# Patient Record
Sex: Female | Born: 1985 | Race: Black or African American | Hispanic: No | Marital: Married | Smoking: Never smoker
Health system: Southern US, Community
[De-identification: ages and names within clinical notes are randomized; demographics above are authoritative.]

## PROBLEM LIST (undated history)

## (undated) DIAGNOSIS — E611 Iron deficiency: Secondary | ICD-10-CM

## (undated) DIAGNOSIS — F419 Anxiety disorder, unspecified: Secondary | ICD-10-CM

## (undated) DIAGNOSIS — D352 Benign neoplasm of pituitary gland: Secondary | ICD-10-CM

## (undated) DIAGNOSIS — D649 Anemia, unspecified: Secondary | ICD-10-CM

## (undated) DIAGNOSIS — R7989 Other specified abnormal findings of blood chemistry: Secondary | ICD-10-CM

## (undated) DIAGNOSIS — J302 Other seasonal allergic rhinitis: Secondary | ICD-10-CM

## (undated) DIAGNOSIS — M7732 Calcaneal spur, left foot: Secondary | ICD-10-CM

## (undated) DIAGNOSIS — D573 Sickle-cell trait: Secondary | ICD-10-CM

## (undated) DIAGNOSIS — F32A Depression, unspecified: Secondary | ICD-10-CM

## (undated) DIAGNOSIS — K589 Irritable bowel syndrome without diarrhea: Secondary | ICD-10-CM

## (undated) DIAGNOSIS — E559 Vitamin D deficiency, unspecified: Secondary | ICD-10-CM

## (undated) HISTORY — PX: ACHILLES TENDON LENGTHENING: SUR826

## (undated) HISTORY — DX: Morbid (severe) obesity due to excess calories: E66.01

## (undated) HISTORY — PX: BLEPHAROPLASTY: SUR158

## (undated) HISTORY — DX: Sickle-cell trait: D57.3

## (undated) HISTORY — DX: Calcaneal spur, left foot: M77.32

## (undated) HISTORY — PX: HEEL SPUR RESECTION: SHX6410

## (undated) HISTORY — DX: Irritable bowel syndrome, unspecified: K58.9

## (undated) HISTORY — DX: Vitamin D deficiency, unspecified: E55.9

## (undated) HISTORY — PX: OTHER SURGICAL HISTORY: SHX169

## (undated) HISTORY — DX: Other specified abnormal findings of blood chemistry: R79.89

## (undated) HISTORY — DX: Benign neoplasm of pituitary gland: D35.2

## (undated) HISTORY — DX: Anemia, unspecified: D64.9

## (undated) HISTORY — DX: Iron deficiency: E61.1

---

## 2013-07-25 ENCOUNTER — Encounter: Payer: Self-pay | Admitting: Internal Medicine

## 2014-01-04 HISTORY — PX: CHOLECYSTECTOMY: SHX55

## 2014-08-05 DIAGNOSIS — D352 Benign neoplasm of pituitary gland: Secondary | ICD-10-CM

## 2014-08-05 HISTORY — DX: Benign neoplasm of pituitary gland: D35.2

## 2016-06-06 ENCOUNTER — Encounter (HOSPITAL_COMMUNITY): Payer: Self-pay | Admitting: Emergency Medicine

## 2016-06-06 ENCOUNTER — Emergency Department (HOSPITAL_COMMUNITY)
Admission: EM | Admit: 2016-06-06 | Discharge: 2016-06-06 | Disposition: A | Discharge status: Home / Self Care | Payer: Self-pay | Attending: Emergency Medicine | Admitting: Emergency Medicine

## 2016-06-06 DIAGNOSIS — H81399 Other peripheral vertigo, unspecified ear: Secondary | ICD-10-CM | POA: Insufficient documentation

## 2016-06-06 LAB — I-STAT BETA HCG BLOOD, ED (MC, WL, AP ONLY): I-stat hCG, quantitative: <5 m[IU]/mL (ref <5)

## 2016-06-06 MED ORDER — MECLIZINE HCL 25 MG PO TABS: 25.0000 mg | ORAL_TABLET | Freq: Three times a day (TID) | ORAL | 0 refills | Status: DC | PRN

## 2016-06-06 MED ORDER — MECLIZINE HCL 25 MG PO TABS
50.0000 mg | ORAL_TABLET | Freq: Once | ORAL | Status: AC
  Administered 2016-06-06: 50 mg via ORAL
  Filled 2016-06-06: qty 2

## 2016-06-06 NOTE — ED Notes (Signed)
Pt from home with complaints of dizziness that began today, but got worse when she got off work around 1300. Pt denies symptoms while she is sitting. Pt states when she walks the room feels like it is spinning, but she is unable to identify a direction that the room is spinning. Pt thinks she may be pregnant.

## 2016-06-06 NOTE — Discharge Instructions (Signed)
Do not drive until your symptoms are resolved. Get help right away if: You have difficulty moving or speaking. You are always dizzy. You faint. You develop severe headaches. You have weakness in your hands, arms, or legs. You have changes in your hearing or vision. You develop a stiff neck. You develop sensitivity to light.

## 2016-06-06 NOTE — ED Provider Notes (Signed)
[facility] DEPT Provider Note   CSN: [id number] Arrival date & time: 06/06/16  1821     History   Chief Complaint Chief Complaint  Patient presents with  . Dizziness    HPI Ana Padilla is a 31 y.o. female who presents with sxs of vertigo. She has never had anything like this before. She had onset of positional vertigo at about 1:00PM that has been progressively worsening since then. Symptoms are improved when lying flat worse with sitting up or standing. She has nausea without vomiting and has been able to ambulate.  HPI  History reviewed. No pertinent past medical history.  There are no active problems to display for this patient.   Past Surgical History:  Procedure Laterality Date  . CHOLECYSTECTOMY      OB History    No data available       Home Medications    Prior to Admission medications   Not on File    Family History No family history on file.  Social History Social History  Substance Use Topics  . Smoking status: Never Smoker  . Smokeless tobacco: Never Used  . Alcohol use Yes     Comment: socially      Allergies   Patient has no known allergies.   Review of Systems Review of Systems  Ten systems reviewed and are negative for acute change, except as noted in the HPI.   Physical Exam Updated Vital Signs BP (!) 139/95 (BP Location: Left Arm)   Pulse 75   Temp 98.5 F (36.9 C) (Oral)   Resp 16   Ht 5\' 7"  (1.702 m)   Wt (!) 156.5 kg (345 lb)   LMP 05/24/2016   SpO2 99%   BMI 54.03 kg/m   Physical Exam  Constitutional: She is oriented to person, place, and time. She appears well-developed and well-nourished. No distress.  HENT:  Head: Normocephalic and atraumatic.  Eyes: Conjunctivae are normal. No scleral icterus.  Neck: Normal range of motion.  Cardiovascular: Normal rate, regular rhythm and normal heart sounds.  Exam reveals no gallop and no friction rub.   No murmur heard. Pulmonary/Chest: Effort normal and breath  sounds normal. No respiratory distress.  Abdominal: Soft. Bowel sounds are normal. She exhibits no distension and no mass. There is no tenderness. There is no guarding.  Neurological: She is alert and oriented to person, place, and time.  Speech is clear and goal oriented, follows commands Major Cranial nerves without deficit, no facial droop Normal strength in upper and lower extremities bilaterally including dorsiflexion and plantar flexion, strong and equal grip strength Sensation normal to light and sharp touch Moves extremities without ataxia, coordination intact Normal finger to nose and rapid alternating movements Neg romberg, no pronator drift Normal gait Normal heel-shin and balance   Skin: Skin is warm and dry. She is not diaphoretic.  Psychiatric: Her behavior is normal.  Nursing note and vitals reviewed.    ED Treatments / Results  Labs (all labs ordered are listed, but only abnormal results are displayed) Labs Reviewed  I-STAT BETA HCG BLOOD, ED ([facility], [facility], [facility] ONLY)    EKG  EKG Interpretation None       Radiology No results found.  Procedures Procedures (including critical care time)  Medications Ordered in ED Medications - No data to display   Initial Impression / Assessment and Plan / ED Course  I have reviewed the triage vital signs and the nursing notes.  Pertinent labs & imaging results that  were available during my care of the patient were reviewed by me and considered in my medical decision making (see chart for details).     Patient with positional vertigo greatly improved after administration of meclizine. Patient's EKG negative for A. fib. I have extremely low suspicion for central cause is her vertigo is positional in nature. She also has very low risk factors for stroke. Patient is able to ambulate in the emergency department. No vomiting. She continues to have a slight sensation of vertigo with movement of the head off the changing of position  from lying to standing. Patient feels greatly improved. She will be discharged with meclizine. I discussed return precautions. She appears safe for discharge at this time  Final Clinical Impressions(s) / ED Diagnoses   Final diagnoses:  Peripheral vertigo, unspecified laterality    New Prescriptions New Prescriptions   No medications on file     Margarita Mail, PA-C 06/06/16 [place], [place], MD 06/11/16 [phone]

## 2016-09-11 DIAGNOSIS — H52223 Regular astigmatism, bilateral: Secondary | ICD-10-CM | POA: Diagnosis not present

## 2016-09-20 ENCOUNTER — Ambulatory Visit (INDEPENDENT_AMBULATORY_CARE_PROVIDER_SITE_OTHER): Payer: 59 | Admitting: Family Medicine

## 2016-09-20 ENCOUNTER — Other Ambulatory Visit (INDEPENDENT_AMBULATORY_CARE_PROVIDER_SITE_OTHER): Payer: 59

## 2016-09-20 ENCOUNTER — Ambulatory Visit: Payer: 59 | Admitting: Women's Health

## 2016-09-20 ENCOUNTER — Ambulatory Visit: Payer: Self-pay | Admitting: Family Medicine

## 2016-09-20 ENCOUNTER — Encounter: Payer: Self-pay | Admitting: Family Medicine

## 2016-09-20 VITALS — BP 110/80 | HR 86 | Ht 67.0 in | Wt 354.4 lb

## 2016-09-20 DIAGNOSIS — R635 Abnormal weight gain: Secondary | ICD-10-CM | POA: Diagnosis not present

## 2016-09-20 DIAGNOSIS — R300 Dysuria: Secondary | ICD-10-CM | POA: Diagnosis not present

## 2016-09-20 DIAGNOSIS — K580 Irritable bowel syndrome with diarrhea: Secondary | ICD-10-CM | POA: Diagnosis not present

## 2016-09-20 DIAGNOSIS — Z131 Encounter for screening for diabetes mellitus: Secondary | ICD-10-CM | POA: Diagnosis not present

## 2016-09-20 DIAGNOSIS — D649 Anemia, unspecified: Secondary | ICD-10-CM

## 2016-09-20 DIAGNOSIS — Z13220 Encounter for screening for lipoid disorders: Secondary | ICD-10-CM | POA: Diagnosis not present

## 2016-09-20 DIAGNOSIS — Z8639 Personal history of other endocrine, nutritional and metabolic disease: Secondary | ICD-10-CM

## 2016-09-20 DIAGNOSIS — N760 Acute vaginitis: Secondary | ICD-10-CM

## 2016-09-20 DIAGNOSIS — Z7689 Persons encountering health services in other specified circumstances: Secondary | ICD-10-CM

## 2016-09-20 DIAGNOSIS — Z86018 Personal history of other benign neoplasm: Secondary | ICD-10-CM

## 2016-09-20 LAB — CBC WITH DIFFERENTIAL/PLATELET
Basophils Absolute: 0 10*3/uL (ref 0.0–0.1)
Basophils Relative: 0.3 % (ref 0.0–3.0)
Eosinophils Absolute: 0.2 10*3/uL (ref 0.0–0.7)
Eosinophils Relative: 2.8 % (ref 0.0–5.0)
HCT: 31.8 % — ABNORMAL LOW (ref 36.0–46.0)
Hemoglobin: 9.9 g/dL — ABNORMAL LOW (ref 12.0–15.0)
Lymphocytes Relative: 21.2 % (ref 12.0–46.0)
Lymphs Abs: 1.6 10*3/uL (ref 0.7–4.0)
MCHC: 31.3 g/dL (ref 30.0–36.0)
MCV: 62.7 fl — ABNORMAL LOW (ref 78.0–100.0)
Monocytes Absolute: 0.4 10*3/uL (ref 0.1–1.0)
Monocytes Relative: 5.9 % (ref 3.0–12.0)
Neutro Abs: 5.2 10*3/uL (ref 1.4–7.7)
Neutrophils Relative %: 69.8 % (ref 43.0–77.0)
Platelets: 445 10*3/uL — ABNORMAL HIGH (ref 150.0–400.0)
RBC: 5.07 Mil/uL (ref 3.87–5.11)
RDW: 17.7 % — ABNORMAL HIGH (ref 11.5–15.5)
WBC: 7.5 10*3/uL (ref 4.0–10.5)

## 2016-09-20 LAB — COMPREHENSIVE METABOLIC PANEL
ALT: 12 U/L (ref 0–35)
AST: 15 U/L (ref 0–37)
Albumin: 3.9 g/dL (ref 3.5–5.2)
Alkaline Phosphatase: 65 U/L (ref 39–117)
BUN: 8 mg/dL (ref 6–23)
CO2: 29 mEq/L (ref 19–32)
Calcium: 9.3 mg/dL (ref 8.4–10.5)
Chloride: 100 mEq/L (ref 96–112)
Creatinine, Ser: 0.72 mg/dL (ref 0.40–1.20)
GFR: 122.82 mL/min (ref >60.00)
Glucose, Bld: 96 mg/dL (ref 70–99)
Potassium: 3.9 mEq/L (ref 3.5–5.1)
Sodium: 138 mEq/L (ref 135–145)
Total Bilirubin: 0.4 mg/dL (ref 0.2–1.2)
Total Protein: 7.3 g/dL (ref 6.0–8.3)

## 2016-09-20 LAB — LIPID PANEL
Cholesterol: 198 mg/dL (ref 0–200)
HDL: 79.4 mg/dL (ref >39.00)
LDL Cholesterol: 92 mg/dL (ref 0–99)
NonHDL: 118.57
Total CHOL/HDL Ratio: 2
Triglycerides: 134 mg/dL (ref 0.0–149.0)
VLDL: 26.8 mg/dL (ref 0.0–40.0)

## 2016-09-20 LAB — POC URINALSYSI DIPSTICK (AUTOMATED)
Bilirubin, UA: NEGATIVE
Blood, UA: NEGATIVE
Glucose, UA: NEGATIVE
Ketones, UA: NEGATIVE
Leukocytes, UA: NEGATIVE
Nitrite, UA: NEGATIVE
Protein, UA: NEGATIVE
Spec Grav, UA: >=1.03 — AB (ref 1.010–1.025)
Urobilinogen, UA: 0.2 E.U./dL
pH, UA: 5.5 (ref 5.0–8.0)

## 2016-09-20 LAB — T4, FREE: Free T4: 0.68 ng/dL (ref 0.60–1.60)

## 2016-09-20 LAB — VITAMIN D 25 HYDROXY (VIT D DEFICIENCY, FRACTURES): VITD: 20.41 ng/mL — ABNORMAL LOW (ref 30.00–100.00)

## 2016-09-20 LAB — HEMOGLOBIN A1C: Hgb A1c MFr Bld: 5.6 % (ref 4.6–6.5)

## 2016-09-20 LAB — VITAMIN B12: Vitamin B-12: 265 pg/mL (ref 211–911)

## 2016-09-20 LAB — FOLATE: Folate: 6.4 ng/mL (ref >5.9)

## 2016-09-20 LAB — TSH: TSH: 1.19 u[IU]/mL (ref 0.35–4.50)

## 2016-09-20 MED ORDER — PROBIOTIC DAILY PO CAPS: 1.0000 | ORAL_CAPSULE | Freq: Every day | ORAL | 3 refills | Status: DC

## 2016-09-20 MED ORDER — FLUCONAZOLE 150 MG PO TABS: 150.0000 mg | ORAL_TABLET | Freq: Once | ORAL | 0 refills | Status: AC

## 2016-09-20 MED ORDER — METRONIDAZOLE 500 MG PO TABS: 500.0000 mg | ORAL_TABLET | Freq: Two times a day (BID) | ORAL | 0 refills | Status: AC

## 2016-09-20 NOTE — Progress Notes (Signed)
Patient presents to clinic today to establish care and follow up on chronic conditions.  SUBJECTIVE: PMH: Pt is a 31 yo AAF with pmh sig for Pituitary adenoma, anemia, IBS-D, and chronic yeast/BV infections.   Pt was formerly seen by [facility] in [place].    Pituitary adenoma: -Diagnosed in 2017? -Had increased prolactin. -Formerly on Cabergoline -Denies visual disturbances, headaches, nausea vomiting, increased growth.  Chronic vaginitis -endorses frequent BV/yeast infections. 1 q 3 months. -Endorses current vaginal discomfort, mild itching and malodorous d/c. -Denies burning, frequent urination, abd pain, cottage cheese like d/c -was taking a probiotic daily with some relief. -Endorses wearing cotton panties, is sexually active with 1 partner., denies use of sex toys. -Has Rx for metronidazole gel and Diflucan from previous provider.   MetroGel was too expensive at pharmacy.  H/o chronic anemia: -on BID iron supplementation.  Tries to drink a glass of orage juice with pills -issues noted s/p cholecystectomy -No issues with constipation, palpitations -Ask about possibility of Iron infusion as levels stay low and extremely low during menses.  IBS-D: -asking for a referral to GI -not currently on meds.  Weight concern: -pt wants to lose wt. -gained 40lbs since moving here  -was exercising for 2 weeks then stopped 2/2 recent weather. -trying to drink more water.  Allergies: -Pt thinks has allergy to the pesticides on fresh fruit- itchy throat and ears.  PSurgHx: -cholecystectomy  Social hx: Pt recently moved from [place].  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  Pt is currently in a monogamous relationship.  Endorses condom use.  Pt endorses heavy regular menses.  Pt has tried numerous birth control options all with continued bleeding.  Birth control ortho novo, ortho tricyclene, the patch, Loestrin, Seasonique, and IUD 2. Pt denies tobacco,  EtOH, Drug use.    FMHx: -pt was adopted.  States mother has Sickle Cell.   Past Medical History:  Diagnosis Date  . Adenoma of pituitary ([place]) 08/2014    Past Surgical History:  Procedure Laterality Date  . CHOLECYSTECTOMY      Current Outpatient Prescriptions on File Prior to Visit  Medication Sig Dispense Refill  . cetirizine (ZYRTEC) 10 MG tablet Take 10 mg by mouth daily at 10 pm.     No current facility-administered medications on file prior to visit.     Allergies  Allergen Reactions  . Food     Seasonal Allergies & fresh fruit---itchy mouth&nose     Family History  Problem Relation Age of Onset  . Sickle cell trait Mother   . Heart disease Father     Social History   Social History  . Marital status: Single    Spouse name: N/A  . Number of children: N/A  . Years of education: N/A   Occupational History  . Not on file.   Social History Main Topics  . Smoking status: Never Smoker  . Smokeless tobacco: Never Used  . Alcohol use Yes     Comment: socially   . Drug use: No  . Sexual activity: Not on file   Other Topics Concern  . Not on file   Social History Narrative  . No narrative on file    ROS  General: Denies fever, chills, night sweats, changes in weight, changes in appetite.  +anemia, increased wt HEENT: Denies headaches, ear pain, changes in vision, rhinorrhea, sore throat CV: Denies CP, palpitations, SOB, orthopnea Pulm: Denies SOB, cough, wheezing GI: Denies abdominal  pain, nausea, vomiting, constipation  +IBS with diarrhea GU: Denies dysuria, hematuria, frequency, vaginal discharge Msk: Denies muscle cramps, joint pains Neuro: Denies weakness, numbness, tingling Skin: Denies rashes, bruising Psych: Denies depression, anxiety, hallucinations   BP 110/80 (BP Location: Left Arm, Patient Position: Sitting, Cuff Size: Large)   Pulse 86   Ht 5\' 7"  (1.702 m)   Wt (!) 354 lb 6.4 oz (160.8 kg)   LMP 06/08/2016 (Approximate)   BMI  55.51 kg/m   Physical Exam  Gen. Pleasant, obese, well developed, well-nourished, in NAD. HEENT - [place]/AT, PERRL, no scleral icterus, no nasal drainage, pharynx without erythema or exudate. Neck: No JVD, no thyromegaly, no carotid bruits Lungs: no accessory muscle use, CTAB, no wheezes, rales or rhonchi Cardiovascular: RRR, No r/g/m, no peripheral edema Abdomen: BS present, soft, nontender,nondistended, no hepatosplenomegaly Musculoskeletal: No deformities, moves all four extremities, no cyanosis or clubbing, normal tone Neuro:  A&Ox3, CN II-XII intact, normal gait Skin:  Warm, dry, intact, no lesions Psych: normal affect, mood appropriate  Recent Results (from the past 2160 hour(s))  POCT Urinalysis Dipstick (Automated)     Status: Abnormal   Collection Time: 09/20/16 10:09 AM  Result Value Ref Range   Color, UA yellow    Clarity, UA clear    Glucose, UA neg    Bilirubin, UA neg    Ketones, UA neg    Spec Grav, UA >=1.030 (A) 1.010 - 1.025   Blood, UA neg    pH, UA 5.5 5.0 - 8.0   Protein, UA neg    Urobilinogen, UA 0.2 0.2 or 1.0 E.U./dL   Nitrite, UA neg    Leukocytes, UA Negative Negative  Hemoglobin A1c     Status: None   Collection Time: 09/20/16 11:10 AM  Result Value Ref Range   Hgb A1c MFr Bld 5.6 4.6 - 6.5 %    Comment: Glycemic Control Guidelines for People with Diabetes:Non Diabetic:  <6%Goal of Therapy: <7%Additional Action Suggested:  >8%   Lipid panel     Status: None   Collection Time: 09/20/16 11:10 AM  Result Value Ref Range   Cholesterol 198 0 - 200 mg/dL    Comment: ATP III Classification       Desirable:  < 200 mg/dL               Borderline High:  200 - 239 mg/dL          High:  > = 240 mg/dL   Triglycerides 134.0 0.0 - 149.0 mg/dL    Comment: Normal:  <150 mg/dLBorderline High:  150 - 199 mg/dL   HDL 79.40 >39.00 mg/dL   VLDL 26.8 0.0 - 40.0 mg/dL   LDL Cholesterol 92 0 - 99 mg/dL   Total CHOL/HDL Ratio 2     Comment:                Men           Women1/2 Average Risk     3.4          3.3Average Risk          5.0          4.42X Average Risk          9.6          7.13X Average Risk          15.0          11.0  NonHDL 118.57     Comment: NOTE:  Non-HDL goal should be 30 mg/dL higher than patient's LDL goal (i.e. LDL goal of < 70 mg/dL, would have non-HDL goal of < 100 mg/dL)  CBC with Differential/Platelet     Status: Abnormal   Collection Time: 09/20/16 11:10 AM  Result Value Ref Range   WBC 7.5 4.0 - 10.5 K/uL   RBC 5.07 3.87 - 5.11 Mil/uL   Hemoglobin 9.9 (L) 12.0 - 15.0 g/dL   HCT 31.8 (L) 36.0 - 46.0 %   MCV 62.7 Repeated and verified X2. (L) 78.0 - 100.0 fl   MCHC 31.3 30.0 - 36.0 g/dL   RDW 17.7 (H) 11.5 - 15.5 %   Platelets 445.0 (H) 150.0 - 400.0 K/uL   Neutrophils Relative % 69.8 43.0 - 77.0 %   Lymphocytes Relative 21.2 12.0 - 46.0 %   Monocytes Relative 5.9 3.0 - 12.0 %   Eosinophils Relative 2.8 0.0 - 5.0 %   Basophils Relative 0.3 0.0 - 3.0 %   Neutro Abs 5.2 1.4 - 7.7 K/uL   Lymphs Abs 1.6 0.7 - 4.0 K/uL   Monocytes Absolute 0.4 0.1 - 1.0 K/uL   Eosinophils Absolute 0.2 0.0 - 0.7 K/uL   Basophils Absolute 0.0 0.0 - 0.1 K/uL  Folate     Status: None   Collection Time: 09/20/16 11:10 AM  Result Value Ref Range   Folate 6.4 >5.9 ng/mL  Vitamin B12     Status: None   Collection Time: 09/20/16 11:10 AM  Result Value Ref Range   Vitamin B-12 265 211 - 911 pg/mL  Comprehensive metabolic panel     Status: None   Collection Time: 09/20/16 11:10 AM  Result Value Ref Range   Sodium 138 135 - 145 mEq/L   Potassium 3.9 3.5 - 5.1 mEq/L   Chloride 100 96 - 112 mEq/L   CO2 29 19 - 32 mEq/L   Glucose, Bld 96 70 - 99 mg/dL   BUN 8 6 - 23 mg/dL   Creatinine, Ser 0.72 0.40 - 1.20 mg/dL   Total Bilirubin 0.4 0.2 - 1.2 mg/dL   Alkaline Phosphatase 65 39 - 117 U/L   AST 15 0 - 37 U/L   ALT 12 0 - 35 U/L   Total Protein 7.3 6.0 - 8.3 g/dL   Albumin 3.9 3.5 - 5.2 g/dL   Calcium 9.3 8.4 - 10.5 mg/dL    GFR 122.82 >60.00 mL/min  Vitamin D, 25-hydroxy     Status: Abnormal   Collection Time: 09/20/16 11:10 AM  Result Value Ref Range   VITD 20.41 (L) 30.00 - 100.00 ng/mL    Assessment/Plan: History of pituitary adenoma -will obtain previous records for imaging -may need to restart cabergoline.  Will await prolactin level  - Plan: Prolactin, Ambulatory referral to Endocrinology  Dysuria  - Plan: POCT Urinalysis Dipstick (Automated)  Acute vaginitis - discussed hygiene -restart probiotic - Plan: metroNIDAZOLE (FLAGYL) 500 MG tablet, fluconazole (DIFLUCAN) 150 MG tablet, Probiotic Product (PROBIOTIC DAILY) CAPS  Anemia, unspecified type  - Plan: CBC with Differential/Platelet, Folate, Vitamin B12, Vitamin D, 25-hydroxy, Vitamin D, 25-hydroxy  Screening cholesterol level  - Plan: Lipid panel, Comprehensive metabolic panel  Screening for diabetes mellitus -given increased wt and frequent infections  - Plan: Hemoglobin A1c  Encounter to establish care with new doctor -records release  Irritable bowel syndrome with diarrhea  -discussed avoiding triggers/limiting stress -restart probiotic - Plan: Ambulatory referral to Gastroenterology  Weight gain  -  40 lb wt gain sine moving to the area. -BMI 55.479 -discussed increasing intake of vegetables, increasing physical activity, and water intake. - Plan: TSH, T4, Free

## 2016-09-20 NOTE — Patient Instructions (Addendum)
We have ordered labs at this visit. It can take up to 1-2 weeks for results and processing. If results require follow up or explanation, we will call you with instructions. Clinically stable results will be released to your [facility] or sent to you via mail. If you have not heard from [place] or cannot find your results in [facility] in 2 weeks please contact our office at [phone].   Anemia, Nonspecific Anemia is a condition in which the concentration of red blood cells or hemoglobin in the blood is below normal. Hemoglobin is a substance in red blood cells that carries oxygen to the tissues of the body. Anemia results in not enough oxygen reaching these tissues. What are the causes? Common causes of anemia include:  Excessive bleeding. Bleeding may be internal or external. This includes excessive bleeding from periods (in women) or from the intestine.  Poor nutrition.  Chronic kidney, thyroid, and liver disease.  Bone marrow disorders that decrease red blood cell production.  Cancer and treatments for cancer.  HIV, AIDS, and their treatments.  Spleen problems that increase red blood cell destruction.  Blood disorders.  Excess destruction of red blood cells due to infection, medicines, and autoimmune disorders.  What are the signs or symptoms?  Minor weakness.  Dizziness.  Headache.  Palpitations.  Shortness of breath, especially with exercise.  Paleness.  Cold sensitivity.  Indigestion.  Nausea.  Difficulty sleeping.  Difficulty concentrating. Symptoms may occur suddenly or they may develop slowly. How is this diagnosed? Additional blood tests are often needed. These help your health care provider determine the best treatment. Your health care provider will check your stool for blood and look for other causes of blood loss. How is this treated? Treatment varies depending on the cause of the anemia. Treatment can include:  Supplements of iron, vitamin V77, or folic  acid.  Hormone medicines.  A blood transfusion. This may be needed if blood loss is severe.  Hospitalization. This may be needed if there is significant continual blood loss.  Dietary changes.  Spleen removal.  Follow these instructions at home: Keep all follow-up appointments. It often takes many weeks to correct anemia, and having your health care provider check on your condition and your response to treatment is very important. Get help right away if:  You develop extreme weakness, shortness of breath, or chest pain.  You become dizzy or have trouble concentrating.  You develop heavy vaginal bleeding.  You develop a rash.  You have bloody or black, tarry stools.  You faint.  You vomit up blood.  You vomit repeatedly.  You have abdominal pain.  You have a fever or persistent symptoms for more than 2-3 days.  You have a fever and your symptoms suddenly get worse.  You are dehydrated. This information is not intended to replace advice given to you by your health care provider. Make sure you discuss any questions you have with your health care provider. Document Released: 01/29/2004 Document Revised: 06/04/2015 Document Reviewed: 06/16/2012 Elsevier Interactive Patient Education  2017 [employer]  Bacterial Vaginosis Bacterial vaginosis is an infection of the vagina. It happens when too many germs (bacteria) grow in the vagina. This infection puts you at risk for infections from sex (STIs). Treating this infection can lower your risk for some STIs. You should also treat this if you are pregnant. It can cause your baby to be born early. Follow these instructions at home: Medicines  Take over-the-counter and prescription medicines only as told by  your doctor.  Take or use your antibiotic medicine as told by your doctor. Do not stop taking or using it even if you start to feel better. General instructions  If you your sexual partner is a woman, tell her that you  have this infection. She needs to get treatment if she has symptoms. If you have a female partner, he does not need to be treated.  During treatment: ? Avoid sex. ? Do not douche. ? Avoid alcohol as told. ? Avoid breastfeeding as told.  Drink enough fluid to keep your pee (urine) clear or pale yellow.  Keep your vagina and butt (rectum) clean. ? Wash the area with warm water every day. ? Wipe from front to back after you use the toilet.  Keep all follow-up visits as told by your doctor. This is important. Preventing this condition  Do not douche.  Use only warm water to wash around your vagina.  Use protection when you have sex. This includes: ? Latex condoms. ? Dental dams.  Limit how many people you have sex with. It is best to only have sex with the same person (be monogamous).  Get tested for STIs. Have your partner get tested.  Wear underwear that is cotton or lined with cotton.  Avoid tight pants and pantyhose. This is most important in summer.  Do not use any products that have nicotine or tobacco in them. These include cigarettes and e-cigarettes. If you need help quitting, ask your doctor.  Do not use illegal drugs.  Limit how much alcohol you drink. Contact a doctor if:  Your symptoms do not get better, even after you are treated.  You have more discharge or pain when you pee (urinate).  You have a fever.  You have pain in your belly (abdomen).  You have pain with sex.  Your bleed from your vagina between periods. Summary  This infection happens when too many germs (bacteria) grow in the vagina.  Treating this condition can lower your risk for some infections from sex (STIs).  You should also treat this if you are pregnant. It can cause early (premature) birth.  Do not stop taking or using your antibiotic medicine even if you start to feel better. This information is not intended to replace advice given to you by your health care provider. Make sure  you discuss any questions you have with your health care provider. Document Released: 09/30/2007 Document Revised: 09/06/2015 Document Reviewed: 09/06/2015 Elsevier Interactive Patient Education  2017 [employer].

## 2016-09-21 LAB — PROLACTIN: Prolactin: 54.6 ng/mL — ABNORMAL HIGH

## 2016-09-23 ENCOUNTER — Other Ambulatory Visit: Payer: Self-pay | Admitting: Family Medicine

## 2016-09-23 ENCOUNTER — Encounter: Payer: Self-pay | Admitting: Family Medicine

## 2016-09-23 DIAGNOSIS — E559 Vitamin D deficiency, unspecified: Secondary | ICD-10-CM

## 2016-09-23 MED ORDER — VITAMIN D (ERGOCALCIFEROL) 1.25 MG (50000 UNIT) PO CAPS: 50000.0000 [IU] | ORAL_CAPSULE | ORAL | 0 refills | Status: DC

## 2016-09-24 ENCOUNTER — Telehealth: Payer: Self-pay | Admitting: Family Medicine

## 2016-09-24 NOTE — Telephone Encounter (Signed)
Pt would like results of labs. Please call back. °

## 2016-09-24 NOTE — Telephone Encounter (Signed)
Patient would like to know if you would place referral for Hematology? Patient states that normally after her cycles her hgb levels tend to decrease even lower. Please advise.

## 2016-09-27 ENCOUNTER — Other Ambulatory Visit: Payer: Self-pay | Admitting: Family Medicine

## 2016-09-27 DIAGNOSIS — D508 Other iron deficiency anemias: Secondary | ICD-10-CM

## 2016-09-27 NOTE — Telephone Encounter (Signed)
Left a VM for patient regarding referral for hematology. Nothing further needed

## 2016-09-28 DIAGNOSIS — Q103 Other congenital malformations of eyelid: Secondary | ICD-10-CM | POA: Diagnosis not present

## 2016-10-04 ENCOUNTER — Encounter: Payer: 59 | Admitting: Obstetrics and Gynecology

## 2016-10-08 ENCOUNTER — Encounter: Payer: Self-pay | Admitting: Internal Medicine

## 2016-10-08 ENCOUNTER — Telehealth: Payer: Self-pay | Admitting: Hematology and Oncology

## 2016-10-08 NOTE — Telephone Encounter (Signed)
Appt has been scheduled for the pt to see Dr. Lindi Adie on 10/23 at 345pm. Pt is an employee of [employer].

## 2016-10-12 ENCOUNTER — Ambulatory Visit: Payer: 59 | Admitting: Internal Medicine

## 2016-10-25 DIAGNOSIS — Q103 Other congenital malformations of eyelid: Secondary | ICD-10-CM | POA: Diagnosis not present

## 2016-10-26 ENCOUNTER — Encounter: Payer: 59 | Admitting: Hematology and Oncology

## 2016-10-26 ENCOUNTER — Encounter: Payer: 59 | Admitting: Nurse Practitioner

## 2016-10-26 ENCOUNTER — Telehealth: Payer: Self-pay | Admitting: Hematology

## 2016-10-26 ENCOUNTER — Encounter: Payer: 59 | Admitting: Hematology

## 2016-10-26 NOTE — Telephone Encounter (Signed)
Spoke with patient regarding appts that were rescheduled due to her having eye surgery.

## 2016-11-04 ENCOUNTER — Encounter: Payer: Self-pay | Admitting: Hematology

## 2016-11-04 ENCOUNTER — Ambulatory Visit (HOSPITAL_BASED_OUTPATIENT_CLINIC_OR_DEPARTMENT_OTHER): Payer: 59 | Admitting: Hematology

## 2016-11-04 DIAGNOSIS — N920 Excessive and frequent menstruation with regular cycle: Secondary | ICD-10-CM | POA: Diagnosis not present

## 2016-11-04 DIAGNOSIS — E669 Obesity, unspecified: Secondary | ICD-10-CM

## 2016-11-04 DIAGNOSIS — D500 Iron deficiency anemia secondary to blood loss (chronic): Secondary | ICD-10-CM | POA: Diagnosis not present

## 2016-11-04 NOTE — Progress Notes (Signed)
[place]  [phone] [phone]  Clinic New consult Note   Patient Care Team: [name], MD as PCP - General (Family Medicine) 11/04/2016  Referring physician: [name], MD  CHIEF COMPLAINTS/PURPOSE OF CONSULTATION:  Iron deficiency anemia   HISTORY OF PRESENTING ILLNESS:  Ana Padilla 31 y.o. 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 She recently moved to [facility] from [place] in April because of her boyfriend. She was followed for several medical conditions including anemia and h/o pituitary adenoma in that area. Overall, in her daily activities she does feel somewhat restricted due to her fatigue, but there are no other prohibitive symptoms. Of note, she is adopted and unsure of her father's medical history. She does note that her mother's side of the family does have leukemia, but otherwise she is unsure of their history as well. She does drink alcohol socially as well.  She recently established care with Dr Volanda Napoleon, Langley Adie, MD for her primary care. She had basic labs drawn which showed a Hgb level of 9.9, HCT of 31.8, MCV of 62.7, and increased platelets at 445.0K. Prolactin was also notable to be elevated at 54.6, endocrinology referral was placed. Additionally, Vitamin D level was low at 20.41 as well. She was started on Ergocalciferol 50000 IU every week for 8 weeks. She will begin OTC Vitamin D 1K-2K IU daily. She was subsequently referred into Hematology following this.   She states has been diagnosed with anemia for approximately 12 years now. She reports that her counts typically drop around the time of her menses, during which time she will become noticeably fatigued. Her menses have been irregular at baseline.  Her menses usually occur every 3-4 months and they are very heavy at baseline. She typically has to change her tampon every 30 minutes and does have the passage of clots as well.  The longest period has been 13 days and she reports that it will be consistently heavy until the last two days of her menses before it tapers off.   Additionally, she was noted to have a 40lbs weight gain at the beginning of the year which was rapid and unexpected. This was thought to be due to Cushing's syndrome, however, she was tested for this and it was negative.   Her lowest Hgb level in the past to her knowledge was 8.0, but she has never had to previously taken any blood transfusions. She has previously had one infusion of IV Iron and she notes that following this she began a menses and this was still very heavy. She is currently taking Vitamin D and iron supplement with OJ in the mornings, but she remained fatigue throughout the day. She also is currently taking Cabergoline for her elevated Prolactin and she has not noticed any changes in her fatigue related to this.   On review of systems, pt denies fever, chills, rash, mouth sores, weight loss, decreased appetite, urinary complaints. Denies pain. Pt denies abdominal pain, nausea, vomiting.   MEDICAL HISTORY:  Past Medical History:  Diagnosis Date  . Adenoma of pituitary ([place]) 08/2014    SURGICAL HISTORY: Past Surgical History:  Procedure Laterality Date  . CHOLECYSTECTOMY      SOCIAL HISTORY: Social History   Social History  . Marital status: Single    Spouse name: N/A  . Number of children: N/A  . Years of education: N/A   Occupational History  .  Not on file.   Social History Main Topics  . Smoking status: Never Smoker  . Smokeless tobacco: Never Used  . Alcohol use Yes     Comment: socially   . Drug use: No  . Sexual activity: Not on file   Other Topics Concern  . Not on file   Social History Narrative  . No narrative on file    FAMILY HISTORY: Family History  Problem Relation Age of Onset  . Sickle cell trait Mother   . Heart disease Father     ALLERGIES:  is allergic to food.  MEDICATIONS:    Current Outpatient Prescriptions  Medication Sig Dispense Refill  . cetirizine (ZYRTEC) 10 MG tablet Take 10 mg by mouth daily at 10 pm.    . Cholecalciferol (VITAMIN D PO) Take by mouth.    . Ferrous Sulfate (IRON SUPPLEMENT PO) Take by mouth.    . Probiotic Product (PROBIOTIC DAILY) CAPS Take 1 capsule by mouth daily. 90 capsule 3  . Vitamin D, Ergocalciferol, (DRISDOL) 50000 units CAPS capsule Take 1 capsule (50,000 Units total) by mouth every 7 (seven) days. 8 capsule 0   No current facility-administered medications for this visit.     REVIEW OF SYSTEMS:   Constitutional: Denies fevers, chills or abnormal night sweats (+) fatigue Eyes: Denies blurriness of vision, double vision or watery eyes Ears, nose, mouth, throat, and face: Denies mucositis or sore throat Respiratory: Denies cough, dyspnea or wheezes Cardiovascular: Denies palpitation, chest discomfort or lower extremity swelling Gastrointestinal:  Denies nausea, heartburn or change in bowel habits Skin: Denies abnormal skin rashes Lymphatics: Denies new lymphadenopathy or easy bruising Neurological:Denies numbness, tingling or new weaknesses Behavioral/Psych: Mood is stable, no new changes  All other systems were reviewed with the patient and are negative.  PHYSICAL EXAMINATION: ECOG PERFORMANCE STATUS: 1 - Symptomatic but completely ambulatory  Vitals:   11/04/16 1529  BP: (!) 133/94  Pulse: 81  Resp: 20  Temp: 98.9 F (37.2 C)   Filed Weights   11/04/16 1529  Weight: (!) 352 lb 14.4 oz (160.1 kg)    GENERAL:alert, no distress and comfortable SKIN: skin color, texture, turgor are normal, no rashes or significant lesions EYES: normal, conjunctiva are pink and non-injected, sclera clear OROPHARYNX:no exudate, no erythema and lips, buccal mucosa, and tongue normal  NECK: supple, thyroid normal size, non-tender, without nodularity LYMPH:  no palpable lymphadenopathy in the cervical, axillary or inguinal LUNGS:  clear to auscultation and percussion with normal breathing effort HEART: regular rate & rhythm and no murmurs and no lower extremity edema ABDOMEN:abdomen soft, non-tender and normal bowel sounds Musculoskeletal:no cyanosis of digits and no clubbing  PSYCH: alert & oriented x 3 with fluent speech NEURO: no focal motor/sensory deficits  LABORATORY DATA:  I have reviewed the data as listed CBC Latest Ref Rng & Units 09/20/2016  WBC 4.0 - 10.5 K/uL 7.5  Hemoglobin 12.0 - 15.0 g/dL 9.9(L)  Hematocrit 36.0 - 46.0 % 31.8(L)  Platelets 150.0 - 400.0 K/uL 445.0(H)    CMP Latest Ref Rng & Units 09/20/2016  Glucose 70 - 99 mg/dL 96  BUN 6 - 23 mg/dL 8  Creatinine 0.40 - 1.20 mg/dL 0.72  Sodium 135 - 145 mEq/L 138  Potassium 3.5 - 5.1 mEq/L 3.9  Chloride 96 - 112 mEq/L 100  CO2 19 - 32 mEq/L 29  Calcium 8.4 - 10.5 mg/dL 9.3  Total Protein 6.0 - 8.3 g/dL 7.3  Total Bilirubin 0.2 - 1.2 mg/dL 0.4  Alkaline Phos 39 - 117 U/L 65  AST 0 - 37 U/L 15  ALT 0 - 35 U/L 12   Results for SARHA, BARTELT (MRN [id number]) as of 11/04/2016 16:31  Ref. Range 09/20/2016 11:10  Folate Latest Ref Range: >5.9 ng/mL 6.4  Vitamin B12 Latest Ref Range: 211 - 911 pg/mL 265   RADIOGRAPHIC STUDIES: I have personally reviewed the radiological images as listed and agreed with the findings in the report. No results found.  ASSESSMENT No problem-specific Assessment & Plan notes found for this encounter.  This is a lvoely 31yo AA female who presents with anemia since teenager  1. Iron deficiency anemia, secondary to menorrhagia -I have reviewed the patient's labs in great detail with her today.  -Her CBC in September 2018 showed microcytic anemia with MCV 62.7, moderate anemia with hemoglobin 9.9, normal WBC, elevated platelet count at 440 5K.  She had a history of iron deficient anemia, likely secondary to her menorrhagia.  This is consistent with iron deficient anemia, I will repeat her iron level including  ferritin and iron studies to confirm.   -Giving the very low MCV, if her anemia does not resolve completely after IV iron, I will check hemoglobin electrophoresis, to rule out beta thalassemia.  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   -Her folate and B12 level were normal. -She has been taking oral iron ferrous sulfate once daily, tolerating well, however still has mild to moderate anemia, and significant fatigue. -We will treat her with IV Feraheme infusions once a week x2  -She will also continue taking ferrous sulfate and vitamin D supplements.  -I will follow up with her in 3 months with labs just prior to evaluate how she tolerated this.  -Labs in 6 weeks to evaluate her response to IV iron  2.  Obesity, menorrhagia, irregular menstrual period -She will follow-up with her gynecologist    3.  Pituitary adenoma and hyperprolactinemia  -She has appointment to see endocrinologist Dr. [name] next month   4. Obesity  -I encouraged her to eat healthy and exercise, and try to lose weight.  PLAN:  -IV Feraheme weekly x2 in the next 2-3 F/u in 68mo with lab a few days before -Lab in 6 weeks  All questions were answered. The patient knows to call the clinic with any problems, questions or concerns.    Truitt Merle, MD 11/04/16   This document serves as a record of services personally performed by Truitt Merle, MD. It was created on her behalf by Reola Mosher, a trained medical scribe. The creation of this record is based on the scribe's personal observations and the provider's statements to them. This document has been checked and approved by the attending provider.

## 2016-11-05 ENCOUNTER — Encounter: Payer: 59 | Admitting: Hematology

## 2016-11-08 ENCOUNTER — Telehealth: Payer: Self-pay | Admitting: Hematology

## 2016-11-08 NOTE — Telephone Encounter (Signed)
Added and changed some appts per 11/1 los.

## 2016-11-09 ENCOUNTER — Other Ambulatory Visit (HOSPITAL_BASED_OUTPATIENT_CLINIC_OR_DEPARTMENT_OTHER): Payer: 59

## 2016-11-09 DIAGNOSIS — N920 Excessive and frequent menstruation with regular cycle: Secondary | ICD-10-CM

## 2016-11-09 DIAGNOSIS — D500 Iron deficiency anemia secondary to blood loss (chronic): Secondary | ICD-10-CM | POA: Diagnosis not present

## 2016-11-09 LAB — CBC & DIFF AND RETIC
BASO%: 0.1 % (ref 0.0–2.0)
Basophils Absolute: 0 10*3/uL (ref 0.0–0.1)
EOS%: 3.4 % (ref 0.0–7.0)
Eosinophils Absolute: 0.3 10*3/uL (ref 0.0–0.5)
HCT: 32 % — ABNORMAL LOW (ref 34.8–46.6)
HGB: 9.7 g/dL — ABNORMAL LOW (ref 11.6–15.9)
Immature Retic Fract: 20 % — ABNORMAL HIGH (ref 1.60–10.00)
LYMPH%: 26.7 % (ref 14.0–49.7)
MCH: 19.2 pg — ABNORMAL LOW (ref 25.1–34.0)
MCHC: 30.3 g/dL — ABNORMAL LOW (ref 31.5–36.0)
MCV: 63.5 fL — ABNORMAL LOW (ref 79.5–101.0)
MONO#: 0.4 10*3/uL (ref 0.1–0.9)
MONO%: 4.7 % (ref 0.0–14.0)
NEUT#: 4.8 10*3/uL (ref 1.5–6.5)
NEUT%: 65.1 % (ref 38.4–76.8)
Platelets: 390 10*3/uL (ref 145–400)
RBC: 5.04 10*6/uL (ref 3.70–5.45)
RDW: 19.4 % — ABNORMAL HIGH (ref 11.2–14.5)
Retic %: 1.65 % (ref 0.70–2.10)
Retic Ct Abs: 83.16 10*3/uL (ref 33.70–90.70)
WBC: 7.4 10*3/uL (ref 3.9–10.3)
lymph#: 2 10*3/uL (ref 0.9–3.3)

## 2016-11-09 LAB — IRON AND TIBC
%SAT: 10 % — ABNORMAL LOW (ref 21–57)
Iron: 36 ug/dL — ABNORMAL LOW (ref 41–142)
TIBC: 358 ug/dL (ref 236–444)
UIBC: 322 ug/dL (ref 120–384)

## 2016-11-09 LAB — FERRITIN: Ferritin: 32 ng/ml (ref 9–269)

## 2016-11-12 ENCOUNTER — Ambulatory Visit: Payer: 59

## 2016-11-13 ENCOUNTER — Ambulatory Visit: Payer: 59

## 2016-11-19 ENCOUNTER — Ambulatory Visit: Payer: 59

## 2016-11-19 ENCOUNTER — Ambulatory Visit (HOSPITAL_BASED_OUTPATIENT_CLINIC_OR_DEPARTMENT_OTHER): Payer: 59

## 2016-11-19 VITALS — BP 105/63 | HR 82 | Temp 98.7°F | Resp 16

## 2016-11-19 DIAGNOSIS — D500 Iron deficiency anemia secondary to blood loss (chronic): Secondary | ICD-10-CM | POA: Diagnosis not present

## 2016-11-19 DIAGNOSIS — N920 Excessive and frequent menstruation with regular cycle: Secondary | ICD-10-CM

## 2016-11-19 MED ORDER — SODIUM CHLORIDE 0.9 % IV SOLN
Freq: Once | INTRAVENOUS | Status: AC
  Administered 2016-11-19: 08:00:00 via INTRAVENOUS

## 2016-11-19 MED ORDER — SODIUM CHLORIDE 0.9 % IV SOLN
510.0000 mg | Freq: Once | INTRAVENOUS | Status: AC
  Administered 2016-11-19: 510 mg via INTRAVENOUS
  Filled 2016-11-19: qty 17

## 2016-11-19 NOTE — Patient Instructions (Signed)

## 2016-11-20 ENCOUNTER — Ambulatory Visit: Payer: 59

## 2016-11-26 ENCOUNTER — Ambulatory Visit (HOSPITAL_BASED_OUTPATIENT_CLINIC_OR_DEPARTMENT_OTHER): Payer: 59

## 2016-11-26 VITALS — BP 124/75 | HR 72 | Temp 98.4°F | Resp 16

## 2016-11-26 DIAGNOSIS — D500 Iron deficiency anemia secondary to blood loss (chronic): Secondary | ICD-10-CM

## 2016-11-26 DIAGNOSIS — N920 Excessive and frequent menstruation with regular cycle: Secondary | ICD-10-CM | POA: Diagnosis not present

## 2016-11-26 MED ORDER — SODIUM CHLORIDE 0.9 % IV SOLN
Freq: Once | INTRAVENOUS | Status: AC
  Administered 2016-11-26: 16:00:00 via INTRAVENOUS

## 2016-11-26 MED ORDER — SODIUM CHLORIDE 0.9 % IV SOLN
510.0000 mg | Freq: Once | INTRAVENOUS | Status: AC
  Administered 2016-11-26: 510 mg via INTRAVENOUS
  Filled 2016-11-26: qty 17

## 2016-11-26 NOTE — Patient Instructions (Signed)

## 2016-12-03 ENCOUNTER — Ambulatory Visit: Payer: 59 | Admitting: Internal Medicine

## 2016-12-06 ENCOUNTER — Encounter: Payer: Self-pay | Admitting: Family Medicine

## 2016-12-06 ENCOUNTER — Ambulatory Visit (INDEPENDENT_AMBULATORY_CARE_PROVIDER_SITE_OTHER): Payer: 59 | Admitting: Family Medicine

## 2016-12-06 VITALS — BP 100/70 | HR 78 | Temp 98.3°F | Wt 352.1 lb

## 2016-12-06 DIAGNOSIS — D352 Benign neoplasm of pituitary gland: Secondary | ICD-10-CM

## 2016-12-06 MED ORDER — CABERGOLINE 0.5 MG PO TABS: 0.2500 mg | ORAL_TABLET | ORAL | 2 refills | Status: DC

## 2016-12-06 NOTE — Progress Notes (Signed)
Subjective:    Patient ID: Ana Padilla, female    DOB: Aug 16, 1985, 31 y.o.   MRN: [id number]  No chief complaint on file.   HPI Patient was seen today for f/u on pituritary adenoma.  Pt was formerly on Cabergoline .5 mg twice per week.  Pt has been out of medicine x 2 months as she has been awaiting appt with Endo.  Pt states she is starting to "feel the increased prolactin" in her body.  She denies galactorrhea.  Pt states she still feels tired, s/p 2 IV iron infusions.  Her hgb has remained in the upper 9s.  Pt is not currently having heavy AUB.  Her last period was months ago.  Past Medical History:  Diagnosis Date  . Adenoma of pituitary ([place]) 08/2014    Allergies  Allergen Reactions  . Food     Seasonal Allergies & fresh fruit---itchy mouth&nose     ROS General: Denies fever, chills, night sweats, changes in weight, changes in appetite  +tired/fatigue HEENT: Denies headaches, ear pain, changes in vision, rhinorrhea, sore throat CV: Denies CP, palpitations, SOB, orthopnea Pulm: Denies SOB, cough, wheezing GI: Denies abdominal pain, nausea, vomiting, diarrhea, constipation GU: Denies dysuria, hematuria, frequency, vaginal discharge Msk: Denies muscle cramps, joint pains Neuro: Denies weakness, numbness, tingling Skin: Denies rashes, bruising Psych: Denies depression, anxiety, hallucinations     Objective:    Blood pressure 100/70, pulse 78, temperature 98.3 F (36.8 C), temperature source Oral, weight (!) 352 lb 1.6 oz (159.7 kg).   Gen. Pleasant, well-nourished, in no distress, normal affect   HEENT: [place]/AT, face symmetric, no scleral icterus, PERRLA, nares patent without drainage. Lungs: no accessory muscle use, CTAB, no wheezes or rales Cardiovascular: RRR, no m/r/g, no peripheral edema Abdomen: BS present, soft, NT/ND, no hepatosplenomegaly. Neuro:  A&Ox3, CN II-XII intact, normal gait Skin:  Warm, no lesions/ rash   Wt Readings from Last 3 Encounters:   12/06/16 (!) 352 lb 1.6 oz (159.7 kg)  11/04/16 (!) 352 lb 14.4 oz (160.1 kg)  09/20/16 (!) 354 lb 6.4 oz (160.8 kg)    Lab Results  Component Value Date   WBC 7.4 11/09/2016   HGB 9.7 (L) 11/09/2016   HCT 32.0 (L) 11/09/2016   PLT 390 11/09/2016   GLUCOSE 96 09/20/2016   CHOL 198 09/20/2016   TRIG 134.0 09/20/2016   HDL 79.40 09/20/2016   LDLCALC 92 09/20/2016   ALT 12 09/20/2016   AST 15 09/20/2016   NA 138 09/20/2016   K 3.9 09/20/2016   CL 100 09/20/2016   CREATININE 0.72 09/20/2016   BUN 8 09/20/2016   CO2 29 09/20/2016   TSH 1.19 09/20/2016   HGBA1C 5.6 09/20/2016    Assessment/Plan:  Pituitary adenoma ([place])  - Plan: cabergoline (DOSTINEX) 0.5 MG tablet.  Take 1/2 tab twice a week.  Dose verified by pt's home pharmacy. -Pt encouraged to reschedule appt with Endo.  F/u prn.

## 2016-12-06 NOTE — Patient Instructions (Addendum)
Iron-Rich Diet Iron is a mineral that helps your body to produce hemoglobin. Hemoglobin is a protein in your red blood cells that carries oxygen to your body's tissues. Eating too little iron may cause you to feel weak and tired, and it can increase your risk for infection. Eating enough iron is necessary for your body's metabolism, muscle function, and nervous system. Iron is naturally found in many foods. It can also be added to foods or fortified in foods. There are two types of dietary iron:  Heme iron. Heme iron is absorbed by the body more easily than nonheme iron. Heme iron is found in meat, poultry, and fish.  Nonheme iron. Nonheme iron is found in dietary supplements, iron-fortified grains, beans, and vegetables.  You may need to follow an iron-rich diet if:  You have been diagnosed with iron deficiency or iron-deficiency anemia.  You have a condition that prevents you from absorbing dietary iron, such as: ? Infection in your intestines. ? Celiac disease. This involves long-lasting (chronic) inflammation of your intestines.  You do not eat enough iron.  You eat a diet that is high in foods that impair iron absorption.  You have lost a lot of blood.  You have heavy bleeding during your menstrual cycle.  You are pregnant.  What is my plan? Your health care provider may help you to determine how much iron you need per day based on your condition. Generally, when a person consumes sufficient amounts of iron in the diet, the following iron needs are met:  Men. ? 14-18 years old: 11 mg per day. ? 19-50 years old: 8 mg per day.  Women. ? 14-18 years old: 15 mg per day. ? 19-50 years old: 18 mg per day. ? Over 50 years old: 8 mg per day. ? Pregnant women: 27 mg per day. ? Breastfeeding women: 9 mg per day.  What do I need to know about an iron-rich diet?  Eat fresh fruits and vegetables that are high in vitamin C along with foods that are high in iron. This will help  increase the amount of iron that your body absorbs from food, especially with foods containing nonheme iron. Foods that are high in vitamin C include oranges, peppers, tomatoes, and mango.  Take iron supplements only as directed by your health care provider. Overdose of iron can be life-threatening. If you were prescribed iron supplements, take them with orange juice or a vitamin C supplement.  Cook foods in pots and pans that are made from iron.  Eat nonheme iron-containing foods alongside foods that are high in heme iron. This helps to improve your iron absorption.  Certain foods and drinks contain compounds that impair iron absorption. Avoid eating these foods in the same meal as iron-rich foods or with iron supplements. These include: ? Coffee, black tea, and red wine. ? Milk, dairy products, and foods that are high in calcium. ? Beans, soybeans, and peas. ? Whole grains.  When eating foods that contain both nonheme iron and compounds that impair iron absorption, follow these tips to absorb iron better. ? Soak beans overnight before cooking. ? Soak whole grains overnight and drain them before using. ? Ferment flours before baking, such as using yeast in bread dough. What foods can I eat? Grains Iron-fortified breakfast cereal. Iron-fortified whole-wheat bread. Enriched rice. Sprouted grains. Vegetables Spinach. Potatoes with skin. Green peas. Broccoli. Red and green bell peppers. Fermented vegetables. Fruits Prunes. Raisins. Oranges. Strawberries. Mango. Grapefruit. Meats and Other Protein Sources   Beef liver. Oysters. Beef. Shrimp. [place]. Chicken. [place]. Sardines. Chickpeas. Nuts. Tofu. Beverages Tomato juice. Fresh orange juice. Prune juice. Hibiscus tea. Fortified instant breakfast shakes. Condiments Tahini. Fermented soy sauce. Sweets and Desserts Black-strap molasses. Other Wheat germ. The items listed above may not be a complete list of recommended foods or beverages.  Contact your dietitian for more options. What foods are not recommended? Grains Whole grains. Bran cereal. Bran flour. Oats. Vegetables Artichokes. Brussels sprouts. Kale. Fruits Blueberries. Raspberries. Strawberries. Figs. Meats and Other Protein Sources Soybeans. Products made from soy protein. Dairy Milk. Cream. Cheese. Yogurt. Cottage cheese. Beverages Coffee. Black tea. Red wine. Sweets and Desserts Cocoa. Chocolate. Ice cream. Other Basil. Oregano. Parsley. The items listed above may not be a complete list of foods and beverages to avoid. Contact your dietitian for more information. This information is not intended to replace advice given to you by your health care provider. Make sure you discuss any questions you have with your health care provider. Document Released: 08/04/2004 Document Revised: 07/11/2015 Document Reviewed: 07/18/2013 Elsevier Interactive Patient Education  [employer].

## 2016-12-17 ENCOUNTER — Other Ambulatory Visit (HOSPITAL_BASED_OUTPATIENT_CLINIC_OR_DEPARTMENT_OTHER): Payer: 59

## 2016-12-17 DIAGNOSIS — D500 Iron deficiency anemia secondary to blood loss (chronic): Secondary | ICD-10-CM

## 2016-12-17 DIAGNOSIS — N920 Excessive and frequent menstruation with regular cycle: Secondary | ICD-10-CM

## 2016-12-17 LAB — CBC & DIFF AND RETIC
BASO%: 0.2 % (ref 0.0–2.0)
Basophils Absolute: 0 10*3/uL (ref 0.0–0.1)
EOS%: 2.6 % (ref 0.0–7.0)
Eosinophils Absolute: 0.2 10*3/uL (ref 0.0–0.5)
HCT: 35.1 % (ref 34.8–46.6)
HGB: 10.9 g/dL — ABNORMAL LOW (ref 11.6–15.9)
Immature Retic Fract: 11.4 % — ABNORMAL HIGH (ref 1.60–10.00)
LYMPH%: 25.1 % (ref 14.0–49.7)
MCH: 20.5 pg — ABNORMAL LOW (ref 25.1–34.0)
MCHC: 31.1 g/dL — ABNORMAL LOW (ref 31.5–36.0)
MCV: 66.1 fL — ABNORMAL LOW (ref 79.5–101.0)
MONO#: 0.2 10*3/uL (ref 0.1–0.9)
MONO%: 3 % (ref 0.0–14.0)
NEUT#: 4.6 10*3/uL (ref 1.5–6.5)
NEUT%: 69.1 % (ref 38.4–76.8)
Platelets: 349 10*3/uL (ref 145–400)
RBC: 5.31 10*6/uL (ref 3.70–5.45)
RDW: 22.3 % — ABNORMAL HIGH (ref 11.2–14.5)
Retic %: 1.18 % (ref 0.70–2.10)
Retic Ct Abs: 62.66 10*3/uL (ref 33.70–90.70)
WBC: 6.7 10*3/uL (ref 3.9–10.3)
lymph#: 1.7 10*3/uL (ref 0.9–3.3)

## 2016-12-17 LAB — IRON AND TIBC
%SAT: 16 % — ABNORMAL LOW (ref 21–57)
Iron: 49 ug/dL (ref 41–142)
TIBC: 311 ug/dL (ref 236–444)
UIBC: 263 ug/dL (ref 120–384)

## 2016-12-17 LAB — FERRITIN: Ferritin: 283 ng/ml — ABNORMAL HIGH (ref 9–269)

## 2016-12-19 ENCOUNTER — Other Ambulatory Visit: Payer: Self-pay | Admitting: Hematology

## 2016-12-19 DIAGNOSIS — D509 Iron deficiency anemia, unspecified: Secondary | ICD-10-CM

## 2016-12-20 ENCOUNTER — Ambulatory Visit: Payer: 59 | Admitting: Internal Medicine

## 2017-01-25 ENCOUNTER — Ambulatory Visit: Payer: 59 | Admitting: Internal Medicine

## 2017-01-28 ENCOUNTER — Inpatient Hospital Stay: Payer: 59 | Attending: Hematology

## 2017-01-28 DIAGNOSIS — E669 Obesity, unspecified: Secondary | ICD-10-CM | POA: Insufficient documentation

## 2017-01-28 DIAGNOSIS — D573 Sickle-cell trait: Secondary | ICD-10-CM | POA: Insufficient documentation

## 2017-01-28 DIAGNOSIS — K589 Irritable bowel syndrome without diarrhea: Secondary | ICD-10-CM | POA: Insufficient documentation

## 2017-01-28 DIAGNOSIS — N920 Excessive and frequent menstruation with regular cycle: Secondary | ICD-10-CM | POA: Insufficient documentation

## 2017-01-28 DIAGNOSIS — D509 Iron deficiency anemia, unspecified: Secondary | ICD-10-CM

## 2017-01-28 DIAGNOSIS — E221 Hyperprolactinemia: Secondary | ICD-10-CM | POA: Insufficient documentation

## 2017-01-28 DIAGNOSIS — D352 Benign neoplasm of pituitary gland: Secondary | ICD-10-CM | POA: Diagnosis not present

## 2017-01-28 DIAGNOSIS — D500 Iron deficiency anemia secondary to blood loss (chronic): Secondary | ICD-10-CM | POA: Insufficient documentation

## 2017-01-28 DIAGNOSIS — R5383 Other fatigue: Secondary | ICD-10-CM | POA: Diagnosis not present

## 2017-01-28 DIAGNOSIS — N926 Irregular menstruation, unspecified: Secondary | ICD-10-CM | POA: Diagnosis not present

## 2017-01-28 LAB — CBC WITH DIFFERENTIAL (CANCER CENTER ONLY)
Basophils Absolute: 0 10*3/uL (ref 0.0–0.1)
Basophils Relative: 0 %
Eosinophils Absolute: 0.2 10*3/uL (ref 0.0–0.5)
Eosinophils Relative: 3 %
HCT: 35.3 % (ref 34.8–46.6)
Hemoglobin: 11.2 g/dL — ABNORMAL LOW (ref 11.6–15.9)
Lymphocytes Relative: 20 %
Lymphs Abs: 1.5 10*3/uL (ref 0.9–3.3)
MCH: 22.1 pg — ABNORMAL LOW (ref 25.1–34.0)
MCHC: 31.7 g/dL (ref 31.5–36.0)
MCV: 69.8 fL — ABNORMAL LOW (ref 79.5–101.0)
Monocytes Absolute: 0.3 10*3/uL (ref 0.1–0.9)
Monocytes Relative: 5 %
Neutro Abs: 5.3 10*3/uL (ref 1.5–6.5)
Neutrophils Relative %: 72 %
Platelet Count: 360 10*3/uL (ref 145–400)
RBC: 5.06 MIL/uL (ref 3.70–5.45)
RDW: 20.8 % — ABNORMAL HIGH (ref 11.2–16.1)
WBC Count: 7.3 10*3/uL (ref 3.9–10.3)

## 2017-01-28 LAB — IRON AND TIBC
Iron: 46 ug/dL (ref 41–142)
Saturation Ratios: 16 % — ABNORMAL LOW (ref 21–57)
TIBC: 293 ug/dL (ref 236–444)
UIBC: 246 ug/dL

## 2017-01-28 LAB — RETICULOCYTES
RBC.: 5.06 MIL/uL (ref 3.70–5.45)
Retic Count, Absolute: 65.8 10*3/uL (ref 33.7–90.7)
Retic Ct Pct: 1.3 % (ref 0.7–2.1)

## 2017-01-28 LAB — FERRITIN: Ferritin: 174 ng/mL (ref 9–269)

## 2017-01-31 LAB — HEMOGLOBINOPATHY EVALUATION
Hgb A2 Quant: 4.4 % — ABNORMAL HIGH (ref 1.8–3.2)
Hgb A: 64.6 % — ABNORMAL LOW (ref 96.4–98.8)
Hgb C: 0 %
Hgb F Quant: 0 % (ref 0.0–2.0)
Hgb S Quant: 31 % — ABNORMAL HIGH
Hgb Variant: 0 %

## 2017-02-01 ENCOUNTER — Inpatient Hospital Stay: Payer: 59 | Admitting: Hematology

## 2017-02-01 ENCOUNTER — Encounter: Payer: Self-pay | Admitting: Hematology

## 2017-02-01 ENCOUNTER — Other Ambulatory Visit: Payer: 59

## 2017-02-01 VITALS — BP 140/80 | HR 86 | Temp 98.8°F | Resp 18 | Ht 67.0 in | Wt 354.3 lb

## 2017-02-01 DIAGNOSIS — D352 Benign neoplasm of pituitary gland: Secondary | ICD-10-CM

## 2017-02-01 DIAGNOSIS — D573 Sickle-cell trait: Secondary | ICD-10-CM | POA: Insufficient documentation

## 2017-02-01 DIAGNOSIS — D500 Iron deficiency anemia secondary to blood loss (chronic): Secondary | ICD-10-CM

## 2017-02-01 DIAGNOSIS — E669 Obesity, unspecified: Secondary | ICD-10-CM

## 2017-02-01 DIAGNOSIS — N926 Irregular menstruation, unspecified: Secondary | ICD-10-CM

## 2017-02-01 DIAGNOSIS — N920 Excessive and frequent menstruation with regular cycle: Secondary | ICD-10-CM

## 2017-02-01 DIAGNOSIS — K589 Irritable bowel syndrome without diarrhea: Secondary | ICD-10-CM | POA: Diagnosis not present

## 2017-02-01 DIAGNOSIS — E221 Hyperprolactinemia: Secondary | ICD-10-CM

## 2017-02-01 DIAGNOSIS — R5383 Other fatigue: Secondary | ICD-10-CM | POA: Diagnosis not present

## 2017-02-01 NOTE — Progress Notes (Signed)
[place]  Telephone:([phone]) [phone] Fax:(336) [phone]  Clinic follow Up Note   Patient Care Team: [name], MD as PCP - General (Family Medicine) 02/01/2017  CHIEF COMPLAINTS:  Follow up Iron deficiency anemia   HISTORY OF PRESENTING ILLNESS:  Ana Padilla 32 y.o. 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 She recently moved to [facility] from [place] in April because of her boyfriend. She was followed for several medical conditions including anemia and h/o pituitary adenoma in that area. Overall, in her daily activities she does feel somewhat restricted due to her fatigue, but there are no other prohibitive symptoms. Of note, she is adopted and unsure of her father's medical history. She does note that her mother's side of the family does have leukemia, but otherwise she is unsure of their history as well. She does drink alcohol socially as well.  She recently established care with Dr Volanda Napoleon, Langley Adie, MD for her primary care. She had basic labs drawn which showed a Hgb level of 9.9, HCT of 31.8, MCV of 62.7, and increased platelets at 445.0K. Prolactin was also notable to be elevated at 54.6, endocrinology referral was placed. Additionally, Vitamin D level was low at 20.41 as well. She was started on Ergocalciferol 50000 IU every week for 8 weeks. She will begin OTC Vitamin D 1K-2K IU daily. She was subsequently referred into Hematology following this.   She states has been diagnosed with anemia for approximately 12 years now. She reports that her counts typically drop around the time of her menses, during which time she will become noticeably fatigued. Her menses have been irregular at baseline.  Her menses usually occur every 3-4 months and they are very heavy at baseline. She typically has to change her tampon every 30 minutes and does have the passage of clots as well. The longest period has been 13 days and she reports that it  will be consistently heavy until the last two days of her menses before it tapers off.   Additionally, she was noted to have a 40lbs weight gain at the beginning of the year which was rapid and unexpected. This was thought to be due to Cushing's syndrome, however, she was tested for this and it was negative.   Her lowest Hgb level in the past to her knowledge was 8.0, but she has never had to previously taken any blood transfusions. She has previously had one infusion of IV Iron and she notes that following this she began a menses and this was still very heavy. She is currently taking Vitamin D and iron supplement with OJ in the mornings, but she remained fatigue throughout the day. She also is currently taking Cabergoline for her elevated Prolactin and she has not noticed any changes in her fatigue related to this.   On review of systems, pt denies fever, chills, rash, mouth sores, weight loss, decreased appetite, urinary complaints. Denies pain. Pt denies abdominal pain, nausea, vomiting.   CURRENT THERAPY: oral ferrous sulfate, IV Feraheme as needed  INTERIM HISTORY: Jamaya returns for follow-up.  She received IV Feraheme twice on November 16 and November 23, she tolerated well, but did not feel any difference after the infusion.  She still has significant fatigue, which is her main complaint.  She is able to tolerate routine activities and full-time job, but still sleeps a lot, does not exercise regularly.  She is scheduled to see her endocrinologist and  gastroenterologist next few weeks.  MEDICAL HISTORY:  Past Medical History:  Diagnosis Date  . Adenoma of pituitary ([place]) 08/2014    SURGICAL HISTORY: Past Surgical History:  Procedure Laterality Date  . CHOLECYSTECTOMY      SOCIAL HISTORY: Social History   Socioeconomic History  . Marital status: Single    Spouse name: Not on file  . Number of children: Not on file  . Years of education: Not on file  . Highest education level:  Not on file  Social Needs  . Financial resource strain: Not on file  . Food insecurity - worry: Not on file  . Food insecurity - inability: Not on file  . Transportation needs - medical: Not on file  . Transportation needs - non-medical: Not on file  Occupational History  . Not on file  Tobacco Use  . Smoking status: Never Smoker  . Smokeless tobacco: Never Used  Substance and Sexual Activity  . Alcohol use: Yes    Comment: socially   . Drug use: No  . Sexual activity: Not on file  Other Topics Concern  . Not on file  Social History Narrative  . Not on file    FAMILY HISTORY: Family History  Problem Relation Age of Onset  . Sickle cell trait Mother   . Heart disease Father     ALLERGIES:  is allergic to food.  MEDICATIONS:  Current Outpatient Medications  Medication Sig Dispense Refill  . cabergoline (DOSTINEX) 0.5 MG tablet Take 0.5 tablets (0.25 mg total) by mouth 2 (two) times a week. 10 tablet 2  . cetirizine (ZYRTEC) 10 MG tablet Take 10 mg by mouth daily at 10 pm.    . Cholecalciferol (VITAMIN D PO) Take by mouth.    . Ferrous Sulfate (IRON SUPPLEMENT PO) Take by mouth.    . Probiotic Product (PROBIOTIC DAILY) CAPS Take 1 capsule by mouth daily. 90 capsule 3  . Vitamin D, Ergocalciferol, (DRISDOL) 50000 units CAPS capsule Take 1 capsule (50,000 Units total) by mouth every 7 (seven) days. 8 capsule 0   No current facility-administered medications for this visit.     REVIEW OF SYSTEMS:   Constitutional: Denies fevers, chills or abnormal night sweats (+) fatigue Eyes: Denies blurriness of vision, double vision or watery eyes Ears, nose, mouth, throat, and face: Denies mucositis or sore throat Respiratory: Denies cough, dyspnea or wheezes Cardiovascular: Denies palpitation, chest discomfort or lower extremity swelling Gastrointestinal:  Denies nausea, heartburn or change in bowel habits Skin: Denies abnormal skin rashes Lymphatics: Denies new lymphadenopathy or  easy bruising Neurological:Denies numbness, tingling or new weaknesses Behavioral/Psych: Mood is stable, no new changes  All other systems were reviewed with the patient and are negative.  PHYSICAL EXAMINATION: ECOG PERFORMANCE STATUS: 1 - Symptomatic but completely ambulatory  Vitals:   02/01/17 1403  BP: 140/80  Pulse: 86  Resp: 18  Temp: 98.8 F (37.1 C)  SpO2: 100%   Filed Weights   02/01/17 1403  Weight: (!) 354 lb 4.8 oz (160.7 kg)    GENERAL:alert, no distress and comfortable SKIN: skin color, texture, turgor are normal, no rashes or significant lesions EYES: normal, conjunctiva are pink and non-injected, sclera clear OROPHARYNX:no exudate, no erythema and lips, buccal mucosa, and tongue normal  NECK: supple, thyroid normal size, non-tender, without nodularity LYMPH:  no palpable lymphadenopathy in the cervical, axillary or inguinal LUNGS: clear to auscultation and percussion with normal breathing effort HEART: regular rate & rhythm and no murmurs and no lower  extremity edema ABDOMEN:abdomen soft, non-tender and normal bowel sounds Musculoskeletal:no cyanosis of digits and no clubbing  PSYCH: alert & oriented x 3 with fluent speech NEURO: no focal motor/sensory deficits  LABORATORY DATA:  I have reviewed the data as listed CBC Latest Ref Rng & Units 01/28/2017 12/17/2016 11/09/2016  WBC 3.9 - 10.3 K/uL 7.3 6.7 7.4  Hemoglobin 11.6 - 15.9 g/dL - 10.9(L) 9.7(L)  Hematocrit 34.8 - 46.6 % 35.3 35.1 32.0(L)  Platelets 145 - 400 K/uL 360 349 390    CMP Latest Ref Rng & Units 09/20/2016  Glucose 70 - 99 mg/dL 96  BUN 6 - 23 mg/dL 8  Creatinine 0.40 - 1.20 mg/dL 0.72  Sodium 135 - 145 mEq/L 138  Potassium 3.5 - 5.1 mEq/L 3.9  Chloride 96 - 112 mEq/L 100  CO2 19 - 32 mEq/L 29  Calcium 8.4 - 10.5 mg/dL 9.3  Total Protein 6.0 - 8.3 g/dL 7.3  Total Bilirubin 0.2 - 1.2 mg/dL 0.4  Alkaline Phos 39 - 117 U/L 65  AST 0 - 37 U/L 15  ALT 0 - 35 U/L 12   Results for  JOSEY, DETTMANN (MRN [id number]) as of 11/04/2016 16:31  Ref. Range 09/20/2016 11:10  Folate Latest Ref Range: >5.9 ng/mL 6.4  Vitamin B12 Latest Ref Range: 211 - 911 pg/mL 265   RADIOGRAPHIC STUDIES: I have personally reviewed the radiological images as listed and agreed with the findings in the report. No results found.  ASSESSMENT No problem-specific Assessment & Plan notes found for this encounter.  This is a lvoely 32yo AA female who presents with anemia since teenager  1. Iron deficiency anemia, secondary to menorrhagia, sickle cell trait  -I have reviewed the patient's labs in great detail with her previously.  -Her CBC in September 2018 showed microcytic anemia with MCV 62.7, moderate anemia with hemoglobin 9.9, normal WBC, elevated platelet count at 445K.  She had a history of iron deficient anemia, likely secondary to her menorrhagia.  This is consistent with iron deficient anemia, which was confirmed on her lab work -She responded to IV iron, her hemoglobin improved to 10.9 after infusion.  Her fatigue did not improve. -Giving the very low MCV, anemia did not resolve after adequate iron replacement, I did hemoglobin electrophoresis to rule out thalassemia.  The test confirmed she has a sickle cell trait (Hgb S 31%), she also has slightly elevated Hgb A2 (4.4%) which could be related her sickle cell trait. No evidence of beta thalassemia.  -Her folate and B12 level were normal. -She has been taking oral iron ferrous sulfate once daily, tolerating well, however still has mild to moderate anemia, and significant fatigue. -We will continue lab monitoring every 6 weeks, will set up IV Feraheme if she has low ferritin (<30) or low serum iron level   2.  Obesity, menorrhagia, irregular menstrual period -She will follow-up with her gynecologist    3.  Pituitary adenoma and hyperprolactinemia  -She has appointment to see endocrinologist Dr. [name] next month   4. Obesity  -I encouraged  her to eat healthy and exercise, and try to lose weight.  5. IBS -She has appointment to see her GI in a few weeks  6. Fatigue -This is likely multifactorial.  Her fatigue did not improve after IV iron for her anemia.  Anemia is much improved, I do not think her fatigue is mainly from her anemia. I again strongly encouraged her to exercise, and try to lose some weight.  She  is on low carb diet now  PLAN:  -Lab in 6 weeks, will set up iv feraheme if ferritin <30 or low serum iron level -F/U in 6 months   All questions were answered. The patient knows to call the clinic with any problems, questions or concerns.    Truitt Merle, MD 02/01/17

## 2017-02-04 ENCOUNTER — Ambulatory Visit: Payer: 59 | Admitting: Hematology

## 2017-02-07 DIAGNOSIS — H02052 Trichiasis without entropian right lower eyelid: Secondary | ICD-10-CM | POA: Diagnosis not present

## 2017-02-11 ENCOUNTER — Ambulatory Visit: Payer: 59 | Admitting: Internal Medicine

## 2017-02-11 ENCOUNTER — Encounter: Payer: Self-pay | Admitting: Internal Medicine

## 2017-02-11 VITALS — BP 124/78 | HR 85 | Ht 67.0 in | Wt 350.0 lb

## 2017-02-11 DIAGNOSIS — E221 Hyperprolactinemia: Secondary | ICD-10-CM | POA: Diagnosis not present

## 2017-02-11 DIAGNOSIS — R635 Abnormal weight gain: Secondary | ICD-10-CM

## 2017-02-11 DIAGNOSIS — D352 Benign neoplasm of pituitary gland: Secondary | ICD-10-CM

## 2017-02-11 LAB — FOLLICLE STIMULATING HORMONE: FSH: 5.3 m[IU]/mL

## 2017-02-11 LAB — LUTEINIZING HORMONE: LH: 15.95 m[IU]/mL

## 2017-02-11 NOTE — Patient Instructions (Signed)
Please stop at the lab.  Continue Cabergoline 0.25 mg 2x a week.  Please return in 6 months.  Pituitary Tumors Pituitary tumors are abnormal growths found in the pituitary gland. The pituitary gland is a small organ-about the size of a dime-located in the center of the brain. It makes hormones that affect growth and the functions of other glands in the body. Most pituitary tumors are benign. This means they are noncancerous. They grow slowly and do not spread to other parts of the body. A pituitary tumor may make the pituitary gland produce too many hormones. Tumors that make hormones are called functioning tumors (those that do not make hormones are called nonfunctioning tumors). Problems that can be caused by pituitary tumors include:  Cushing disease. This disease causes fat to build up in the face, back, and chest while the arms and legs become thin.  Acromegaly. This is a condition in which the hands, feet, and face are larger than normal.  Breast milk production even though there is no pregnancy.  What are the causes? The cause of most pituitary tumors is not known. In some cases, these kinds of tumors run in a family. What increases the risk? Some cases of pituitary tumors are due to genetic factors that a person inherits that increase the likelihood of developing certain tumors, including pituitary tumors. What are the signs or symptoms?  Headaches.  Vision problems.  Weakness or low energy.  Clear fluid draining from the nose.  Changes in the sense of smell.  Feeling sick to your stomach (nauseous) and vomiting.  Problems caused by the production of too many hormones, such as: ? Infertility. ? Loss of menstrual periods in women. ? Abnormal growth. ? High blood pressure (hypertension). ? Heat or cold intolerance. ? Other skin and body changes. ? Nipple discharge. ? Decreased sexual function. How is this diagnosed? If you develop symptoms, you will be sent for a CT  scan or MRI to look for pituitary tumors. If you know that these kinds of tumors run in your family, you may need to have your blood tested regularly to monitor pituitary hormone levels. How is this treated? These tumors are best treated when they are found and diagnosed early. Treatments include:  Surgical removal of the tumor. This is the most common treatment.  Radiation therapy. During this treatment, high doses of X-rays are used to kill tumor cells.  Drug therapy. This involves using certain medicines to block the pituitary gland from producing too many hormones.  Follow these instructions at home:  Drink plenty of fluids.  Measure your urine output if directed to do so by your health care provider.  Do not pick your nose or remove any crusting.  Do not do any activities that require straining.  Take all medicines as directed by your health care provider.  Keep follow-up appointments as directed by your health care provider. Contact a health care provider if:  You have sudden, unusual thirst.  You are urinating frequently.  You have a headache that will not go away.  You have new vision changes.  You notice clear fluid leaking from your nose or ears, a sensation of fluid trickling down the back of your throat, or a salty taste in your mouth.  You are having trouble concentrating. Get help right away if:  Your symptoms suddenly become severe.  You have a nosebleed that does not stop after a few minutes.  You have a fever over 101F (38.3C).  You  have a severe headache or a stiff neck.  You are confused or not as alert as usual.  You have chest pain or shortness of breath. This information is not intended to replace advice given to you by your health care provider. Make sure you discuss any questions you have with your health care provider. Document Released: 12/11/2001 Document Revised: 05/29/2015 Document Reviewed: 06/23/2012 Elsevier Interactive Patient  Education  [employer].

## 2017-02-11 NOTE — Progress Notes (Signed)
Patient ID: Ana Padilla, female   DOB: [birth year], 30 y.o.   MRN: [id number]    HPI  Ana Padilla is a 32 y.o.-year-old female, referred by her PCP, Dr. Salomon Fick, for evaluation for pituitary adenoma and hyperprolactinemia.  She missed 2 initial appointments with me and today she is late 10 minutes for her appointment.  Pt. has been dx with a pituitary adenoma in 2016 during investigation for amenorrhea. At that time, PRL was 43. Of note, she r/o for adrenal insufficiency and Cushing sd. then.  She was started on cabergoline in 2017, currently on 0.25 mg twice a week.  Her menses have always been irregular, but became regular after starting Cabergoline, but then became irregular after stopping Cabergoline after moving to [place] >> was off for 7 mo, restarted in 10/2016 - no missed doses: Sunday and Wed/Thu.  Recent PRL levels: Lab Results  Component Value Date   PROLACTIN 54.6 (H) 09/20/2016    She denies: - Galactorrhea - fatigue - constipation - dry skin - hair loss - depression  Pt c/o: - headaches: resolved with new glasses + blepharoplasty and now on Cabergoline - weight gain  I reviewed pt's thyroid tests: Lab Results  Component Value Date   TSH 1.19 09/20/2016   FREET4 0.68 09/20/2016     ROS: Constitutional: + see HPI Eyes: no blurry vision, no xerophthalmia ENT: no sore throat, + nodules palpated in throat, no dysphagia/odynophagia, no hoarseness Cardiovascular: no CP/SOB/palpitations/leg swelling Respiratory: no cough/SOB Gastrointestinal: no N/V/D/C Musculoskeletal: no muscle/joint aches Skin: no rashes Neurological: no tremors/numbness/tingling/dizziness Psychiatric: no depression/anxiety  Past Medical History:  Diagnosis Date  . Adenoma of pituitary ([facility]) 08/2014   Past Surgical History:  Procedure Laterality Date  . CHOLECYSTECTOMY     Social History   Socioeconomic History  . Marital status: Single    Spouse name: Not on file  . Number of  children: 0  Social Needs  Occupational History  .  Scheduler  Tobacco Use  . Smoking status: Never Smoker  . Smokeless tobacco: Never Used  Substance and Sexual Activity  . Alcohol use: Yes    Comment: socially   . Drug use: No   Current Outpatient Medications on File Prior to Visit  Medication Sig Dispense Refill  . cabergoline (DOSTINEX) 0.5 MG tablet Take 0.5 tablets (0.25 mg total) by mouth 2 (two) times a week. 10 tablet 2  . cetirizine (ZYRTEC) 10 MG tablet Take 10 mg by mouth daily at 10 pm.    . Cholecalciferol (VITAMIN D PO) Take by mouth.    . Ferrous Sulfate (IRON SUPPLEMENT PO) Take by mouth.    . Probiotic Product (PROBIOTIC DAILY) CAPS Take 1 capsule by mouth daily. 90 capsule 3  . Vitamin D, Ergocalciferol, (DRISDOL) 50000 units CAPS capsule Take 1 capsule (50,000 Units total) by mouth every 7 (seven) days. (Patient not taking: Reported on 02/11/2017) 8 capsule 0   No current facility-administered medications on file prior to visit.    Allergies  Allergen Reactions  . Food     Seasonal Allergies & fresh fruit---itchy mouth&nose    Family History  Problem Relation Age of Onset  . Sickle cell trait Mother   . Heart disease Father   Hypertension in father  PE: BP 124/78   Pulse 85   Ht 5\' 7"  (1.702 m)   Wt (!) 350 lb (158.8 kg)   SpO2 96%   BMI 54.82 kg/m   Wt Readings from Last 3 Encounters:  02/11/17 Marland Kitchen)  350 lb (158.8 kg)  02/01/17 (!) 354 lb 4.8 oz (160.7 kg)  12/06/16 (!) 352 lb 1.6 oz (159.7 kg)   Constitutional: Obese, in NAD Eyes: PERRLA, EOMI, no exophthalmos ENT: moist mucous membranes, no thyromegaly, no cervical lymphadenopathy Cardiovascular: RRR, No MRG Respiratory: CTA B Gastrointestinal: abdomen soft, NT, ND, BS+ Musculoskeletal: no deformities, strength intact in all 4 Skin: moist, warm, no rashes, + acanthosis nigricans on neck Neurological: no tremor with outstretched hands, DTR normal in all 4  ASSESSMENT: 1. Pituitary  adenoma  2.  Hyperprolactinemia  3. Weigh gain  PLAN:  1. And 2. Patient with 5 year h/o pituitary adenoma, incidentally found during investigation for amenorrhea. 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 - at this point, as her PRL was also high, the suspicion is for prolactinoma, but the Prolactin level is too low for a macroadenoma... We discussed about the possibility of stalk compression from a pituitary tumor which is not the prolactinoma, but also the possibility of hook effect of a falsely low PRL results if there is a high concentration of PRL in blood. We may need to check a PRL level with serial dilutions in that case. I will decide about this after I see the results of her MRI. - she continues on Cabergoline, 0.25 mg 2x a week and we will check if the PRL level has decreased  - She has no side effects from cabergoline. - We discussed about the advantages of cabergoline over bromocriptine, which is the other medication that we could have used: Less frequent dosing, better side effect profile, and also the ability to shrink the pituitary tumor - we will also check the following labs: Orders Placed This Encounter  Procedures  . Prolactin  . Luteinizing hormone  . Follicle stimulating hormone  . Cortisol-am, blood  . Insulin-like growth factor  . Beta HCG, Quant  . ACTH  . hCG, serum, qualitative  - if I cannot obtain records from [place], we will check a new MRI - will schedule a follow-up appointment in 6 months, but will stay in touch until then  3. Weigh gain  - I suggested to join the [facility] weight loss program - Dr Quillian Quince  Component     Latest Ref Rng & Units 02/11/2017  IGF-I, LC/MS     63 - 373 ng/mL 93  Z-Score (Female)     -2.0 - 2 SD -1.1  Prolactin     ng/mL 4.5  LH     mIU/mL 15.95  FSH     mIU/ML 5.3  Cortisol - AM     mcg/dL 81.1  B147  ACTH     6 - 50 pg/mL 16  Preg, Serum      NEGATIVE   All tests normal.  Excellent response to cabergoline.  In fact, we can reduce the dose to 0.25 mg once a week.  We will recheck the prolactin level in 2 months.  Carlus Pavlov, MD PhD [facility] Endocr[facility]

## 2017-02-14 LAB — INSULIN-LIKE GROWTH FACTOR
IGF-I, LC/MS: 93 ng/mL (ref 63–373)
Z-Score (Female): -1.1 SD (ref ?–2.0)

## 2017-02-14 LAB — CORTISOL-AM, BLOOD: Cortisol - AM: 12.1 ug/dL

## 2017-02-14 LAB — ACTH: C206 ACTH: 16 pg/mL (ref 6–50)

## 2017-02-14 LAB — HCG, SERUM, QUALITATIVE: Preg, Serum: NEGATIVE

## 2017-02-14 LAB — PROLACTIN: Prolactin: 4.5 ng/mL

## 2017-02-15 MED ORDER — CABERGOLINE 0.5 MG PO TABS: 0.2500 mg | ORAL_TABLET | ORAL | 2 refills | Status: DC

## 2017-03-02 ENCOUNTER — Other Ambulatory Visit (INDEPENDENT_AMBULATORY_CARE_PROVIDER_SITE_OTHER): Payer: 59

## 2017-03-02 ENCOUNTER — Telehealth: Payer: Self-pay

## 2017-03-02 ENCOUNTER — Encounter: Payer: Self-pay | Admitting: Internal Medicine

## 2017-03-02 ENCOUNTER — Ambulatory Visit: Payer: 59 | Admitting: Internal Medicine

## 2017-03-02 VITALS — BP 110/70 | HR 84 | Ht 66.5 in | Wt 350.4 lb

## 2017-03-02 DIAGNOSIS — R109 Unspecified abdominal pain: Secondary | ICD-10-CM

## 2017-03-02 DIAGNOSIS — R197 Diarrhea, unspecified: Secondary | ICD-10-CM

## 2017-03-02 LAB — IGA: IgA: 186 mg/dL (ref 68–378)

## 2017-03-02 MED ORDER — COLESTIPOL HCL 1 G PO TABS: 2.0000 g | ORAL_TABLET | Freq: Two times a day (BID) | ORAL | 2 refills | Status: DC

## 2017-03-02 NOTE — Telephone Encounter (Signed)
Pt came in for appt a little early.

## 2017-03-02 NOTE — Progress Notes (Addendum)
HISTORY OF PRESENT ILLNESS:  Ana Padilla is a 32 y.o. 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 She is referred today by her primary care provider Dr. Volanda Napoleon regarding chronic abdominal complaints. Patient has relocated to [facility] from [place]. She reports to me greater than 7 year history of problems with increased intestinal gas, postprandial urgency with diarrhea and abdominal cramping. She typically has 6 or 7 bowel movements per day. The majority are loose. She has no constipation. Does not go a day without bowel movements. No blood. No mucus. No incontinence. No significant nocturnal component. Told by GI doctor in [place] that she has IBS. On no therapy. Worsens cholecystectomy in 2016. No particular diet. On no sugar free products. Bowel movements syncope. No steatorrhea. No weight loss. She has been seeing hematology for her iron deficiency anemia. I have reviewed those records. Improvement in blood counts with iron therapy. Most recent hemoglobin 11.2 with MCV 69.8. Comparison favorable bleed 2 hemoglobin 9.9 and MCV 62. Also history of vitamin D deficiency.  REVIEW OF SYSTEMS:  All non-GI ROS negative unless otherwise stated in the history of present illness except for allergies  Past Medical History:  Diagnosis Date  . 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 Anemia   . IBS (irritable bowel syndrome)     Past Surgical History:  Procedure Laterality Date  . BLEPHAROPLASTY Right    lower lid x 3  . CHOLECYSTECTOMY  2016    Social History Ana Padilla  reports that  has never smoked. she has never used smokeless tobacco. She reports that she drinks alcohol. She reports that she does not use drugs.  family history includes Heart disease in her father; Sickle cell trait in her mother. She was adopted.  Allergies  Allergen  Reactions  . Food     Seasonal Allergies & fresh fruit---itchy mouth&nose        PHYSICAL EXAMINATION: Vital signs: BP 110/70 (BP Location: Left Arm, Patient Position: Sitting, Cuff Size: Large)   Pulse 84   Ht 5' 6.5" (1.689 m) Comment: height measured without shoes  Wt (!) 350 lb 6 oz (158.9 kg)   LMP 02/16/2017   BMI 55.70 kg/m   Constitutional: Pleasant, generally well-appearing, no acute distress Psychiatric: alert and oriented x3, cooperative Eyes: extraocular movements intact, anicteric, conjunctiva pink Mouth: oral pharynx moist, no lesions Neck: supple no lymphadenopathy Cardiovascular: heart regular rate and rhythm, no murmur Lungs: clear to auscultation bilaterally Abdomen: soft, obese, nontender, nondistended, no obvious ascites, no peritoneal signs, normal bowel sounds, no organomegaly Rectal: Omitted Extremities: no clubbing, cyanosis, or lower extremity edema bilaterally Skin: no lesions on visible extremities Neuro: No focal deficits. Cranial nerves intact  ASSESSMENT:  #1. Chronic postprandial abdominal cramping with urgency and loose stools. Most consistent with IBS. Evaluate for other entities such as celiac disease, bile salt related diarrhea, bacterial overgrowth #2. Iron deficiency anemia secondary to heavy irregular periods. Being managed by Dr. Burr Medico #3. Morbid obesity. BMI 55.7  PLAN:  #1. Prescribed Colestid 2 g twice a day for possible bile salt related diarrhea exacerbated by cholecystectomy #2. Check celiac serologies #3. Probiotic align one daily for 2 weeks. Samples given #4. Patient will obtain outside GI records for my review #5. This patient must lose weight. She should evaluate the bariatric weight loss program #6. Routine GI follow-up 6 weeks  A copy of this  consultation note has been sent to Dr. Volanda Napoleon  ADDENDUM 09/08/2017: Outside records from [place] received and reviewed. Upper endoscopy and endoscopic ultrasound  performed 07/25/2013. 1. Normal EGD 2. Normal EUS. Cholelithiasis noted without choledocholithiasis. Normal pancreas 3. Biopsies of the duodenum revealed mild chronic duodenitis with preserved villous architecture. Gastric biopsies revealed chronic active gastritis with occasional H. Pylori organisms

## 2017-03-02 NOTE — Telephone Encounter (Signed)
Left message for pt to see if she can come in for visit today at 1pm, waiting on return phone call.

## 2017-03-02 NOTE — Patient Instructions (Addendum)
If you are age 30 or older, your body mass index should be between 23-30. Your Body mass index is 55.7 kg/m. If this is out of the aforementioned range listed, please consider follow up with your Primary Care Provider.  If you are age 54 or younger, your body mass index should be between 19-25. Your Body mass index is 55.7 kg/m. If this is out of the aformentioned range listed, please consider follow up with your Primary Care Provider.   We have sent the following medications to your pharmacy for you to pick up at your convenience: Colestid 1g: Take 2g twice a day  Please go to the lab in the basement of our building to have lab work done as you leave today.  We have given you samples of ALIGN probiotic.  Please take once a day.  We will request your records from Dr. Gilford Rile in [place].  We have scheduled you for a follow up appointment with Dr. Henrene Pastor.  Thank you for choosing [facility].

## 2017-03-03 LAB — TISSUE TRANSGLUTAMINASE ABS,IGG,IGA
(tTG) Ab, IgA: 1 U/mL
(tTG) Ab, IgG: 2 U/mL

## 2017-03-15 ENCOUNTER — Other Ambulatory Visit: Payer: 59

## 2017-03-16 ENCOUNTER — Telehealth: Payer: Self-pay | Admitting: Internal Medicine

## 2017-03-16 NOTE — Telephone Encounter (Signed)
Patient notified via my chart message.

## 2017-03-16 NOTE — Telephone Encounter (Signed)
Pt states the colestipol she was prescribed makes her feel "weird." Reports she noticed she was having heart palpitations with it. Pt wants to know if there is something else Dr. Henrene Pastor could recommend. Please advise.

## 2017-03-16 NOTE — Telephone Encounter (Signed)
Patient states she tried medication colestipol and it is making her have heart palpitations. Patient wanting to know if there is another medication she can try. Pt follow up scheduled for 5.1.19

## 2017-03-16 NOTE — Telephone Encounter (Signed)
This medication does not cause heart palpitations. She is welcome to stop it, but if it is helping I would recommend continuing and see her PCP regarding palpitations

## 2017-03-17 ENCOUNTER — Inpatient Hospital Stay: Payer: 59 | Attending: Hematology

## 2017-03-17 DIAGNOSIS — N921 Excessive and frequent menstruation with irregular cycle: Secondary | ICD-10-CM | POA: Insufficient documentation

## 2017-03-17 DIAGNOSIS — D500 Iron deficiency anemia secondary to blood loss (chronic): Secondary | ICD-10-CM | POA: Diagnosis not present

## 2017-03-17 LAB — RETICULOCYTES
RBC.: 4.51 MIL/uL (ref 3.70–5.45)
Retic Count, Absolute: 130.8 10*3/uL — ABNORMAL HIGH (ref 33.7–90.7)
Retic Ct Pct: 2.9 % — ABNORMAL HIGH (ref 0.7–2.1)

## 2017-03-17 LAB — CBC WITH DIFFERENTIAL (CANCER CENTER ONLY)
Basophils Absolute: 0 10*3/uL (ref 0.0–0.1)
Basophils Relative: 0 %
Eosinophils Absolute: 0.2 10*3/uL (ref 0.0–0.5)
Eosinophils Relative: 2 %
HCT: 32.4 % — ABNORMAL LOW (ref 34.8–46.6)
Hemoglobin: 10.1 g/dL — ABNORMAL LOW (ref 11.6–15.9)
Lymphocytes Relative: 20 %
Lymphs Abs: 1.5 10*3/uL (ref 0.9–3.3)
MCH: 22.4 pg — ABNORMAL LOW (ref 25.1–34.0)
MCHC: 31.2 g/dL — ABNORMAL LOW (ref 31.5–36.0)
MCV: 71.8 fL — ABNORMAL LOW (ref 79.5–101.0)
Monocytes Absolute: 0.4 10*3/uL (ref 0.1–0.9)
Monocytes Relative: 5 %
Neutro Abs: 5.5 10*3/uL (ref 1.5–6.5)
Neutrophils Relative %: 73 %
Platelet Count: 353 10*3/uL (ref 145–400)
RBC: 4.51 MIL/uL (ref 3.70–5.45)
RDW: 17.3 % — ABNORMAL HIGH (ref 11.2–14.5)
WBC Count: 7.6 10*3/uL (ref 3.9–10.3)

## 2017-03-17 LAB — FERRITIN: Ferritin: 95 ng/mL (ref 9–269)

## 2017-03-17 LAB — IRON AND TIBC
Iron: 48 ug/dL (ref 41–142)
Saturation Ratios: 16 % — ABNORMAL LOW (ref 21–57)
TIBC: 309 ug/dL (ref 236–444)
UIBC: 261 ug/dL

## 2017-03-21 DIAGNOSIS — H02003 Unspecified entropion of right eye, unspecified eyelid: Secondary | ICD-10-CM | POA: Diagnosis not present

## 2017-03-22 ENCOUNTER — Telehealth: Payer: Self-pay | Admitting: *Deleted

## 2017-03-22 NOTE — Telephone Encounter (Signed)
TCT patient. Spoke with her and reviewed recent lab work. Pt does want to have another Iron infusion.  Scheduling message sent.

## 2017-03-28 ENCOUNTER — Ambulatory Visit: Payer: 59

## 2017-04-01 ENCOUNTER — Inpatient Hospital Stay: Payer: 59

## 2017-04-01 VITALS — BP 125/72 | HR 68 | Temp 98.5°F | Resp 16

## 2017-04-01 DIAGNOSIS — N921 Excessive and frequent menstruation with irregular cycle: Secondary | ICD-10-CM | POA: Diagnosis not present

## 2017-04-01 DIAGNOSIS — D500 Iron deficiency anemia secondary to blood loss (chronic): Secondary | ICD-10-CM | POA: Diagnosis not present

## 2017-04-01 MED ORDER — SODIUM CHLORIDE 0.9 % IV SOLN
510.0000 mg | Freq: Once | INTRAVENOUS | Status: AC
  Administered 2017-04-01: 510 mg via INTRAVENOUS
  Filled 2017-04-01: qty 17

## 2017-04-01 NOTE — Patient Instructions (Signed)

## 2017-04-12 ENCOUNTER — Other Ambulatory Visit: Payer: Self-pay | Admitting: Hematology

## 2017-04-13 ENCOUNTER — Inpatient Hospital Stay: Payer: 59 | Attending: Hematology

## 2017-04-13 ENCOUNTER — Other Ambulatory Visit: Payer: Self-pay | Admitting: Hematology

## 2017-04-13 DIAGNOSIS — N921 Excessive and frequent menstruation with irregular cycle: Secondary | ICD-10-CM | POA: Diagnosis not present

## 2017-04-13 DIAGNOSIS — D500 Iron deficiency anemia secondary to blood loss (chronic): Secondary | ICD-10-CM | POA: Insufficient documentation

## 2017-04-13 LAB — CBC WITH DIFFERENTIAL (CANCER CENTER ONLY)
Basophils Absolute: 0 10*3/uL (ref 0.0–0.1)
Basophils Relative: 0 %
Eosinophils Absolute: 0.2 10*3/uL (ref 0.0–0.5)
Eosinophils Relative: 3 %
HCT: 31.6 % — ABNORMAL LOW (ref 34.8–46.6)
Hemoglobin: 9.7 g/dL — ABNORMAL LOW (ref 11.6–15.9)
Lymphocytes Relative: 23 %
Lymphs Abs: 1.8 10*3/uL (ref 0.9–3.3)
MCH: 22.6 pg — ABNORMAL LOW (ref 25.1–34.0)
MCHC: 30.7 g/dL — ABNORMAL LOW (ref 31.5–36.0)
MCV: 73.5 fL — ABNORMAL LOW (ref 79.5–101.0)
Monocytes Absolute: 0.4 10*3/uL (ref 0.1–0.9)
Monocytes Relative: 5 %
Neutro Abs: 5.5 10*3/uL (ref 1.5–6.5)
Neutrophils Relative %: 69 %
Platelet Count: 363 10*3/uL (ref 145–400)
RBC: 4.3 MIL/uL (ref 3.70–5.45)
RDW: 16.3 % — ABNORMAL HIGH (ref 11.2–14.5)
WBC Count: 7.9 10*3/uL (ref 3.9–10.3)

## 2017-04-13 LAB — RETICULOCYTES
RBC.: 4.3 MIL/uL (ref 3.70–5.45)
Retic Count, Absolute: 107.5 10*3/uL — ABNORMAL HIGH (ref 33.7–90.7)
Retic Ct Pct: 2.5 % — ABNORMAL HIGH (ref 0.7–2.1)

## 2017-04-13 LAB — IRON AND TIBC
Iron: 43 ug/dL (ref 41–142)
Saturation Ratios: 16 % — ABNORMAL LOW (ref 21–57)
TIBC: 270 ug/dL (ref 236–444)
UIBC: 227 ug/dL

## 2017-04-13 LAB — FERRITIN: Ferritin: 248 ng/mL (ref 9–269)

## 2017-04-14 ENCOUNTER — Inpatient Hospital Stay (HOSPITAL_BASED_OUTPATIENT_CLINIC_OR_DEPARTMENT_OTHER): Payer: 59 | Admitting: Hematology

## 2017-04-14 ENCOUNTER — Encounter: Payer: Self-pay | Admitting: Hematology

## 2017-04-14 ENCOUNTER — Encounter: Payer: 59 | Admitting: Obstetrics and Gynecology

## 2017-04-14 VITALS — BP 125/75 | HR 87 | Temp 97.8°F | Resp 19 | Ht 66.5 in | Wt 351.0 lb

## 2017-04-14 DIAGNOSIS — D500 Iron deficiency anemia secondary to blood loss (chronic): Secondary | ICD-10-CM | POA: Diagnosis not present

## 2017-04-14 DIAGNOSIS — N921 Excessive and frequent menstruation with irregular cycle: Secondary | ICD-10-CM

## 2017-04-14 DIAGNOSIS — E559 Vitamin D deficiency, unspecified: Secondary | ICD-10-CM

## 2017-04-14 NOTE — Progress Notes (Signed)
[facility]  [phone] [phone]  Clinic follow Up Note   Patient Care Team: [name], MD as PCP - General (Family Medicine)   Date of Service:  04/14/2017  CHIEF COMPLAINTS:  Follow up Iron deficiency anemia   HISTORY OF PRESENTING ILLNESS:  Ana Padilla 32 y.o. 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 She recently moved to [facility] from [place] in April because of her boyfriend. She was followed for several medical conditions including anemia and h/o pituitary adenoma in that area. Overall, in her daily activities she does feel somewhat restricted due to her fatigue, but there are no other prohibitive symptoms. Of note, she is adopted and unsure of her father's medical history. She does note that her mother's side of the family does have leukemia, but otherwise she is unsure of their history as well. She does drink alcohol socially as well.  She recently established care with Dr Volanda Napoleon, Langley Adie, MD for her primary care. She had basic labs drawn which showed a Hgb level of 9.9, HCT of 31.8, MCV of 62.7, and increased platelets at 445.0K. Prolactin was also notable to be elevated at 54.6, endocrinology referral was placed. Additionally, Vitamin D level was low at 20.41 as well. She was started on Ergocalciferol 50000 IU every week for 8 weeks. She will begin OTC Vitamin D 1K-2K IU daily. She was subsequently referred into Hematology following this.   She states has been diagnosed with anemia for approximately 12 years now. She reports that her counts typically drop around the time of her menses, during which time she will become noticeably fatigued. Her menses have been irregular at baseline.  Her menses usually occur every 3-4 months and they are very heavy at baseline. She typically has to change her tampon every 30 minutes and does have the passage of clots as well. The longest period has been 13 days and  she reports that it will be consistently heavy until the last two days of her menses before it tapers off.   Additionally, she was noted to have a 40lbs weight gain at the beginning of the year which was rapid and unexpected. This was thought to be due to Cushing's syndrome, however, she was tested for this and it was negative.   Her lowest Hgb level in the past to her knowledge was 8.0, but she has never had to previously taken any blood transfusions. She has previously had one infusion of IV Iron and she notes that following this she began a menses and this was still very heavy. She is currently taking Vitamin D and iron supplement with OJ in the mornings, but she remained fatigue throughout the day. She also is currently taking Cabergoline for her elevated Prolactin and she has not noticed any changes in her fatigue related to this.   On review of systems, pt denies fever, chills, rash, mouth sores, weight loss, decreased appetite, urinary complaints. Denies pain. Pt denies abdominal pain, nausea, vomiting.   CURRENT THERAPY: oral ferrous sulfate, IV Feraheme as needed (11/09/16, 11/26/16, 04/01/17)  INTERIM HISTORY:  Ana Padilla returns for follow-up for her ion deficient anemia. Since her last visit she received IV Feraheme once on 04/01/17.  She presents to the clinic today noting her heavy period bleeding is still on going. She has been changing her pad every 1.5 hours. She will be seen by [facility] next month to work on stopping  her menorrhagia. She notes she felt better after her IV iron. She notes she is worried with surgery including her option of bariatric surgery. She notes she is in a weight loss program currently. She takes Vitamin D 2000-3000 units and oral iron daily to help with her    On review of symptoms, pt denies any other issues with bleeding or bruising. She still having ongoing menorrhagia. This contributes to her fatigue, chest pain and headaches.      MEDICAL  HISTORY:  Past Medical History:  Diagnosis Date  . 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 Anemia   . IBS (irritable bowel syndrome)     SURGICAL HISTORY: Past Surgical History:  Procedure Laterality Date  . BLEPHAROPLASTY Right    lower lid x 3  . CHOLECYSTECTOMY  2016    SOCIAL HISTORY: Social History   Socioeconomic History  . Marital status: Single    Spouse name: Not on file  . Number of children: 0  . Years of education: Not on file  . Highest education level: Not on file  Occupational History  . Occupation: Engineer, maintenance (IT)  Social Needs  . Financial resource strain: Not on file  . Food insecurity:    Worry: Not on file    Inability: Not on file  . Transportation needs:    Medical: Not on file    Non-medical: Not on file  Tobacco Use  . Smoking status: Never Smoker  . Smokeless tobacco: Never Used  Substance and Sexual Activity  . Alcohol use: Yes    Comment: socially -1 per week  . Drug use: No  . Sexual activity: Not on file  Lifestyle  . Physical activity:    Days per week: Not on file    Minutes per session: Not on file  . Stress: Not on file  Relationships  . Social connections:    Talks on phone: Not on file    Gets together: Not on file    Attends religious service: Not on file    Active member of club or organization: Not on file    Attends meetings of clubs or organizations: Not on file    Relationship status: Not on file  . Intimate partner violence:    Fear of current or ex partner: Not on file    Emotionally abused: Not on file    Physically abused: Not on file    Forced sexual activity: Not on file  Other Topics Concern  . Not on file  Social History Narrative  . Not on file    FAMILY HISTORY: Family History  Adopted: Yes  Problem Relation Age of Onset  . Sickle cell trait Mother   . Heart disease Father     ALLERGIES:  is allergic to food.  MEDICATIONS:  Current Outpatient Medications  Medication Sig Dispense  Refill  . cabergoline (DOSTINEX) 0.5 MG tablet Take 0.5 tablets (0.25 mg total) by mouth once a week. 10 tablet 2  . cetirizine (ZYRTEC) 10 MG tablet Take 10 mg by mouth daily at 10 pm.    . Cholecalciferol (VITAMIN D PO) Take by mouth.    . colestipol (COLESTID) 1 g tablet Take 2 tablets (2 g total) by mouth 2 (two) times daily. 120 tablet 2  . Ferrous Sulfate (IRON SUPPLEMENT PO) Take by mouth.     No current facility-administered medications for this visit.     REVIEW OF SYSTEMS:   Constitutional: Denies fevers, chills or abnormal night sweats (+)  significant fatigue (+) occasional headaches Eyes: Denies blurriness of vision, double vision or watery eyes Ears, nose, mouth, throat, and face: Denies mucositis or sore throat Respiratory: Denies cough, dyspnea or wheezes  Cardiovascular: Denies palpitation or lower extremity swelling (+) occasional chest discomfort  Gastrointestinal:  Denies nausea, heartburn or change in bowel habits Skin: Denies abnormal skin rashes Lymphatics: Denies new lymphadenopathy or easy bruising Neurological:Denies numbness, tingling or new weaknesses Behavioral/Psych: Mood is stable, no new changes  All other systems were reviewed with the patient and are negative.  PHYSICAL EXAMINATION: ECOG PERFORMANCE STATUS: 1 - Symptomatic but completely ambulatory  Vitals:   04/14/17 1541  BP: 125/75  Pulse: 87  Resp: 19  Temp: 97.8 F (36.6 C)  SpO2: 100%   Filed Weights   04/14/17 1541  Weight: (!) 351 lb (159.2 kg)    GENERAL:alert, no distress and comfortable SKIN: skin color, texture, turgor are normal, no rashes or significant lesions EYES: normal, conjunctiva are pink and non-injected, sclera clear OROPHARYNX:no exudate, no erythema and lips, buccal mucosa, and tongue normal  NECK: supple, thyroid normal size, non-tender, without nodularity LYMPH:  no palpable lymphadenopathy in the cervical, axillary or inguinal LUNGS: clear to auscultation and  percussion with normal breathing effort HEART: regular rate & rhythm and no murmurs and no lower extremity edema ABDOMEN:abdomen soft, non-tender and normal bowel sounds Musculoskeletal:no cyanosis of digits and no clubbing  PSYCH: alert & oriented x 3 with fluent speech NEURO: no focal motor/sensory deficits  LABORATORY DATA:  I have reviewed the data as listed CBC Latest Ref Rng & Units 04/13/2017 03/17/2017 01/28/2017  WBC 3.9 - 10.3 K/uL 7.9 7.6 7.3  Hemoglobin 11.6 - 15.9 g/dL - - -  Hematocrit 34.8 - 46.6 % 31.6(L) 32.4(L) 35.3  Platelets 145 - 400 K/uL 363 353 360    CMP Latest Ref Rng & Units 09/20/2016  Glucose 70 - 99 mg/dL 96  BUN 6 - 23 mg/dL 8  Creatinine 0.40 - 1.20 mg/dL 0.72  Sodium 135 - 145 mEq/L 138  Potassium 3.5 - 5.1 mEq/L 3.9  Chloride 96 - 112 mEq/L 100  CO2 19 - 32 mEq/L 29  Calcium 8.4 - 10.5 mg/dL 9.3  Total Protein 6.0 - 8.3 g/dL 7.3  Total Bilirubin 0.2 - 1.2 mg/dL 0.4  Alkaline Phos 39 - 117 U/L 65  AST 0 - 37 U/L 15  ALT 0 - 35 U/L 12   Results for WILEEN, DUNCANSON (MRN [id number]) as of 04/14/2017 20:18  Ref. Range 01/28/2017 08:12 03/17/2017 08:43 04/13/2017 08:14  Iron Latest Ref Range: 41 - 142 ug/dL 46 48 43  UIBC Latest Units: ug/dL 246 261 227  TIBC Latest Ref Range: 236 - 444 ug/dL 293 309 270  Saturation Ratios Latest Ref Range: 21 - 57 % 16 (L) 16 (L) 16 (L)  Ferritin Latest Ref Range: 9 - 269 ng/mL 174 95 248   RADIOGRAPHIC STUDIES: I have personally reviewed the radiological images as listed and agreed with the findings in the report. No results found.  ASSESSMENT No problem-specific Assessment & Plan notes found for this encounter.  This is a lovely 32 y.o. AA female who presents with anemia since teenager  1. Iron deficiency anemia, secondary to menorrhagia, sickle cell trait  -I have reviewed the patient's labs in great detail with her previously.  -Her CBC in September 2018 showed microcytic anemia with MCV 62.7, moderate anemia  with hemoglobin 9.9, normal WBC, elevated platelet count at 445K. She had a  history of iron deficient anemia, likely secondary to her menorrhagia. This is consistent with iron deficient anemia, which was confirmed on her lab work -She responded to IV iron, her hemoglobin improved to 10.9 after infusion.  Her fatigue did not improve. -Giving the very low MCV, anemia did not resolve after adequate iron replacement, I did hemoglobin electrophoresis to rule out thalassemia.  The test confirmed she has a sickle cell trait (Hgb S 31%), she also has slightly elevated Hgb A2 (4.4%) which could be related her sickle cell trait. No evidence of beta thalassemia.  -She has been taking oral iron ferrous sulfate once daily, tolerating well, however still has mild to moderate anemia, and significant fatigue. -Pt notes she tried various forms of birth control  In the past which did not help to control her menorrhagia.  She is scheduled to see her gynecologist next months to discuss management of her menorrhagia -Her endocrine work up with Dr. [name] and GI workup with Dr. Henrene Pastor was normal other than IBS being found and is now being treated for. -She has had prolonged menstrual period since 2 weeks ago. Labs reviewed, hg at 9.7, worse than before, likely secondary to her blood loss from menorrhagia.  Her ferritin was 248 yesterday, saturation at 16. She does not need blood transfusion.  Due to her ongoing blood loss, I anticipate her anemia will get worse, I have arranged IV Feraheme tomorrow.  -Will check her Vitamin D with next lab -I encouraged  her to stay active to combat her fatigue.  -We will continue lab monitoring every 4 weeks, will set up IV Feraheme if she has low ferritin (<150) or low serum iron level  -F/u in 4 months   2.  Obesity, menorrhagia, irregular menstrual period -She will follow-up with her gynecologist  -I encouraged her to eat healthy and exercise, and try to lose weight.  -She is planning  to participate [facility] weight loss program.  -I discussed the option of bariatric surgery and what that would include and how this would effect her anemia and fatigue. She is scared of surgery, does not plan to do.   3.  Pituitary adenoma and hyperprolactinemia  -She has seen endocrinologist Dr. [name], on dostinex, will continue f/u   4. IBS -She is being followed by GI Dr. Henrene Pastor who put her on Colestid  5. Fatigue  -This is likely multifactorial. Her fatigue did not improve after IV iron for her anemia. Anemia is much improved, I do not think her fatigue is mainly from her anemia. -I again strongly encouraged her to exercise, and try to lose some weight.    PLAN:  -She is scheduled for IV Feraheme tomorrow  -lab every 4 weeks X4, consider IV Feraheme if ferritin less than 150 or low serum iron level -F/u in 4 months  All questions were answered. The patient knows to call the clinic with any problems, questions or concerns.    Truitt Merle, MD 04/14/17   This document serves as a record of services personally performed by Truitt Merle, MD. It was created on her behalf by Joslyn Devon, a trained medical scribe. The creation of this record is based on the scribe's personal observations and the provider's statements to them.   I have reviewed the above documentation for accuracy and completeness, and I agree with the above.

## 2017-04-15 ENCOUNTER — Inpatient Hospital Stay: Payer: 59

## 2017-04-15 VITALS — BP 109/86 | HR 73 | Temp 98.8°F | Resp 18

## 2017-04-15 DIAGNOSIS — D500 Iron deficiency anemia secondary to blood loss (chronic): Secondary | ICD-10-CM | POA: Diagnosis not present

## 2017-04-15 DIAGNOSIS — N921 Excessive and frequent menstruation with irregular cycle: Secondary | ICD-10-CM | POA: Diagnosis not present

## 2017-04-15 MED ORDER — SODIUM CHLORIDE 0.9 % IV SOLN
510.0000 mg | Freq: Once | INTRAVENOUS | Status: AC
  Administered 2017-04-15: 510 mg via INTRAVENOUS
  Filled 2017-04-15: qty 17

## 2017-04-15 NOTE — Patient Instructions (Signed)

## 2017-04-18 ENCOUNTER — Telehealth: Payer: Self-pay | Admitting: Hematology

## 2017-04-18 NOTE — Telephone Encounter (Signed)
Appointment scheduled pt notified per 4/11 los

## 2017-04-19 ENCOUNTER — Ambulatory Visit: Payer: 59 | Admitting: Internal Medicine

## 2017-04-26 ENCOUNTER — Other Ambulatory Visit: Payer: 59

## 2017-04-28 ENCOUNTER — Encounter: Payer: 59 | Admitting: Family Medicine

## 2017-04-28 ENCOUNTER — Ambulatory Visit (INDEPENDENT_AMBULATORY_CARE_PROVIDER_SITE_OTHER): Payer: 59 | Admitting: Obstetrics and Gynecology

## 2017-04-28 ENCOUNTER — Encounter: Payer: Self-pay | Admitting: Obstetrics and Gynecology

## 2017-04-28 ENCOUNTER — Other Ambulatory Visit (HOSPITAL_COMMUNITY)
Admission: RE | Admit: 2017-04-28 | Discharge: 2017-04-28 | Disposition: A | Discharge status: Home / Self Care | Payer: 59 | Source: Ambulatory Visit | Attending: Obstetrics and Gynecology | Admitting: Obstetrics and Gynecology

## 2017-04-28 ENCOUNTER — Other Ambulatory Visit: Payer: Self-pay

## 2017-04-28 VITALS — BP 116/66 | HR 92 | Resp 16 | Ht 67.0 in | Wt 345.0 lb

## 2017-04-28 DIAGNOSIS — N898 Other specified noninflammatory disorders of vagina: Secondary | ICD-10-CM | POA: Diagnosis not present

## 2017-04-28 DIAGNOSIS — N921 Excessive and frequent menstruation with irregular cycle: Secondary | ICD-10-CM

## 2017-04-28 DIAGNOSIS — Z113 Encounter for screening for infections with a predominantly sexual mode of transmission: Secondary | ICD-10-CM | POA: Diagnosis not present

## 2017-04-28 DIAGNOSIS — Z01419 Encounter for gynecological examination (general) (routine) without abnormal findings: Secondary | ICD-10-CM

## 2017-04-28 DIAGNOSIS — N841 Polyp of cervix uteri: Secondary | ICD-10-CM | POA: Diagnosis not present

## 2017-04-28 NOTE — Progress Notes (Signed)
32 y.o. G0P0000 [name] here as a new patient for an annual exam and heavy cycles.    Cycle becoming heavier.  Pad change every 30 - 45 minutes.  Menses are crampy with passing clots. Bleeding can last for 2 - 3 weeks and spotting in between.  Tried Mirena IUDs twice (pain and swelling - one in for 2 weeks and one in for 2 months), birth control, Evra, Provera. Told she has PCOS.  When her prolactin level was elevated, she would skip her cycles.  Dx made in 2017.   Has sickle cell train.  Hgb 9.7 with oncology on 04/13/17. States her baseline can range from 8 - 11.   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  Prolactin 4.5, FSH 5.3, and LH 15.95 on 02/11/17.   TSH 1.19 on 09/20/16.   From [place].  Works as a Marketing executive at the [employer]  PCP: Grier Mitts, MD   Patient's last menstrual period was 03/08/2017 (within weeks).     Period Cycle (Days): (irregular) Period Duration (Days): 2 weeks Period Pattern: (!) Irregular Menstrual Flow: Heavy Menstrual Control: Maxi pad Menstrual Control Change Freq (Hours): 45 Dysmenorrhea: (!) Severe Dysmenorrhea Symptoms: Cramping, Headache(fatigue)     Sexually active: Yes.   Female partner.  The current method of family planning is condoms most of the time.    Exercising: No.  The patient does not participate in regular exercise at present. Smoker:  no  Health Maintenance: Pap:  About 2017 - normal per patient History of abnormal Pap:  no TDaP:  unsure Gardasil:   Patient has only had 1 at age 32 HIV: negative in the past Hep C: negative in the past Screening Labs:  Sees Oncology   reports that she has never smoked. She has never used smokeless tobacco. She reports that she drinks alcohol. She reports that she does not use drugs.  Past Medical History:  Diagnosis Date  . Adenoma of pituitary ([place]) 08/2014  . Anemia   . IBS (irritable bowel syndrome)   . Sickle cell trait [facility])     Past Surgical History:   Procedure Laterality Date  . BLEPHAROPLASTY Right    lower lid x 3  . CHOLECYSTECTOMY  2016    Current Outpatient Medications  Medication Sig Dispense Refill  . cabergoline (DOSTINEX) 0.5 MG tablet Take 0.5 tablets (0.25 mg total) by mouth once a week. 10 tablet 2  . cetirizine (ZYRTEC) 10 MG tablet Take 10 mg by mouth daily at 10 pm.    . Cholecalciferol (VITAMIN D PO) Take by mouth.    . colestipol (COLESTID) 1 g tablet Take 2 tablets (2 g total) by mouth 2 (two) times daily. 120 tablet 2  . Ferrous Sulfate (IRON SUPPLEMENT PO) Take by mouth.     No current facility-administered medications for this visit.     Family History  Adopted: Yes  Problem Relation Age of Onset  . Sickle cell trait Mother   . Heart disease Father     Review of Systems  Constitutional: Negative.   HENT: Negative.   Eyes: Negative.   Respiratory: Negative.   Cardiovascular: Negative.   Gastrointestinal: Negative.   Endocrine: Negative.   Genitourinary:       Vaginal odor Irregular menses Heavy menses  Musculoskeletal: Negative.   Skin: Negative.   Allergic/Immunologic: Negative.   Neurological: Negative.   Hematological: Negative.   Psychiatric/Behavioral: Negative.     Exam:   BP 116/66 (BP Location:  Right Arm, Patient Position: Sitting, Cuff Size: Large)   Pulse 92   Resp 16   Ht 5\' 7"  (1.702 m)   Wt (!) 345 lb (156.5 kg)   LMP 03/08/2017 (Within Weeks)   BMI 54.03 kg/m     General appearance: alert, cooperative and appears stated age Head: Normocephalic, without obvious abnormality, atraumatic Neck: no adenopathy, supple, symmetrical, trachea midline and thyroid normal to inspection and palpation Lungs: clear to auscultation bilaterally Breasts: normal appearance, no masses or tenderness, No nipple retraction or dimpling, No nipple discharge or bleeding, No axillary or supraclavicular adenopathy Heart: regular rate and rhythm Abdomen: soft, non-tender; no masses, no  organomegaly Extremities: extremities normal, atraumatic, no cyanosis or edema Skin: Skin color, texture, turgor normal. No rashes or lesions Lymph nodes: Cervical, supraclavicular, and axillary nodes normal. No abnormal inguinal nodes palpated Neurologic: Grossly normal  Pelvic: External genitalia:  no lesions              Urethra:  normal appearing urethra with no masses, tenderness or lesions              Bartholins and Skenes: normal                 Vagina: normal appearing vagina with normal color and discharge, no lesions              Cervix:  Cervical polyp.  Patient declines removal today.               Pap taken: Yes.   Bimanual Exam:  Uterus:  normal size, contour, position, consistency, mobility, non-tender              Adnexa: no mass, fullness, tenderness              Chaperone was present for exam.  Assessment:   Well woman visit with normal exam. Pituitary adenoma.  On Dostinex.  Hx sickle cell trait.  Vaginal odor.  Hx BV.  Anemia and menorrhagia with irregular cycles. Morbid obesity.   Plan: Mammogram screening. Recommended self breast awareness. Pap and HR HPV as above. Guidelines for Calcium, Vitamin D, regular exercise program including cardiovascular and weight bearing exercise. STD screening.  Vaginitis testing from pap.  Referral to nutrition counseling.  Return for pelvic ultrasound, sonohysterogram, EMB, cervical polyp removal.  Procedures explained.   Follow up annually and prn.   After visit summary provided.

## 2017-04-28 NOTE — Patient Instructions (Signed)

## 2017-04-28 NOTE — Addendum Note (Signed)
Addended by: Yisroel Ramming, Dietrich Pates E on: 04/28/2017 03:40 PM   Modules accepted: Orders

## 2017-04-29 ENCOUNTER — Encounter: Payer: 59 | Admitting: Family Medicine

## 2017-04-29 LAB — HEP, RPR, HIV PANEL
HIV Screen 4th Generation wRfx: NONREACTIVE
Hepatitis B Surface Ag: NEGATIVE
RPR Ser Ql: NONREACTIVE

## 2017-04-29 LAB — HEPATITIS C ANTIBODY: Hep C Virus Ab: <0.1 s/co ratio (ref 0.0–0.9)

## 2017-05-02 ENCOUNTER — Telehealth: Payer: Self-pay | Admitting: Obstetrics and Gynecology

## 2017-05-02 LAB — CYTOLOGY - PAP
Bacterial vaginitis: NEGATIVE
Candida vaginitis: NEGATIVE
Chlamydia: NEGATIVE
Diagnosis: NEGATIVE
HPV: NOT DETECTED
Neisseria Gonorrhea: NEGATIVE
Trichomonas: NEGATIVE

## 2017-05-02 NOTE — Telephone Encounter (Signed)
Spoke with patient regarding benefit for an ultrasound with a possible sonohysterogram and endometrial biopsy. Patient understood benefit information. Patient advises she had a cycle a couple of months ago, but states she feels like she is getting ready to start her cycle. Advised patient to call at the onset of her cycle for scheduling.  Forwarding to Dr Quincy Simmonds for review

## 2017-05-02 NOTE — Telephone Encounter (Signed)
Triage will call patient to assess her menstruation,potential pregnancy status, and assist with planning of her ultrasound/EMB visit.  Thank you.

## 2017-05-03 NOTE — Telephone Encounter (Signed)
Attempted to reach patient at number provided [phone], there was no answer and recording states that the voicemail box is full.

## 2017-05-03 NOTE — Telephone Encounter (Signed)
Spoke with patient. Advised of results as seen below from [place]. Patient verbalizes understanding. Patient has not gotten her menses and will take UPT. Advised will review with Dr.Silva regarding [facility] scheduling as this is usually scheduled right after menses.02 recall placed.

## 2017-05-03 NOTE — Telephone Encounter (Signed)
-----   Message from Nunzio Cobbs, MD sent at 05/02/2017  7:37 PM EDT ----- Please contact patient in follow up to her recent visit and to share results.  Her pap and HR HPV are negative and normal.  Pap recall - 02.  Testing is negative for GC/CT/trichomonas/yeast/BV.  I would like to have her do a pregnancy test if she has not gotten her period yet.  We need to plan for her return visit for her pelvic US/sonohyserterogram/EMB visit.

## 2017-05-03 NOTE — Telephone Encounter (Signed)
Patient is skipping cycles and then when they occur having prolonged bleeding.  We will wait for her result of her UPT and assess for risk of pregnancy based on last sexual activity.  We may then have to do a serum hCG and if it is negative, induce a cycle with Provera.

## 2017-05-04 ENCOUNTER — Ambulatory Visit: Payer: 59 | Admitting: Internal Medicine

## 2017-05-04 NOTE — Telephone Encounter (Signed)
Spoke with patient. Patient states that she took UPT and it was negative. Started spotting this morning. Appointment for PUS/SHGM/EMB scheduled for 05/12/2017 at 9 am with 9:30 am consult with Dr.Silva. Patient verbalizes understanding. Patient will contact the office if she is still bleeding close to the day of her appointment as she has prolonged cycles.  Routing to provider for final review. Patient agreeable to disposition. Will close encounter.

## 2017-05-11 ENCOUNTER — Inpatient Hospital Stay: Payer: 59 | Attending: Hematology

## 2017-05-11 DIAGNOSIS — N921 Excessive and frequent menstruation with irregular cycle: Secondary | ICD-10-CM | POA: Insufficient documentation

## 2017-05-11 DIAGNOSIS — D500 Iron deficiency anemia secondary to blood loss (chronic): Secondary | ICD-10-CM | POA: Insufficient documentation

## 2017-05-11 DIAGNOSIS — D573 Sickle-cell trait: Secondary | ICD-10-CM | POA: Insufficient documentation

## 2017-05-12 ENCOUNTER — Encounter

## 2017-05-12 ENCOUNTER — Ambulatory Visit (INDEPENDENT_AMBULATORY_CARE_PROVIDER_SITE_OTHER): Payer: 59

## 2017-05-12 ENCOUNTER — Encounter: Payer: 59 | Admitting: Obstetrics and Gynecology

## 2017-05-12 ENCOUNTER — Other Ambulatory Visit: Payer: Self-pay | Admitting: Obstetrics and Gynecology

## 2017-05-12 ENCOUNTER — Ambulatory Visit (INDEPENDENT_AMBULATORY_CARE_PROVIDER_SITE_OTHER): Payer: 59 | Admitting: Obstetrics and Gynecology

## 2017-05-12 ENCOUNTER — Encounter: Payer: Self-pay | Admitting: Obstetrics and Gynecology

## 2017-05-12 ENCOUNTER — Other Ambulatory Visit: Payer: Self-pay

## 2017-05-12 VITALS — BP 118/80 | HR 76 | Resp 16 | Ht 67.0 in | Wt 343.0 lb

## 2017-05-12 DIAGNOSIS — N921 Excessive and frequent menstruation with irregular cycle: Secondary | ICD-10-CM

## 2017-05-12 DIAGNOSIS — N841 Polyp of cervix uteri: Secondary | ICD-10-CM | POA: Diagnosis not present

## 2017-05-12 DIAGNOSIS — N719 Inflammatory disease of uterus, unspecified: Secondary | ICD-10-CM | POA: Diagnosis not present

## 2017-05-12 LAB — POCT URINE PREGNANCY: Preg Test, Ur: NEGATIVE

## 2017-05-12 MED ORDER — MEDROXYPROGESTERONE ACETATE 10 MG PO TABS
10.0000 mg | ORAL_TABLET | Freq: Every day | ORAL | 0 refills | Status: DC
Start: 2017-05-12 — End: 2017-06-09

## 2017-05-12 NOTE — Patient Instructions (Signed)

## 2017-05-12 NOTE — Progress Notes (Signed)
GYNECOLOGY  VISIT   HPI: 32 y.o.   Single  African American  female   [place] with Patient's last menstrual period was 05/04/2017.   here for sonohysterogram/emb.  On day 9 of bleeding now.  Changing pad every 2 hours.  Tried multiple OCPs to regulate cycles - Ortho Novum, Ortho Tricyclen, Seasonique, LoEstrin, Seasonale.    Used Mirena and Ortho Evra in the past.   Considering childbearing in the next year.   Has lost 10 pounds and happy with this.   Worried about cancer.   UPT negative.   GYNECOLOGIC HISTORY: Patient's last menstrual period was 05/04/2017. Contraception:  condoms Menopausal hormone therapy:  n/a Last mammogram:  n/a Last pap smear:   04/28/17 Pap and HR HPV negative        OB History    Gravida  0   Para  0   Term  0   Preterm  0   AB  0   Living  0     SAB  0   TAB  0   Ectopic  0   Multiple  0   Live Births  0              Patient Active Problem List   Diagnosis Date Noted  . Menorrhagia with irregular cycle 04/28/2017  . Cervical polyp 04/28/2017  . Morbid obesity ([place]) 04/28/2017  . Hyperprolactinemia ([place]) 02/11/2017  . Pituitary adenoma ([place]) 02/11/2017  . Sickle cell trait ([place]) 02/01/2017  . Iron deficiency anemia due to chronic blood loss 11/04/2016    Past Medical History:  Diagnosis Date  . Adenoma of pituitary ([place]) 08/2014  . Anemia   . IBS (irritable bowel syndrome)   . Sickle cell trait [facility])     Past Surgical History:  Procedure Laterality Date  . BLEPHAROPLASTY Right    lower lid x 3  . CHOLECYSTECTOMY  2016    Current Outpatient Medications  Medication Sig Dispense Refill  . cabergoline (DOSTINEX) 0.5 MG tablet Take 0.5 tablets (0.25 mg total) by mouth once a week. 10 tablet 2  . cetirizine (ZYRTEC) 10 MG tablet Take 10 mg by mouth daily at 10 pm.    . Cholecalciferol (VITAMIN D PO) Take by mouth.    . colestipol (COLESTID) 1 g tablet Take 2 tablets (2 g total) by mouth 2 (two) times  daily. 120 tablet 2  . Ferrous Sulfate (IRON SUPPLEMENT PO) Take by mouth.     No current facility-administered medications for this visit.      ALLERGIES: Food  Family History  Adopted: Yes  Problem Relation Age of Onset  . Sickle cell trait Mother   . Heart disease Father     Social History   Socioeconomic History  . Marital status: Single    Spouse name: Not on file  . Number of children: 0  . Years of education: Not on file  . Highest education level: Not on file  Occupational History  . Occupation: Engineer, maintenance (IT)  Social Needs  . Financial resource strain: Not on file  . Food insecurity:    Worry: Not on file    Inability: Not on file  . Transportation needs:    Medical: Not on file    Non-medical: Not on file  Tobacco Use  . Smoking status: Never Smoker  . Smokeless tobacco: Never Used  Substance and Sexual Activity  . Alcohol use: Yes    Comment: socially -1 per week  .  Drug use: No  . Sexual activity: Yes    Birth control/protection: Condom  Lifestyle  . Physical activity:    Days per week: Not on file    Minutes per session: Not on file  . Stress: Not on file  Relationships  . Social connections:    Talks on phone: Not on file    Gets together: Not on file    Attends religious service: Not on file    Active member of club or organization: Not on file    Attends meetings of clubs or organizations: Not on file    Relationship status: Not on file  . Intimate partner violence:    Fear of current or ex partner: Not on file    Emotionally abused: Not on file    Physically abused: Not on file    Forced sexual activity: Not on file  Other Topics Concern  . Not on file  Social History Narrative  . Not on file    Review of Systems  Constitutional: Negative.   HENT: Negative.   Eyes: Negative.   Respiratory: Negative.   Cardiovascular: Negative.   Gastrointestinal: Negative.   Endocrine: Negative.   Genitourinary: Negative.     Musculoskeletal: Negative.   Skin: Negative.   Allergic/Immunologic: Negative.   Neurological: Negative.   Hematological: Negative.   Psychiatric/Behavioral: Negative.     PHYSICAL EXAMINATION:    BP 118/80 (BP Location: Right Arm, Patient Position: Sitting, Cuff Size: Large)   Pulse 76   Resp 16   Ht 5\' 7"  (1.702 m)   Wt (!) 343 lb (155.6 kg)   LMP 05/04/2017   BMI 53.72 kg/m     General appearance: alert, cooperative and appears stated age   Pelvic US Uterus with no masses.  EMS 21.88.  Ovaries normal.  Left ovary with 23 mm folicle.  No free fluid.   Sonohysterogram/EMB Consent for procedures.  Sterile prep of cervix with Hibiclens.  Cannula placed and sterile saline injected inside uterine cavity.  Clot noted inside uterine cavity that moved around with the procedure.  No solid masses noted.  Reprep of cervix with Hibiclens.  Paracervical block with 10 cc 1% lidocaine - lot number __[id number]__, expiration__1/23._ Tenaculum to anterior cervical lip.  Cervical polyp removed with ring forceps and sent to pathology.  Pipelle to 8 cm twice, and tissue to pathology.  No complications.  Minimal EBL.   Chaperone was present for exam.  ASSESSMENT  Menorrhagia with irregular menses.  Thickened endometrium.  Desire for future fertility.  Anemia.   Obesity.   PLAN  We discussed abnormal uterine bleeding, endometrial hyperplasia.  Provera 10 mg x 10 days.  FU EMB and cervical polyp pathology.  Instructions/precautions given.  We will determine her final plan after her reports are back.  I would not recommend Depo Provera if she is planning on fertility in the near future.  Aygestin may be a good choice.  I briefly mentioned dilation and curettage.  We reviewed the benefits of weight loss to reduce risk of endometrial hyperplasia and cancer, reduce risk of cardiovascular disease and diabetes, and improve fertility and pregnancy outcome.    An After Visit Summary  was printed and given to the patient.  __25____ minutes face to face time of which over 50% was spent in counseling.

## 2017-05-12 NOTE — Progress Notes (Signed)
Opened in error

## 2017-05-17 ENCOUNTER — Telehealth: Payer: Self-pay | Admitting: Obstetrics and Gynecology

## 2017-05-17 NOTE — Telephone Encounter (Signed)
Patient is asking to talk with a nurse about her heavy bleeding. Patient feels this is urgent.

## 2017-05-17 NOTE — Telephone Encounter (Signed)
Attempted to reach patient at number provided. There was no answer and no voicemail box to leave a message.  Seen on 05/12/2017. last menstrual period was 05/04/2017.  On day 9 of bleeding on 5/9/20119. Was changing pad every 2 hours. Has tried multiple OCPs to regulate cycles - Ortho Novum, Ortho Tricyclen, Seasonique, LoEstrin, Seasonale.    Used Mirena and Ortho Evra in the past. Negative UPT. Given Provera 10 mg x 10 days. EMB and cervical polyp removed at that appointment. Thickened endometrium.

## 2017-05-17 NOTE — Telephone Encounter (Signed)
Spoke with patient. Advised reviewed with Dr.Silva who would like her to go ahead and start taking Provera 10 mg to help slow her bleeding. Advised will touch base with her this afternoon regarding EMB and cervical polyp pathology to discuss next steps in care. Patient is agreeable.

## 2017-05-17 NOTE — Telephone Encounter (Signed)
Spoke with patient. Advised of message as seen below from [place]. Patient verbalizes understanding. Recheck appointment schedule for 5/16 at 8:45 am with Dr.Silva. Patient needs earlier morning and this is the only early appointment available. Is aware she will be scheduled for D&C on 05/24/2017 at 11:15 am so that this slot can be reserved. Patient verbalizes understanding. Aware this may be cancelled depending on follow up appointment with Dr.Silva.  Cc: Lamont Snowball, RN

## 2017-05-17 NOTE — Telephone Encounter (Signed)
I have just spoken with the pathologist who read her specimens.  The endocervical polyp is benign.  Her endometrial biopsy is also benign and shows disordered proliferative endometrium.  There is no sign of cancer or hyperplasia.   Please have her start taking the Provera 10 mg daily and follow up with me this week for a recheck and CBC.  If her bleeding does not respond well to the Provera, I am recommending a dilation and curettage procedure.

## 2017-05-17 NOTE — Telephone Encounter (Signed)
Call to patient to discuss surgery scheduling options. Voice mailbox is full. Unable to leave message.

## 2017-05-17 NOTE — Telephone Encounter (Signed)
Spoke with patient. Patient states that she started her cycle on 05/04/2017 and has not stopped bleeding. Is changing a super pad every hour due to bleeding through. This morning she was passing clots that are 1/2 the size of her palm. Is feeling more tired. Denies light headedness, dizziness or weakness. Has not pick up Provera that was prescribed 05/12/2017 by Dr.Silva. Had a thickened endometrium on SHGM. EMB was performed 05/12/2017. Results are now available in Epic. Advised she needs to pick up rx for Provera as soon as possible. Advised will review with Dr. Quincy Simmonds and return call.

## 2017-05-17 NOTE — Telephone Encounter (Signed)
I will reassess her bleeding at her office visit this week, and we can then make any necessary plans.   Cc- Lamont Snowball

## 2017-05-19 ENCOUNTER — Encounter: Payer: Self-pay | Admitting: Obstetrics and Gynecology

## 2017-05-19 ENCOUNTER — Ambulatory Visit (INDEPENDENT_AMBULATORY_CARE_PROVIDER_SITE_OTHER): Payer: 59 | Admitting: Obstetrics and Gynecology

## 2017-05-19 ENCOUNTER — Other Ambulatory Visit: Payer: Self-pay | Admitting: Hematology

## 2017-05-19 ENCOUNTER — Other Ambulatory Visit: Payer: Self-pay

## 2017-05-19 VITALS — BP 118/72 | HR 76 | Resp 16 | Ht 67.0 in | Wt 337.0 lb

## 2017-05-19 DIAGNOSIS — D649 Anemia, unspecified: Secondary | ICD-10-CM

## 2017-05-19 DIAGNOSIS — N921 Excessive and frequent menstruation with irregular cycle: Secondary | ICD-10-CM

## 2017-05-19 MED ORDER — NORETHINDRONE 0.35 MG PO TABS: 1.0000 | ORAL_TABLET | Freq: Every day | ORAL | 1 refills | Status: DC

## 2017-05-19 NOTE — Telephone Encounter (Signed)
See office note from 05-19-17. Encounter closed.

## 2017-05-19 NOTE — Progress Notes (Signed)
GYNECOLOGY  VISIT   HPI: 32 y.o.   Single  African American  female   [place] with Patient's last menstrual period was 05/04/2017.   here for f/u for heavy and prolonged bleeding.   Started Provera 3 days ago, and bleeding is now lessened and darker.  3 days ago the bleeding was excessive.  Now doing pad change every 3 - 4 hours.  Not passing bid clots.   No dizziness, lightheadedness, shortness of breath, or headache.   Considering childbearing in the next couple of years.  Has tried multiple pills and progesterone IUD.  Still had bleeding issues.   GYNECOLOGIC HISTORY: Patient's last menstrual period was 05/04/2017. Contraception:  Condoms Menopausal hormone therapy:  n/a Last mammogram:  n/a Last pap smear:   04/28/17 Pap and HR HPV negative        OB History    Gravida  0   Para  0   Term  0   Preterm  0   AB  0   Living  0     SAB  0   TAB  0   Ectopic  0   Multiple  0   Live Births  0              Patient Active Problem List   Diagnosis Date Noted  . Menorrhagia with irregular cycle 04/28/2017  . Cervical polyp 04/28/2017  . Morbid obesity ([place]) 04/28/2017  . Hyperprolactinemia ([place]) 02/11/2017  . Pituitary adenoma ([place]) 02/11/2017  . Sickle cell trait ([place]) 02/01/2017  . Iron deficiency anemia due to chronic blood loss 11/04/2016    Past Medical History:  Diagnosis Date  . Adenoma of pituitary ([place]) 08/2014  . Anemia   . IBS (irritable bowel syndrome)   . Sickle cell trait [facility])     Past Surgical History:  Procedure Laterality Date  . BLEPHAROPLASTY Right    lower lid x 3  . CHOLECYSTECTOMY  2016    Current Outpatient Medications  Medication Sig Dispense Refill  . cabergoline (DOSTINEX) 0.5 MG tablet Take 0.5 tablets (0.25 mg total) by mouth once a week. 10 tablet 2  . cetirizine (ZYRTEC) 10 MG tablet Take 10 mg by mouth daily at 10 pm.    . Cholecalciferol (VITAMIN D PO) Take by mouth.    . colestipol (COLESTID) 1 g tablet  Take 2 tablets (2 g total) by mouth 2 (two) times daily. 120 tablet 2  . Ferrous Sulfate (IRON SUPPLEMENT PO) Take by mouth.    . medroxyPROGESTERone (PROVERA) 10 MG tablet Take 1 tablet (10 mg total) by mouth daily. 10 tablet 0   No current facility-administered medications for this visit.      ALLERGIES: Food  Family History  Adopted: Yes  Problem Relation Age of Onset  . Sickle cell trait Mother   . Heart disease Father     Social History   Socioeconomic History  . Marital status: Single    Spouse name: Not on file  . Number of children: 0  . Years of education: Not on file  . Highest education level: Not on file  Occupational History  . Occupation: Engineer, maintenance (IT)  Social Needs  . Financial resource strain: Not on file  . Food insecurity:    Worry: Not on file    Inability: Not on file  . Transportation needs:    Medical: Not on file    Non-medical: Not on file  Tobacco Use  . Smoking status: Never  Smoker  . Smokeless tobacco: Never Used  Substance and Sexual Activity  . Alcohol use: Yes    Comment: socially -1 per week  . Drug use: No  . Sexual activity: Yes    Birth control/protection: Condom  Lifestyle  . Physical activity:    Days per week: Not on file    Minutes per session: Not on file  . Stress: Not on file  Relationships  . Social connections:    Talks on phone: Not on file    Gets together: Not on file    Attends religious service: Not on file    Active member of club or organization: Not on file    Attends meetings of clubs or organizations: Not on file    Relationship status: Not on file  . Intimate partner violence:    Fear of current or ex partner: Not on file    Emotionally abused: Not on file    Physically abused: Not on file    Forced sexual activity: Not on file  Other Topics Concern  . Not on file  Social History Narrative  . Not on file    Review of Systems  Constitutional: Negative.   HENT: Negative.   Eyes:  Negative.   Respiratory: Negative.   Cardiovascular: Negative.   Gastrointestinal: Negative.   Endocrine: Negative.   Genitourinary: Negative.   Musculoskeletal: Negative.   Skin: Negative.   Allergic/Immunologic: Negative.   Neurological: Negative.   Hematological: Negative.   Psychiatric/Behavioral: Negative.     PHYSICAL EXAMINATION:    BP 118/72 (BP Location: Right Arm, Patient Position: Sitting, Cuff Size: Large)   Pulse 76   Resp 16   Ht 5\' 7"  (1.702 m)   Wt (!) 337 lb (152.9 kg)   LMP 05/04/2017   BMI 52.78 kg/m     General appearance: alert, cooperative and appears stated age   Pelvic: External genitalia:  no lesions              Urethra:  normal appearing urethra with no masses, tenderness or lesions              Bartholins and Skenes: normal                 Vagina: normal appearing vagina with normal color and discharge, no lesions              Cervix: no lesions.  Small clot at cx os.                 Bimanual Exam:  Uterus:  normal size, contour, position, consistency, mobility, non-tender              Adnexa: no mass, fullness, tenderness               Chaperone was present for exam.  ASSESSMENT  Menorrhagia with irregular cycles.  Responding to Provera.   PLAN  Finish course of Provera.  Will then start Micronor.  Instructed in use.  Check Hgb today - 9.1. Will run formal CBC.  Take Fe Bid. FU 3 months.    An After Visit Summary was printed and given to the patient.  ___15___ minutes face to face time of which over 50% was spent in counseling.

## 2017-05-19 NOTE — Patient Instructions (Signed)
Norethindrone tablets (contraception) What is this medicine? NORETHINDRONE (nor eth IN drone) is an oral contraceptive. The product contains a female hormone known as a progestin. It is used to prevent pregnancy. This medicine may be used for other purposes; ask your health care provider or pharmacist if you have questions. COMMON BRAND NAME(S): Camila, Deblitane 28-Day, Errin, Heather, [place], Jolivette, [place], Nor-QD, Nora-BE, Norlyroc, Ortho Micronor, [employer] 28-Day What should I tell my health care provider before I take this medicine? They need to know if you have any of these conditions: -blood vessel disease or blood clots -breast, cervical, or vaginal cancer -diabetes -heart disease -kidney disease -liver disease -mental depression -migraine -seizures -stroke -vaginal bleeding -an unusual or allergic reaction to norethindrone, other medicines, foods, dyes, or preservatives -pregnant or trying to get pregnant -breast-feeding How should I use this medicine? Take this medicine by mouth with a glass of water. You may take it with or without food. Follow the directions on the prescription label. Take this medicine at the same time each day and in the order directed on the package. Do not take your medicine more often than directed. Contact your pediatrician regarding the use of this medicine in children. Special care may be needed. This medicine has been used in female children who have started having menstrual periods. A patient package insert for the product will be given with each prescription and refill. Read this sheet carefully each time. The sheet may change frequently. Overdosage: If you think you have taken too much of this medicine contact a poison control center or emergency room at once. NOTE: This medicine is only for you. Do not share this medicine with others. What if I miss a dose? Try not to miss a dose. Every time you miss a dose or take a dose late your chance of  pregnancy increases. When 1 pill is missed (even if only 3 hours late), take the missed pill as soon as possible and continue taking a pill each day at the regular time (use a back up method of birth control for the next 48 hours). If more than 1 dose is missed, use an additional birth control method for the rest of your pill pack until menses occurs. Contact your health care professional if more than 1 dose has been missed. What may interact with this medicine? Do not take this medicine with any of the following medications: -amprenavir or fosamprenavir -bosentan This medicine may also interact with the following medications: -antibiotics or medicines for infections, especially rifampin, rifabutin, rifapentine, and griseofulvin, and possibly penicillins or tetracyclines -aprepitant -barbiturate medicines, such as phenobarbital -carbamazepine -felbamate -modafinil -oxcarbazepine -phenytoin -ritonavir or other medicines for HIV infection or AIDS -St. John's wort -topiramate This list may not describe all possible interactions. Give your health care provider a list of all the medicines, herbs, non-prescription drugs, or dietary supplements you use. Also tell them if you smoke, drink alcohol, or use illegal drugs. Some items may interact with your medicine. What should I watch for while using this medicine? Visit your doctor or health care professional for regular checks on your progress. You will need a regular breast and pelvic exam and Pap smear while on this medicine. Use an additional method of birth control during the first cycle that you take these tablets. If you have any reason to think you are pregnant, stop taking this medicine right away and contact your doctor or health care professional. If you are taking this medicine for hormone related problems, it  may take several cycles of use to see improvement in your condition. This medicine does not protect you against HIV infection (AIDS)  or any other sexually transmitted diseases. What side effects may I notice from receiving this medicine? Side effects that you should report to your doctor or health care professional as soon as possible: -breast tenderness or discharge -pain in the abdomen, chest, groin or leg -severe headache -skin rash, itching, or hives -sudden shortness of breath -unusually weak or tired -vision or speech problems -yellowing of skin or eyes Side effects that usually do not require medical attention (report to your doctor or health care professional if they continue or are bothersome): -changes in sexual desire -change in menstrual flow -facial hair growth -fluid retention and swelling -headache -irritability -nausea -weight gain or loss This list may not describe all possible side effects. Call your doctor for medical advice about side effects. You may report side effects to FDA at 1-800-FDA-1088. Where should I keep my medicine? Keep out of the reach of children. Store at room temperature between 15 and 30 degrees C (59 and 86 degrees F). Throw away any unused medicine after the expiration date. NOTE: This sheet is a summary. It may not cover all possible information. If you have questions about this medicine, talk to your doctor, pharmacist, or health care provider.  2018 Elsevier/Gold Standard (2011-09-10 16:41:35)  

## 2017-05-20 LAB — CBC
Hematocrit: 28.7 % — ABNORMAL LOW (ref 34.0–46.6)
Hemoglobin: 9.3 g/dL — ABNORMAL LOW (ref 11.1–15.9)
MCH: 23.1 pg — ABNORMAL LOW (ref 26.6–33.0)
MCHC: 32.4 g/dL (ref 31.5–35.7)
MCV: 71 fL — ABNORMAL LOW (ref 79–97)
Platelets: 520 10*3/uL — ABNORMAL HIGH (ref 150–379)
RBC: 4.02 x10E6/uL (ref 3.77–5.28)
RDW: 16.6 % — ABNORMAL HIGH (ref 12.3–15.4)
WBC: 7.4 10*3/uL (ref 3.4–10.8)

## 2017-05-22 NOTE — Addendum Note (Signed)
Addended by: Yisroel Ramming, BROOK E on: 05/22/2017 10:04 AM   Modules accepted: Orders

## 2017-05-23 ENCOUNTER — Telehealth: Payer: Self-pay | Admitting: *Deleted

## 2017-05-23 NOTE — Telephone Encounter (Signed)
Notes recorded by Burnice Logan, RN on 05/23/2017 at 1:58 PM EDT Left message to call Sharee Pimple at [phone].

## 2017-05-23 NOTE — Telephone Encounter (Signed)
-----   Message from Nunzio Cobbs, MD sent at 05/22/2017 10:04 AM EDT ----- Please contact patient back regarding her formal CBC result.  Her hemoglobin is 9.3, and her platelet count is elevated, likely due to her anemia. I recommend she take her iron twice daily with orange juice.  She needs a recheck of her CBC in one month.  Please schedule. I will place a future order for a CBC, iron, and ferritin.   She was to start her Micronor and do a trial for 3 months.  Please schedule her 3 month follow up visit with me.   If she does not have good control of her cycles with less bleeding and organized bleeding once per month, I will need to do a dilation and curettage.

## 2017-05-23 NOTE — Telephone Encounter (Signed)
Spoke with patient, advised as seen below per Dr. Quincy Simmonds. Lab appt scheduled for 6/21 at 3:30pm and OV scheduled with Dr. Quincy Simmonds on 8/30 at 4pm. Patient verbalizes understanding and is agreeable. Encounter closed.

## 2017-05-31 ENCOUNTER — Inpatient Hospital Stay: Payer: 59

## 2017-05-31 DIAGNOSIS — D500 Iron deficiency anemia secondary to blood loss (chronic): Secondary | ICD-10-CM | POA: Diagnosis not present

## 2017-05-31 DIAGNOSIS — D573 Sickle-cell trait: Secondary | ICD-10-CM | POA: Diagnosis not present

## 2017-05-31 DIAGNOSIS — N921 Excessive and frequent menstruation with irregular cycle: Secondary | ICD-10-CM | POA: Diagnosis not present

## 2017-05-31 DIAGNOSIS — E559 Vitamin D deficiency, unspecified: Secondary | ICD-10-CM

## 2017-05-31 LAB — CBC WITH DIFFERENTIAL/PLATELET
Basophils Absolute: 0 10*3/uL (ref 0.0–0.1)
Basophils Relative: 0 %
Eosinophils Absolute: 0.2 10*3/uL (ref 0.0–0.5)
Eosinophils Relative: 3 %
HCT: 33.1 % — ABNORMAL LOW (ref 34.8–46.6)
Hemoglobin: 10.2 g/dL — ABNORMAL LOW (ref 11.6–15.9)
Lymphocytes Relative: 20 %
Lymphs Abs: 1.7 10*3/uL (ref 0.9–3.3)
MCH: 22.9 pg — ABNORMAL LOW (ref 25.1–34.0)
MCHC: 30.8 g/dL — ABNORMAL LOW (ref 31.5–36.0)
MCV: 74.2 fL — ABNORMAL LOW (ref 79.5–101.0)
Monocytes Absolute: 0.3 10*3/uL (ref 0.1–0.9)
Monocytes Relative: 4 %
Neutro Abs: 6.3 10*3/uL (ref 1.5–6.5)
Neutrophils Relative %: 73 %
Platelets: 411 10*3/uL — ABNORMAL HIGH (ref 145–400)
RBC: 4.46 MIL/uL (ref 3.70–5.45)
RDW: 18.3 % — ABNORMAL HIGH (ref 11.2–14.5)
WBC: 8.5 10*3/uL (ref 3.9–10.3)

## 2017-05-31 LAB — IRON AND TIBC
Iron: 36 ug/dL — ABNORMAL LOW (ref 41–142)
Saturation Ratios: 11 % — ABNORMAL LOW (ref 21–57)
TIBC: 320 ug/dL (ref 236–444)
UIBC: 284 ug/dL

## 2017-05-31 LAB — FERRITIN: Ferritin: 149 ng/mL (ref 9–269)

## 2017-05-31 LAB — RETICULOCYTES
RBC.: 4.46 MIL/uL (ref 3.70–5.45)
Retic Count, Absolute: 142.7 10*3/uL — ABNORMAL HIGH (ref 33.7–90.7)
Retic Ct Pct: 3.2 % — ABNORMAL HIGH (ref 0.7–2.1)

## 2017-06-01 LAB — VITAMIN D 25 HYDROXY (VIT D DEFICIENCY, FRACTURES): Vit D, 25-Hydroxy: 21.9 ng/mL — ABNORMAL LOW (ref 30.0–100.0)

## 2017-06-02 ENCOUNTER — Encounter: Payer: Self-pay | Admitting: Dietician

## 2017-06-02 ENCOUNTER — Encounter: Payer: 59 | Attending: Obstetrics and Gynecology | Admitting: Dietician

## 2017-06-02 DIAGNOSIS — Z713 Dietary counseling and surveillance: Secondary | ICD-10-CM | POA: Diagnosis not present

## 2017-06-02 NOTE — Progress Notes (Signed)
  Medical Nutrition Therapy:  Appt start time: 6606 end time:  1700.   Assessment:  Primary concerns today: Obesity.  Patient lives with her boyfriend and step-son.  Patient does the food shopping, her and her boyfriend both do the food cooking. They eat together as a family in the dining room with the TV on.  She describes herself as a fast eater and often eats until overly full.  She states that her hunger level later in the day is often ravenous. They eat out 4-5x/week.  At work she eats lunch and sometimes breakfast in the cafeteria.  Diet recall reveals high intake of sugary beverages, frequent evening snacking, and balanced meals with at least 1 serving of veggies when she is "trying to eat well."    She would like to get to a healthy weight and be able to maintain her changes versus going on a strict diet.  She has been successful with weight loss in the past when following a strict plan but the quick weight loss was regained over time when she returned to old habits.  We discussed and set some attainable goals today and will be following up regularly.     Preferred Learning Style:  No preference indicated   Learning Readiness:  Ready  MEDICATIONS: see list   DIETARY INTAKE:  Usual eating pattern includes 3 meals and 3-4 snacks per day.  24-hr recall:  B (9 AM): sausage, eggs, and a biscuit or oatmeal with almonds and brown sugar, or skips, water, soda   Snk ( AM): not usually L (12 PM): meat, side salad with Ranch, cooked veggies or chicken sandwich with bacon and cheese and side salad, soda or juice or sweet tea Snk ( PM): cookies, chips, or granola bars D (7-8 PM): meat, starch, and veggie or salad, or fast-food, water and sweetened beverage Snk ( PM): cookies, chips, or granola bars Beverages: 48-64 oz. water, 3-4 sodas, Hi-C or [place] juice, 1-2 shots of liquor a few days a week after work  Usual physical activity: not currently, used to like taking Zumba and yoga, has  access to a gym at work and her apt complex   Progress Towards Goal(s):  In progress.   Nutritional Diagnosis:  [place]-3.3 Overweight/obesity As related to excess calorie intake.  As evidenced by diet recall including high intake of sugary beverages and processed foods.    Intervention:  Nutrition education and counseling. Discussed appetite management, meal timing, mindful eating, goal setting, and motivation.  Gathered nutrition assessment and used motivational interviewing to help promote confidence in habit changes and highlight change talk.  Assisted the patient in setting up her realistic behavior goals for [place] to track together.  Individualized Goals Set by the Patient: - Exercise 3x/week for 60 min - Stick to the individualized eating schedule, listening to hunger and fullness cues throughout the day - Limit sugary beverages to one 16-20 oz. bottle per day - Next visit discuss tips and strategies for dealing with cravings (during menstrual cycle)  Teaching Method Utilized: Visual Auditory Hands on  Handouts given during visit include:  Nutrition Strategies for Weight Loss  Personalized Meal Timing Plan  Barriers to learning/adherence to lifestyle change: none  Demonstrated degree of understanding via:  Teach Back   Monitoring/Evaluation:  Dietary intake, exercise, progress towards goals, and body weight in 1 month(s).

## 2017-06-06 ENCOUNTER — Inpatient Hospital Stay: Payer: 59 | Attending: Hematology

## 2017-06-06 VITALS — BP 99/61 | HR 79 | Temp 98.6°F | Resp 17

## 2017-06-06 DIAGNOSIS — N920 Excessive and frequent menstruation with regular cycle: Secondary | ICD-10-CM | POA: Diagnosis not present

## 2017-06-06 DIAGNOSIS — D500 Iron deficiency anemia secondary to blood loss (chronic): Secondary | ICD-10-CM | POA: Insufficient documentation

## 2017-06-06 MED ORDER — SODIUM CHLORIDE 0.9 % IV SOLN
510.0000 mg | Freq: Once | INTRAVENOUS | Status: AC
  Administered 2017-06-06: 510 mg via INTRAVENOUS
  Filled 2017-06-06: qty 17

## 2017-06-06 NOTE — Patient Instructions (Signed)

## 2017-06-07 ENCOUNTER — Other Ambulatory Visit: Payer: 59

## 2017-06-07 ENCOUNTER — Telehealth: Payer: Self-pay | Admitting: Obstetrics and Gynecology

## 2017-06-07 NOTE — Telephone Encounter (Signed)
Left message to call Cailah Reach at [phone]. 

## 2017-06-07 NOTE — Telephone Encounter (Signed)
Spoke with patient, advised as seen below per Dr. Quincy Simmonds. OV scheduled for 06/09/17 at 3:45pm. Patient declined OV for 6/5 d/t work schedule.   Routing to provider for final review. Patient is agreeable to disposition. Will close encounter.

## 2017-06-07 NOTE — Telephone Encounter (Signed)
Spoke with patient. Took provera 10mg  x 10days, started Micronor with menses on 05/23/17. Menses started again on 06/04/17. Patient is pleased that bleeding has slowed with POP, changing reg pad q2 hrs, pad is not saturated. Some small, thin clots, "nothing like before". Has an occasional "gush" of blood.  Patient reports a shooting pelvic cramp when she experiences "gush of blood", pain 9/10, pain then resolves. Pain currently 0/10. Pain started after starting POP. Patient denies any other gyn symptoms. Has not been SA since last UPT in office. Regular bowel movements.  Iron infusion on 06/06/17.   Advised patient to continue to monitor, will need OV if pain becomes severe, does not resolve, bleeding becomes heavy, or new symptoms develop. Advised patient will review with Dr. Quincy Simmonds and return call if any additional recommendations. Patient complementary of Dr. Quincy Simmonds and [facility].  Dr. Quincy Simmonds -please review.

## 2017-06-07 NOTE — Telephone Encounter (Signed)
Patient is asking to talk with Dr.Silva's nurse about some "cramping after starting her birth control".

## 2017-06-07 NOTE — Telephone Encounter (Signed)
Left detailed message, ok per dpr. Advised as seen below per Dr. Quincy Simmonds, return call Sharee Pimple at [facility] to assist with scheduling.

## 2017-06-07 NOTE — Telephone Encounter (Signed)
Please make an appointment for patient to see me.  We need to reassess regarding hysteroscopy with dilation and curettage.

## 2017-06-08 ENCOUNTER — Other Ambulatory Visit: Payer: 59

## 2017-06-09 ENCOUNTER — Other Ambulatory Visit: Payer: Self-pay

## 2017-06-09 ENCOUNTER — Encounter: Payer: Self-pay | Admitting: Obstetrics and Gynecology

## 2017-06-09 ENCOUNTER — Ambulatory Visit: Payer: 59 | Admitting: Registered"

## 2017-06-09 ENCOUNTER — Ambulatory Visit (INDEPENDENT_AMBULATORY_CARE_PROVIDER_SITE_OTHER): Payer: 59 | Admitting: Obstetrics and Gynecology

## 2017-06-09 VITALS — BP 120/76 | HR 84 | Resp 16 | Ht 67.0 in | Wt 343.0 lb

## 2017-06-09 DIAGNOSIS — N921 Excessive and frequent menstruation with irregular cycle: Secondary | ICD-10-CM

## 2017-06-09 DIAGNOSIS — R102 Pelvic and perineal pain: Secondary | ICD-10-CM

## 2017-06-09 DIAGNOSIS — D649 Anemia, unspecified: Secondary | ICD-10-CM

## 2017-06-09 LAB — POCT URINE PREGNANCY: Preg Test, Ur: NEGATIVE

## 2017-06-09 NOTE — Progress Notes (Signed)
GYNECOLOGY  VISIT   HPI: 32 y.o.   Single  African American  female   [place] with Patient's last menstrual period was 06/04/2017 (within weeks).   here for pelvic cramping. Patient requested UPT.  Her for follow up of Micronor.  Has heavy and irregular cycles.  Hx frequent pad changed every 30 - 45 minutes and prolonged bleeding episodes lasting 2 - 3 weeks with intermenstrual spotting.  Has had long standing cycle problems and has not done well with multiple methods - Mirena, OCPs, Ortho Evra, Provera.  Pelvic US 05/12/17 showed thickened endometrium measuring 21.88 mm, no uterine fibroids, and ovaries that were normal. Sonohysterogram revealed clot inside the uterine cavity and no endometrial masses.  EMB disordered endometrium with polypoid endometrium with necrosis.   Pathologist confirmed by phone call with me on 05/17/17, no cancer or hyperplasia seen.   Patient took 10 day course of Provera 10 mg and then bled 2 days later. She started the Micronor in follow up and she continues to bleed to this day.  Is taking the Micronor now for 2 weeks and no skipped or late doses. Bleeding has lessened, but when the cramps come she does have clots.  Has shooting pain.    No headaches.  No SOB.   Had an iron infusion this week.   UPT: negative  GYNECOLOGIC HISTORY: Patient's last menstrual period was 06/04/2017 (within weeks). Contraception:  Micronor Menopausal hormone therapy:  n/a Last mammogram:  n/a Last pap smear:   04/28/17 Pap and HR HPV negative        OB History    Gravida  0   Para  0   Term  0   Preterm  0   AB  0   Living  0     SAB  0   TAB  0   Ectopic  0   Multiple  0   Live Births  0              Patient Active Problem List   Diagnosis Date Noted  . Menorrhagia with irregular cycle 04/28/2017  . Cervical polyp 04/28/2017  . Morbid obesity ([place]) 04/28/2017  . Hyperprolactinemia ([place]) 02/11/2017  . Pituitary adenoma ([place]) 02/11/2017  .  Sickle cell trait ([place]) 02/01/2017  . Iron deficiency anemia due to chronic blood loss 11/04/2016    Past Medical History:  Diagnosis Date  . Adenoma of pituitary ([place]) 08/2014  . Anemia   . IBS (irritable bowel syndrome)   . Sickle cell trait [facility])     Past Surgical History:  Procedure Laterality Date  . BLEPHAROPLASTY Right    lower lid x 3  . CHOLECYSTECTOMY  2016    Current Outpatient Medications  Medication Sig Dispense Refill  . cabergoline (DOSTINEX) 0.5 MG tablet Take 0.5 tablets (0.25 mg total) by mouth once a week. 10 tablet 2  . cetirizine (ZYRTEC) 10 MG tablet Take 10 mg by mouth daily at 10 pm.    . Cholecalciferol (VITAMIN D PO) Take by mouth.    . colestipol (COLESTID) 1 g tablet Take 2 tablets (2 g total) by mouth 2 (two) times daily. 120 tablet 2  . Ferrous Sulfate (IRON SUPPLEMENT PO) Take by mouth.    . norethindrone (MICRONOR,CAMILA,ERRIN) 0.35 MG tablet Take 1 tablet (0.35 mg total) by mouth daily. 3 Package 1   No current facility-administered medications for this visit.      ALLERGIES: Food  Family History  Adopted: Yes  Problem  Relation Age of Onset  . Sickle cell trait Mother   . Heart disease Father     Social History   Socioeconomic History  . Marital status: Single    Spouse name: Not on file  . Number of children: 0  . Years of education: Not on file  . Highest education level: Not on file  Occupational History  . Occupation: Engineer, maintenance (IT)  Social Needs  . Financial resource strain: Not on file  . Food insecurity:    Worry: Not on file    Inability: Not on file  . Transportation needs:    Medical: Not on file    Non-medical: Not on file  Tobacco Use  . Smoking status: Never Smoker  . Smokeless tobacco: Never Used  Substance and Sexual Activity  . Alcohol use: Yes    Comment: socially -1 per week  . Drug use: No  . Sexual activity: Yes    Birth control/protection: Condom, Pill  Lifestyle  . Physical activity:     Days per week: Not on file    Minutes per session: Not on file  . Stress: Not on file  Relationships  . Social connections:    Talks on phone: Not on file    Gets together: Not on file    Attends religious service: Not on file    Active member of club or organization: Not on file    Attends meetings of clubs or organizations: Not on file    Relationship status: Not on file  . Intimate partner violence:    Fear of current or ex partner: Not on file    Emotionally abused: Not on file    Physically abused: Not on file    Forced sexual activity: Not on file  Other Topics Concern  . Not on file  Social History Narrative  . Not on file    Review of Systems  Constitutional: Negative.   HENT: Negative.   Eyes: Negative.   Respiratory: Negative.   Cardiovascular: Negative.   Gastrointestinal: Negative.   Endocrine: Negative.   Genitourinary:       Pelvic cramping  Musculoskeletal: Negative.   Skin: Negative.   Allergic/Immunologic: Negative.   Neurological: Negative.   Hematological: Negative.   Psychiatric/Behavioral: Negative.     PHYSICAL EXAMINATION:    BP 120/76 (BP Location: Right Arm, Patient Position: Sitting, Cuff Size: Large)   Pulse 84   Resp 16   Ht 5\' 7"  (1.702 m)   Wt (!) 343 lb (155.6 kg)   LMP 06/04/2017 (Within Weeks)   BMI 53.72 kg/m     General appearance: alert, cooperative and appears stated age Head: Normocephalic, without obvious abnormality, atraumatic Lungs: clear to auscultation bilaterally Heart: regular rate and rhythm Abdomen: soft, non-tender, no masses,  no organomegaly Extremities: extremities normal, atraumatic, no cyanosis or edema  Neurologic: Grossly normal  Pelvic: External genitalia:  no lesions              Urethra:  normal appearing urethra with no masses, tenderness or lesions              Bartholins and Skenes: normal                 Vagina: normal appearing vagina with normal color and discharge, no lesions               Cervix: no lesions.  Blood present in the vagina.  Bimanual Exam:  Uterus:  normal size, contour, position, consistency, mobility, non-tender              Adnexa: no mass, fullness, tenderness         Chaperone was present for exam.  ASSESSMENT  Menorrhagia with irregular menses.    Ongoing bleeding with Provera challenge and then Micronor. Anemia.  Sickle cell trait.  Doing iron infusions.  Pituitary adenoma.  On Dostinex.  PLAN  We discussed her ongoing abnormal uterine bleeding and anemia. I am recommending hysteroscopy with possible Myosure resection of any endometrial masses/polyps, and dilation and curettage.  Risks, benefits, and alternatives reviewed. Risks include but are not limited to bleeding, infection, damage to surrounding organs including uterine perforation requiring hospitalization and laparoscopy, pulmonary edema, reaction to anesthesia, need for further treatment including medical or surgical therapy. Surgical expectations and recovery discussed.  Patient wishes to proceed. [facility] on hysteroscopy and dilation and curettage to patient.   An After Visit Summary was printed and given to the patient.  __25___ minutes face to face time of which over 50% was spent in counseling.

## 2017-06-13 ENCOUNTER — Telehealth: Payer: Self-pay | Admitting: Obstetrics and Gynecology

## 2017-06-13 NOTE — Telephone Encounter (Signed)
Patient is ready to schedule her D&C procedure.

## 2017-06-13 NOTE — Telephone Encounter (Signed)
Patient was seen on 06/09/2017 with Dr.Silva. Surgery was discussed. Routing to Lamont Snowball, RN and Dr.Silva for review of scheduling.

## 2017-06-13 NOTE — Telephone Encounter (Signed)
Patient called again requesting to schedule D&C procedure.

## 2017-06-13 NOTE — Telephone Encounter (Signed)
Spoke to patient. Discussed importance of surgical procedure as has failed medical management.  Discussed options and contact information for hospital business office.  Patient agreeable to proceed and possible date of 06-21-17. Patient will call back to confirm.

## 2017-06-13 NOTE — Telephone Encounter (Signed)
Spoke with patient regarding benefit for surgery. Patient understood and agreeable. Patient aware this is professional benefit only. Patient aware will be contacted by hospital for separate benefits. Patient advises she will call back to confirm   cc: Lamont Snowball

## 2017-06-13 NOTE — Telephone Encounter (Signed)
Thank you for the update!

## 2017-06-14 ENCOUNTER — Ambulatory Visit: Payer: 59 | Admitting: Internal Medicine

## 2017-06-14 ENCOUNTER — Ambulatory Visit: Payer: 59 | Admitting: Skilled Nursing Facility1

## 2017-06-14 NOTE — Telephone Encounter (Signed)
Call to patient. Surgery date changed to 06-27-17 at [facility] due to medical history. Patient agreeable to change. Surgery instruction sheet reviewed and printed copy will be mailed to patient.   Routing to provider for final review.  Will close encounter.

## 2017-06-14 NOTE — Telephone Encounter (Signed)
Patient called and confirmed she is ready to proceed with scheduling recommended surgery. Forwarding to Conservation officer, historic buildings for scheduling  Routing to Lamont Snowball, RN

## 2017-06-17 NOTE — Patient Instructions (Addendum)
Your procedure is scheduled on: Monday June 24 at 7:30 am  Enter through the [place] [facility] at: 6:00 am  Pick up the phone at the desk and dial [phone].  Call this number if you have problems the morning of surgery: [phone].  Remember: Do NOT eat food or drink any liquids after Midnight on Sunday June 23 :  Take these medicines the morning of surgery with a SIP OF WATER:  STOP ALL VITAMINS, SUPPLEMENTS, HERBAL MEDICATIONS NOW  DO NOT SMOKE DAY OF SURGERY  Do NOT wear jewelry (body piercing), metal hair clips/bobby pins, make-up, or nail polish. Do NOT wear lotions, powders, or perfumes.  You may wear deoderant. Do NOT shave for 48 hours prior to surgery. Do NOT bring valuables to the hospital. Contacts, dentures, or bridgework may not be worn into surgery.  Have a responsible adult drive you home and stay with you for 24 hours after your procedure

## 2017-06-20 ENCOUNTER — Telehealth: Payer: Self-pay | Admitting: Obstetrics and Gynecology

## 2017-06-20 MED ORDER — MEDROXYPROGESTERONE ACETATE 10 MG PO TABS: 10.0000 mg | ORAL_TABLET | Freq: Every day | ORAL | 0 refills | Status: DC

## 2017-06-20 NOTE — Telephone Encounter (Signed)
Stop Micronor.  Start Provera 10 mg daily x 8 days.  Start today.  Do UPT first if sexually active.  Keep appointment for tomorrow.

## 2017-06-20 NOTE — Telephone Encounter (Signed)
Patient stated that she has a D&C scheduled for Monday, but is bleeding a lot more than she was previously. She is currently bleeding through a pad everything hour and a half to two hours. She stated that she is exhausted and isn't sure that she can wait until Monday.

## 2017-06-20 NOTE — Telephone Encounter (Signed)
Spoke with patient. Patient reports bleeding has increased to changing regular pad q1.5 -2hrs. Fatigue increased. Reports lightheadedness. Ate breakfast this morning. Cramping 8/10, alternating motrin and tylenol, no relief. Scheduled for D&C on 06/27/17. Micronor for contraceptive.   Advised OV needed for further evaluation, patient declined. Patient states she is unable to leave work, requesting 6/18 morning OV. Advised patient OV recommended for today given change in symptoms and hx of anemia, patient again declined.   Advised will review with Dr. Quincy Simmonds and return call.   Dr. Quincy Simmonds -please review and advise?

## 2017-06-20 NOTE — Telephone Encounter (Signed)
Spoke with patient, advised as seen below per Dr. Quincy Simmonds.   Patient has pre-op testing scheduled for 6/18 at 10:30am at [facility].   OV scheduled with Dr. Quincy Simmonds on 6/18 at 8:45am with Dr. Quincy Simmonds.   Provera Rx to verified pharmacy.   Routing to provider for final review. Patient is agreeable to disposition. Will close encounter.

## 2017-06-21 ENCOUNTER — Other Ambulatory Visit: Payer: Self-pay

## 2017-06-21 ENCOUNTER — Encounter: Payer: Self-pay | Admitting: Obstetrics and Gynecology

## 2017-06-21 ENCOUNTER — Ambulatory Visit (INDEPENDENT_AMBULATORY_CARE_PROVIDER_SITE_OTHER): Payer: 59 | Admitting: Obstetrics and Gynecology

## 2017-06-21 VITALS — BP 120/80 | HR 80 | Resp 16 | Ht 67.0 in | Wt 344.0 lb

## 2017-06-21 DIAGNOSIS — N921 Excessive and frequent menstruation with irregular cycle: Secondary | ICD-10-CM

## 2017-06-21 MED ORDER — MEDROXYPROGESTERONE ACETATE 10 MG PO TABS
ORAL_TABLET | ORAL | 0 refills | Status: DC
Start: 2017-06-21 — End: 2017-06-27

## 2017-06-21 NOTE — Progress Notes (Signed)
GYNECOLOGY  VISIT   HPI: 32 y.o.   Single  African American  female   [place] with Patient's last menstrual period was 06/04/2017 (within weeks).   here for irregular bleeding.  Patient was on Micronor and continues to have bleeding.  During this last weekend, started bleeding more.  Having cramping and bleeding through sheets.  Not feeling very well.   Feeling fatigue.  No shortness of breath or palpitations.  Some dizziness.   Called in yesterday, and I prescribed Provera 10 mg daily and she stopped the Micronor.  Today, the bleeding is continuing but the blood clots are not as large.  Pad change every 2 hours.   Has her presurgical appointment at hospital today at 10:30.   Scheduled for a hysteroscopy with dilation and curettage in 6 days.  GYNECOLOGIC HISTORY: Patient's last menstrual period was 06/04/2017 (within weeks). Contraception:  condoms Menopausal hormone therapy:  n/a Last mammogram:  n/a Last pap smear:    04/28/17 Pap and HR HPV negative        OB History    Gravida  0   Para  0   Term  0   Preterm  0   AB  0   Living  0     SAB  0   TAB  0   Ectopic  0   Multiple  0   Live Births  0              Patient Active Problem List   Diagnosis Date Noted  . Menorrhagia with irregular cycle 04/28/2017  . Cervical polyp 04/28/2017  . Morbid obesity ([place]) 04/28/2017  . Hyperprolactinemia ([place]) 02/11/2017  . Pituitary adenoma ([place]) 02/11/2017  . Sickle cell trait ([place]) 02/01/2017  . Iron deficiency anemia due to chronic blood loss 11/04/2016    Past Medical History:  Diagnosis Date  . Adenoma of pituitary ([place]) 08/2014  . Anemia   . IBS (irritable bowel syndrome)   . Sickle cell trait [facility])     Past Surgical History:  Procedure Laterality Date  . BLEPHAROPLASTY Right    lower lid x 3  . CHOLECYSTECTOMY  2016    Current Outpatient Medications  Medication Sig Dispense Refill  . budesonide (RHINOCORT ALLERGY) 32 MCG/ACT nasal  spray Place 1 spray into both nostrils daily.    . cabergoline (DOSTINEX) 0.5 MG tablet Take 0.5 tablets (0.25 mg total) by mouth once a week. (Patient taking differently: Take 0.25 mg by mouth 2 (two) times a week. ) 10 tablet 2  . cetirizine (ZYRTEC) 10 MG tablet Take 10 mg by mouth daily.     . cholecalciferol (VITAMIN D) 1000 units tablet Take 1,000 Units by mouth daily.     . colestipol (COLESTID) 1 g tablet Take 2 tablets (2 g total) by mouth 2 (two) times daily. (Patient taking differently: Take 1 g by mouth daily. ) 120 tablet 2  . ferrous sulfate 325 (65 FE) MG tablet Take 325 mg by mouth 2 (two) times a week.    . medroxyPROGESTERone (PROVERA) 10 MG tablet Take 1 tablet (10 mg total) by mouth daily for 8 days. 8 tablet 0  . Tetrahydrozoline HCl (VISINE OP) Place 1 drop into both eyes daily.     No current facility-administered medications for this visit.      ALLERGIES: Food  Family History  Adopted: Yes  Problem Relation Age of Onset  . Sickle cell trait Mother   . Heart disease Father  Social History   Socioeconomic History  . Marital status: Single    Spouse name: Not on file  . Number of children: 0  . Years of education: Not on file  . Highest education level: Not on file  Occupational History  . Occupation: Engineer, maintenance (IT)  Social Needs  . Financial resource strain: Not on file  . Food insecurity:    Worry: Not on file    Inability: Not on file  . Transportation needs:    Medical: Not on file    Non-medical: Not on file  Tobacco Use  . Smoking status: Never Smoker  . Smokeless tobacco: Never Used  Substance and Sexual Activity  . Alcohol use: Yes    Comment: socially -1 per week  . Drug use: No  . Sexual activity: Yes    Birth control/protection: Condom, Pill  Lifestyle  . Physical activity:    Days per week: Not on file    Minutes per session: Not on file  . Stress: Not on file  Relationships  . Social connections:    Talks on phone:  Not on file    Gets together: Not on file    Attends religious service: Not on file    Active member of club or organization: Not on file    Attends meetings of clubs or organizations: Not on file    Relationship status: Not on file  . Intimate partner violence:    Fear of current or ex partner: Not on file    Emotionally abused: Not on file    Physically abused: Not on file    Forced sexual activity: Not on file  Other Topics Concern  . Not on file  Social History Narrative  . Not on file    Review of Systems  Constitutional: Negative.   HENT: Negative.   Eyes: Negative.   Respiratory: Negative.   Cardiovascular: Negative.   Gastrointestinal: Negative.   Endocrine: Negative.   Genitourinary: Positive for vaginal bleeding.  Musculoskeletal: Negative.   Skin: Negative.   Allergic/Immunologic: Negative.   Neurological: Negative.   Hematological: Negative.   Psychiatric/Behavioral: Negative.     PHYSICAL EXAMINATION:    BP 120/80 (BP Location: Right Arm, Patient Position: Sitting, Cuff Size: Large)   Pulse 80   Resp 16   Ht 5\' 7"  (1.702 m)   Wt (!) 344 lb (156 kg)   LMP 06/04/2017 (Within Weeks)   BMI 53.88 kg/m     General appearance: alert, cooperative and appears stated age   Pelvic: External genitalia:  no lesions              Urethra:  normal appearing urethra with no masses, tenderness or lesions              Bartholins and Skenes: normal                 Vagina: normal appearing vagina with normal color and discharge, no lesions              Cervix: no lesions.  Clot at internal os.                 Bimanual Exam:  Uterus:  normal size, contour, position, consistency, mobility, non-tender              Adnexa: no mass, fullness, tenderness               Chaperone was present for exam.  ASSESSMENT  Menorrhagia with irregular  menses.   PLAN  Check Hgb now - 9.4. Proceed with preop at hospital.  Will increase to Provera 20 mg daily.  Call if bleeding  does not improve or if develops worsening symptoms.  Iron bid.   An After Visit Summary was printed and given to the patient.  ___15___ minutes face to face time of which over 50% was spent in counseling.

## 2017-06-22 ENCOUNTER — Other Ambulatory Visit: Payer: Self-pay

## 2017-06-22 ENCOUNTER — Encounter (HOSPITAL_COMMUNITY): Payer: Self-pay

## 2017-06-22 ENCOUNTER — Telehealth: Payer: Self-pay | Admitting: Obstetrics and Gynecology

## 2017-06-22 ENCOUNTER — Encounter (HOSPITAL_COMMUNITY)
Admission: RE | Admit: 2017-06-22 | Discharge: 2017-06-22 | Disposition: A | Discharge status: Home / Self Care | Payer: 59 | Source: Ambulatory Visit | Attending: Obstetrics and Gynecology | Admitting: Obstetrics and Gynecology

## 2017-06-22 ENCOUNTER — Inpatient Hospital Stay (HOSPITAL_COMMUNITY)
Admission: RE | Admit: 2017-06-22 | Discharge: 2017-06-22 | Disposition: A | Discharge status: Home / Self Care | Payer: 59 | Source: Ambulatory Visit

## 2017-06-22 DIAGNOSIS — Z01818 Encounter for other preprocedural examination: Secondary | ICD-10-CM | POA: Insufficient documentation

## 2017-06-22 LAB — CBC
HCT: 31.9 % — ABNORMAL LOW (ref 36.0–46.0)
Hemoglobin: 9.9 g/dL — ABNORMAL LOW (ref 12.0–15.0)
MCH: 22.8 pg — ABNORMAL LOW (ref 26.0–34.0)
MCHC: 31 g/dL (ref 30.0–36.0)
MCV: 73.5 fL — ABNORMAL LOW (ref 78.0–100.0)
Platelets: 481 10*3/uL — ABNORMAL HIGH (ref 150–400)
RBC: 4.34 MIL/uL (ref 3.87–5.11)
RDW: 17.7 % — ABNORMAL HIGH (ref 11.5–15.5)
WBC: 9.1 10*3/uL (ref 4.0–10.5)

## 2017-06-22 LAB — BASIC METABOLIC PANEL
Anion gap: 10 (ref 5–15)
BUN: 9 mg/dL (ref 6–20)
CO2: 25 mmol/L (ref 22–32)
Calcium: 9.2 mg/dL (ref 8.9–10.3)
Chloride: 104 mmol/L (ref 101–111)
Creatinine, Ser: 0.69 mg/dL (ref 0.44–1.00)
GFR calc Af Amer: >60 mL/min (ref >60)
GFR calc non Af Amer: >60 mL/min (ref >60)
Glucose, Bld: 101 mg/dL — ABNORMAL HIGH (ref 65–99)
Potassium: 3.8 mmol/L (ref 3.5–5.1)
Sodium: 139 mmol/L (ref 135–145)

## 2017-06-22 NOTE — Telephone Encounter (Signed)
Ana Padilla with [facility] called to verify directions on Provera 10mg  since patient was given Rx on 6-17 and 6-18. Advised we increased patient to a total of 20mg  daily for 8 days.

## 2017-06-22 NOTE — Pre-Procedure Instructions (Signed)
Dr. Josephine Igo spoke to patient at pre op. Aware of history

## 2017-06-22 NOTE — Patient Instructions (Signed)
Your procedure is scheduled on: Monday June 27, 2017 at 7:30 am  Enter through the Main Entrance of [facility] at:6:00 am  Pick up the phone at the desk and dial [phone].  Call this number if you have problems the morning of surgery: [phone].  Remember: Do NOT eat food or drink any liquids after: Midnight on Sunday June 23  Take these medicines the morning of surgery with a SIP OF WATER: Dost nix, zyrtec, Colestid, Medroxyprogesterone,   Rhinocort Spray, Tetrahydrozoline eye drops  STOP ALL VITAMINS, SUPPLEMENTS, HERBAL MEDICATIONS NOW  DO NOT SMOKE DAY OF SURGERY   TO BRUSH YOUR TEETH DAY OF SURGERY  Do NOT wear jewelry (body piercing), metal hair clips/bobby pins, make-up, or nail polish. Do NOT wear lotions, powders, or perfumes.  You may wear deoderant. Do NOT shave for 48 hours prior to surgery. Do NOT bring valuables to the hospital. Contacts, dentures, or bridgework may not be worn into surgery.  Have a responsible adult drive you home and stay with you for 24 hours after your procedure

## 2017-06-22 NOTE — Telephone Encounter (Signed)
Ana Padilla at [facility] is calling with questions about some prescriptions on this patient.

## 2017-06-24 ENCOUNTER — Other Ambulatory Visit: Payer: Self-pay

## 2017-06-25 ENCOUNTER — Encounter (HOSPITAL_COMMUNITY): Payer: Self-pay | Admitting: Anesthesiology

## 2017-06-25 NOTE — Anesthesia Preprocedure Evaluation (Addendum)
Anesthesia Evaluation  Patient identified by MRN, date of birth, ID band Patient awake    Reviewed: Allergy & Precautions, NPO status , Patient's Chart, lab work & pertinent test results  Airway Mallampati: II  TM Distance: >3 FB Neck ROM: Full    Dental no notable dental hx. (+) Teeth Intact   Pulmonary  Hx/o tuberculosis   Pulmonary exam normal breath sounds clear to auscultation       Cardiovascular negative cardio ROS Normal cardiovascular exam Rhythm:Regular Rate:Normal     Neuro/Psych negative neurological ROS  negative psych ROS   GI/Hepatic Neg liver ROS, IBS   Endo/Other  Morbid obesityHyperprolactinemia Pituitary adenoma  Renal/GU negative Renal ROS  negative genitourinary   Musculoskeletal negative musculoskeletal ROS (+)   Abdominal (+) + obese,   Peds  Hematology  (+) Sickle cell trait and anemia ,   Anesthesia Other Findings   Reproductive/Obstetrics Menorrhagia Thickened endometrium                            Anesthesia Physical Anesthesia Plan  ASA: III  Anesthesia Plan: General   Post-op Pain Management:    Induction: Intravenous  PONV Risk Score and Plan: 4 or greater and Scopolamine patch - Pre-op, Midazolam, Ondansetron, Dexamethasone and Treatment may vary due to age or medical condition  Airway Management Planned: LMA  Additional Equipment:   Intra-op Plan:   Post-operative Plan: Extubation in OR  Informed Consent: I have reviewed the patients History and Physical, chart, labs and discussed the procedure including the risks, benefits and alternatives for the proposed anesthesia with the patient or authorized representative who has indicated his/her understanding and acceptance.   Dental advisory given  Plan Discussed with: CRNA, Surgeon and Anesthesiologist  Anesthesia Plan Comments:        Anesthesia Quick Evaluation

## 2017-06-27 ENCOUNTER — Ambulatory Visit (HOSPITAL_COMMUNITY): Payer: 59 | Admitting: Anesthesiology

## 2017-06-27 ENCOUNTER — Encounter (HOSPITAL_COMMUNITY): Payer: Self-pay | Admitting: *Deleted

## 2017-06-27 ENCOUNTER — Ambulatory Visit (HOSPITAL_COMMUNITY)
Admission: AD | Admit: 2017-06-27 | Discharge: 2017-06-27 | Disposition: A | Discharge status: Home / Self Care | Payer: 59 | Source: Ambulatory Visit | Attending: Obstetrics and Gynecology | Admitting: Obstetrics and Gynecology

## 2017-06-27 ENCOUNTER — Other Ambulatory Visit: Payer: Self-pay

## 2017-06-27 ENCOUNTER — Encounter (HOSPITAL_COMMUNITY)
Admission: AD | Disposition: A | Discharge status: Home / Self Care | Payer: Self-pay | Source: Ambulatory Visit | Attending: Obstetrics and Gynecology

## 2017-06-27 DIAGNOSIS — Z8249 Family history of ischemic heart disease and other diseases of the circulatory system: Secondary | ICD-10-CM | POA: Diagnosis not present

## 2017-06-27 DIAGNOSIS — D352 Benign neoplasm of pituitary gland: Secondary | ICD-10-CM | POA: Diagnosis not present

## 2017-06-27 DIAGNOSIS — N926 Irregular menstruation, unspecified: Secondary | ICD-10-CM | POA: Diagnosis not present

## 2017-06-27 DIAGNOSIS — Z6841 Body Mass Index (BMI) 40.0 and over, adult: Secondary | ICD-10-CM | POA: Insufficient documentation

## 2017-06-27 DIAGNOSIS — D573 Sickle-cell trait: Secondary | ICD-10-CM | POA: Insufficient documentation

## 2017-06-27 DIAGNOSIS — Z79899 Other long term (current) drug therapy: Secondary | ICD-10-CM | POA: Diagnosis not present

## 2017-06-27 DIAGNOSIS — N920 Excessive and frequent menstruation with regular cycle: Secondary | ICD-10-CM | POA: Insufficient documentation

## 2017-06-27 DIAGNOSIS — N8500 Endometrial hyperplasia, unspecified: Secondary | ICD-10-CM | POA: Diagnosis not present

## 2017-06-27 DIAGNOSIS — K589 Irritable bowel syndrome without diarrhea: Secondary | ICD-10-CM | POA: Diagnosis not present

## 2017-06-27 DIAGNOSIS — N840 Polyp of corpus uteri: Secondary | ICD-10-CM | POA: Diagnosis not present

## 2017-06-27 DIAGNOSIS — D649 Anemia, unspecified: Secondary | ICD-10-CM | POA: Insufficient documentation

## 2017-06-27 DIAGNOSIS — D500 Iron deficiency anemia secondary to blood loss (chronic): Secondary | ICD-10-CM | POA: Diagnosis not present

## 2017-06-27 DIAGNOSIS — R9389 Abnormal findings on diagnostic imaging of other specified body structures: Secondary | ICD-10-CM | POA: Diagnosis not present

## 2017-06-27 DIAGNOSIS — N921 Excessive and frequent menstruation with irregular cycle: Secondary | ICD-10-CM | POA: Diagnosis not present

## 2017-06-27 HISTORY — PX: HYSTEROSCOPY WITH D & C: SHX1775

## 2017-06-27 HISTORY — DX: Other seasonal allergic rhinitis: J30.2

## 2017-06-27 LAB — PREGNANCY, URINE: Preg Test, Ur: NEGATIVE

## 2017-06-27 SURGERY — DILATATION AND CURETTAGE /HYSTEROSCOPY
Anesthesia: General

## 2017-06-27 MED ORDER — GLYCOPYRROLATE 0.2 MG/ML IJ SOLN
INTRAMUSCULAR | Status: AC
  Filled 2017-06-27: qty 1

## 2017-06-27 MED ORDER — LIDOCAINE HCL 1 % IJ SOLN
INTRAMUSCULAR | Status: DC | PRN
  Administered 2017-06-27: 10 mL

## 2017-06-27 MED ORDER — PROPOFOL 10 MG/ML IV BOLUS
INTRAVENOUS | Status: DC | PRN
  Administered 2017-06-27 (×2): 200 mg via INTRAVENOUS

## 2017-06-27 MED ORDER — MIDAZOLAM HCL 2 MG/2ML IJ SOLN
INTRAMUSCULAR | Status: DC | PRN
  Administered 2017-06-27 (×2): 1 mg via INTRAVENOUS

## 2017-06-27 MED ORDER — ONDANSETRON HCL 4 MG/2ML IJ SOLN
INTRAMUSCULAR | Status: DC | PRN
  Administered 2017-06-27: 4 mg via INTRAVENOUS

## 2017-06-27 MED ORDER — LACTATED RINGERS IV SOLN
INTRAVENOUS | Status: DC
  Administered 2017-06-27: 07:00:00 via INTRAVENOUS

## 2017-06-27 MED ORDER — MIDAZOLAM HCL 2 MG/2ML IJ SOLN
INTRAMUSCULAR | Status: AC
Start: 2017-06-27 — End: ?
  Filled 2017-06-27: qty 2

## 2017-06-27 MED ORDER — LIDOCAINE HCL (PF) 1 % IJ SOLN
INTRAMUSCULAR | Status: AC
  Filled 2017-06-27: qty 5

## 2017-06-27 MED ORDER — FENTANYL CITRATE (PF) 100 MCG/2ML IJ SOLN
25.0000 ug | INTRAMUSCULAR | Status: DC | PRN
  Administered 2017-06-27 (×2): 50 ug via INTRAVENOUS

## 2017-06-27 MED ORDER — SCOPOLAMINE 1 MG/3DAYS TD PT72
1.0000 | MEDICATED_PATCH | Freq: Once | TRANSDERMAL | Status: DC
  Administered 2017-06-27: 1.5 mg via TRANSDERMAL

## 2017-06-27 MED ORDER — IBUPROFEN 800 MG PO TABS: 800.0000 mg | ORAL_TABLET | Freq: Three times a day (TID) | ORAL | 0 refills | Status: DC | PRN

## 2017-06-27 MED ORDER — METOCLOPRAMIDE HCL 5 MG/ML IJ SOLN
10.0000 mg | Freq: Once | INTRAMUSCULAR | Status: AC | PRN
  Administered 2017-06-27: 10 mg via INTRAVENOUS

## 2017-06-27 MED ORDER — FENTANYL CITRATE (PF) 250 MCG/5ML IJ SOLN
INTRAMUSCULAR | Status: AC
  Filled 2017-06-27: qty 5

## 2017-06-27 MED ORDER — HYDROCODONE-ACETAMINOPHEN 7.5-325 MG PO TABS
ORAL_TABLET | ORAL | Status: AC
  Filled 2017-06-27: qty 1

## 2017-06-27 MED ORDER — DEXAMETHASONE SODIUM PHOSPHATE 4 MG/ML IJ SOLN
INTRAMUSCULAR | Status: AC
  Filled 2017-06-27: qty 1

## 2017-06-27 MED ORDER — FENTANYL CITRATE (PF) 100 MCG/2ML IJ SOLN
INTRAMUSCULAR | Status: DC | PRN
  Administered 2017-06-27 (×3): 50 ug via INTRAVENOUS

## 2017-06-27 MED ORDER — SODIUM CHLORIDE 0.9 % IR SOLN
Status: DC | PRN
  Administered 2017-06-27: 3000 mL

## 2017-06-27 MED ORDER — LIDOCAINE HCL (CARDIAC) PF 100 MG/5ML IV SOSY
PREFILLED_SYRINGE | INTRAVENOUS | Status: DC | PRN
  Administered 2017-06-27: 60 mg via INTRAVENOUS

## 2017-06-27 MED ORDER — HYDROCODONE-ACETAMINOPHEN 7.5-325 MG PO TABS
1.0000 | ORAL_TABLET | Freq: Once | ORAL | Status: AC | PRN
  Administered 2017-06-27: 1 via ORAL

## 2017-06-27 MED ORDER — LACTATED RINGERS IV SOLN: INTRAVENOUS | Status: DC

## 2017-06-27 MED ORDER — ONDANSETRON HCL 4 MG/2ML IJ SOLN
INTRAMUSCULAR | Status: AC
  Filled 2017-06-27: qty 2

## 2017-06-27 MED ORDER — METOCLOPRAMIDE HCL 5 MG/ML IJ SOLN
INTRAMUSCULAR | Status: AC
  Administered 2017-06-27: 10 mg via INTRAVENOUS
  Filled 2017-06-27: qty 2

## 2017-06-27 MED ORDER — KETOROLAC TROMETHAMINE 30 MG/ML IJ SOLN
INTRAMUSCULAR | Status: DC | PRN
  Administered 2017-06-27: 30 mg via INTRAVENOUS

## 2017-06-27 MED ORDER — DEXAMETHASONE SODIUM PHOSPHATE 10 MG/ML IJ SOLN
INTRAMUSCULAR | Status: DC | PRN
  Administered 2017-06-27: 10 mg via INTRAVENOUS

## 2017-06-27 MED ORDER — SCOPOLAMINE 1 MG/3DAYS TD PT72
MEDICATED_PATCH | TRANSDERMAL | Status: AC
  Administered 2017-06-27: 1.5 mg via TRANSDERMAL
  Filled 2017-06-27: qty 1

## 2017-06-27 MED ORDER — PROPOFOL 10 MG/ML IV BOLUS
INTRAVENOUS | Status: AC
  Filled 2017-06-27: qty 40

## 2017-06-27 MED ORDER — MEPERIDINE HCL 25 MG/ML IJ SOLN: 6.2500 mg | INTRAMUSCULAR | Status: DC | PRN

## 2017-06-27 MED ORDER — FENTANYL CITRATE (PF) 100 MCG/2ML IJ SOLN
INTRAMUSCULAR | Status: AC
  Administered 2017-06-27: 50 ug via INTRAVENOUS
  Filled 2017-06-27: qty 2

## 2017-06-27 MED ORDER — ACETAMINOPHEN 500 MG PO TABS: 500.0000 mg | ORAL_TABLET | Freq: Four times a day (QID) | ORAL | 0 refills | Status: AC | PRN

## 2017-06-27 SURGICAL SUPPLY — 15 items
CANISTER SUCT 3000ML PPV (MISCELLANEOUS) ×2 IMPLANT
CATH ROBINSON RED A/P 16FR (CATHETERS) ×2 IMPLANT
DEVICE MYOSURE LITE (MISCELLANEOUS) IMPLANT
DEVICE MYOSURE REACH (MISCELLANEOUS) IMPLANT
FILTER ARTHROSCOPY CONVERTOR (FILTER) ×2 IMPLANT
GLOVE BIO SURGEON STRL SZ 6.5 (GLOVE) ×2 IMPLANT
GLOVE BIOGEL PI IND STRL 7.0 (GLOVE) ×1 IMPLANT
GLOVE BIOGEL PI INDICATOR 7.0 (GLOVE) ×1
GOWN STRL REUS W/TWL LRG LVL3 (GOWN DISPOSABLE) ×4 IMPLANT
PACK VAGINAL MINOR WOMEN LF (CUSTOM PROCEDURE TRAY) ×2 IMPLANT
PAD OB MATERNITY 4.3X12.25 (PERSONAL CARE ITEMS) ×2 IMPLANT
SEAL ROD LENS SCOPE MYOSURE (ABLATOR) ×2 IMPLANT
TOWEL OR 17X24 6PK STRL BLUE (TOWEL DISPOSABLE) ×4 IMPLANT
TUBING AQUILEX INFLOW (TUBING) ×2 IMPLANT
TUBING AQUILEX OUTFLOW (TUBING) ×2 IMPLANT

## 2017-06-27 NOTE — Brief Op Note (Signed)
06/27/2017  8:00 AM  PATIENT:  Ana Padilla  31 y.o. female  PRE-OPERATIVE DIAGNOSIS:  menorrhagia, thickened endometrium, obesity  POST-OPERATIVE DIAGNOSIS:  menorrhagia, thickened endometrium, obesity  PROCEDURE:  Procedure(s): DILATATION & CURETTAGE/HYSTEROSCOPY (N/A)  SURGEON:  Surgeon(s) and Role:    * Amundson Raliegh Ip, MD - Primary  PHYSICIAN ASSISTANT:  NA  ASSISTANTS: none   ANESTHESIA:   paracervical block and LMA  EBL:  15 mL   BLOOD ADMINISTERED:none  DRAINS: none   LOCAL MEDICATIONS USED:  LIDOCAINE  and Amount: 10 ml  SPECIMEN:  Source of Specimen:  Endometrial curettings  DISPOSITION OF SPECIMEN:  PATHOLOGY  COUNTS:  YES  TOURNIQUET:  * No tourniquets in log *  DICTATION: .Note written in EPIC  PLAN OF CARE: Discharge to home after PACU  PATIENT DISPOSITION:  PACU - hemodynamically stable.   Delay start of Pharmacological VTE agent (>24hrs) due to surgical blood loss or risk of bleeding: not applicable

## 2017-06-27 NOTE — Transfer of Care (Signed)
Immediate Anesthesia Transfer of Care Note  Patient: Ana Padilla  Procedure(s) Performed: DILATATION & CURETTAGE/HYSTEROSCOPY (N/A )  Patient Location: PACU  Anesthesia Type:General  Level of Consciousness: awake, alert  and oriented  Airway & Oxygen Therapy: Patient Spontanous Breathing and Patient connected to face mask oxygen  Post-op Assessment: Report given to RN, Post -op Vital signs reviewed and stable and Patient moving all extremities X 4  Post vital signs: Reviewed and stable  Last Vitals:  Vitals Value Taken Time  BP 126/82 06/27/2017  8:15 AM  Temp 37 C 06/27/2017  8:07 AM  Pulse 94 06/27/2017  8:20 AM  Resp 20 06/27/2017  8:20 AM  SpO2 100 % 06/27/2017  8:20 AM  Vitals shown include unvalidated device data.  Last Pain:  Vitals:   06/27/17 0807  TempSrc:   PainSc: Asleep      Patients Stated Pain Goal: 4 (63/84/53 [id number])  Complications: No apparent anesthesia complications

## 2017-06-27 NOTE — Discharge Instructions (Signed)
Hysteroscopy, Care After Refer to this sheet in the next few weeks. These instructions provide you with information on caring for yourself after your procedure. Your health care provider may also give you more specific instructions. Your treatment has been planned according to current medical practices, but problems sometimes occur. Call your health care provider if you have any problems or questions after your procedure. What can I expect after the procedure? After your procedure, it is typical to have the following:  You may have some cramping. This normally lasts for a couple days.  You may have bleeding. This can vary from light spotting for a few days to menstrual-like bleeding for 3-7 days.  Follow these instructions at home:  Rest for the first 1-2 days after the procedure.  Only take over-the-counter or prescription medicines as directed by your health care provider. Do not take aspirin. It can increase the chances of bleeding.  Take showers instead of baths for 2 weeks or as directed by your health care provider.  Do not drive for 24 hours or as directed.  Do not drink alcohol while taking pain medicine.  Do not use tampons, douche, or have sexual intercourse for 2 weeks or until your health care provider says it is okay.  Take your temperature twice a day for 4-5 days. Write it down each time.  Follow your health care provider's advice about diet, exercise, and lifting.  If you develop constipation, you may: ? Take a mild laxative if your health care provider approves. ? Add bran foods to your diet. ? Drink enough fluids to keep your urine clear or pale yellow.  Try to have someone with you or available to you for the first 24-48 hours, especially if you were given a general anesthetic.  Follow up with your health care provider as directed. Contact a health care provider if:  You feel dizzy or lightheaded.  You feel sick to your stomach (nauseous).  You have  abnormal vaginal discharge.  You have a rash.  You have pain that is not controlled with medicine. Get help right away if:  You have bleeding that is heavier than a normal menstrual period.  You have a fever.  You have increasing cramps or pain, not controlled with medicine.  You have new belly (abdominal) pain.  You pass out.  You have pain in the tops of your shoulders (shoulder strap areas).  You have shortness of breath. This information is not intended to replace advice given to you by your health care provider. Make sure you discuss any questions you have with your health care provider. Document Released: 10/11/2012 Document Revised: 05/29/2015 Document Reviewed: 07/20/2012 Elsevier Interactive Patient Education  2017 [employer]   Dilation and Curettage or Vacuum Curettage, Care After These instructions give you information about caring for yourself after your procedure. Your doctor may also give you more specific instructions. Call your doctor if you have any problems or questions after your procedure. Follow these instructions at home: Activity  Do not drive or use heavy machinery while taking prescription pain medicine.  For 24 hours after your procedure, avoid driving.  Take short walks often, followed by rest periods. Ask your doctor what activities are safe for you. After one or two days, you may be able to return to your normal activities.  Do not lift anything that is heavier than 10 lb (4.5 kg) until your doctor approves.  For at least 2 weeks, or as long as told by your  doctor: ? Do not douche. ? Do not use tampons. ? Do not have sex. General instructions  Take over-the-counter and prescription medicines only as told by your doctor. This is very important if you take blood thinning medicine.  Do not take baths, swim, or use a hot tub until your doctor approves. Take showers instead of baths.  Wear compression stockings as told by your doctor.  It  is up to you to get the results of your procedure. Ask your doctor when your results will be ready.  Keep all follow-up visits as told by your doctor. This is important. Contact a doctor if:  You have very bad cramps that get worse or do not get better with medicine.  You have very bad pain in your belly (abdomen).  You cannot drink fluids without throwing up (vomiting).  You get pain in a different part of the area between your belly and thighs (pelvis).  You have bad-smelling discharge from your vagina.  You have a rash. Get help right away if:  You are bleeding a lot from your vagina. A lot of bleeding means soaking more than one sanitary pad in an hour, for 2 hours in a row.  You have clumps of blood (blood clots) coming from your vagina.  You have a fever or chills.  Your belly feels very tender or hard.  You have chest pain.  You have trouble breathing.  You cough up blood.  You feel dizzy.  You feel light-headed.  You pass out (faint).  You have pain in your neck or shoulder area. Summary  Take short walks often, followed by rest periods. Ask your doctor what activities are safe for you. After one or two days, you may be able to return to your normal activities.  Do not lift anything that is heavier than 10 lb (4.5 kg) until your doctor approves.  Do not take baths, swim, or use a hot tub until your doctor approves. Take showers instead of baths.  Contact your doctor if you have any symptoms of infection, like bad-smelling discharge from your vagina. This information is not intended to replace advice given to you by your health care provider. Make sure you discuss any questions you have with your health care provider. Document Released: 09/30/2007 Document Revised: 09/08/2015 Document Reviewed: 09/08/2015 Elsevier Interactive Patient Education  2017 [employer].

## 2017-06-27 NOTE — H&P (Signed)
Office Visit   06/09/2017 [name], [name], MD  Obstetrics and Gynecology   Menorrhagia with irregular cycle +2 more  Dx   Office Visit   ; Referred by [name], MD  Reason for Visit   Additional Documentation   Vitals:   BP 120/76 (BP Location: Right Arm, Patient Position: Sitting, Cuff Size: Large)   Pulse 84   Resp 16   Ht 5\' 7"  (1.702 m)   Wt 343 lb (155.6 kg)    LMP 06/04/2017 (Within Weeks)   BMI 53.72 kg/m   BSA 2.71 m   Flowsheets:   MEWS Score,   Anthropometrics     Encounter Info:   Billing Info,   History,   Allergies,   Detailed Report     Padilla Notes   Progress Notes by Nunzio Cobbs, MD at 06/09/2017 3:45 PM  Author: Nunzio Cobbs, MD Author Type: Physician Filed: 06/11/2017 5:41 PM  Note Status: Signed Cosign: Cosign Not Required Encounter Date: 06/09/2017  Editor: Nunzio Cobbs, MD (Physician)  Prior Versions: 1. Ana Padilla, CMA (Certified Psychologist, sport and exercise) at 06/09/2017 4:17 PM - Sign at close encounter    GYNECOLOGY  VISIT   HPI: 32 y.o.   Single  African American  female   [name] with Patient's last menstrual period was 06/04/2017 (within weeks).   here for pelvic cramping. Patient requested UPT.  Her for follow up of Micronor.  Has heavy and irregular cycles.  Hx frequent pad changed every 30 - 45 minutes and prolonged bleeding episodes lasting 2 - 3 weeks with intermenstrual spotting.  Has had long standing cycle problems and has not done well with multiple methods - Mirena, OCPs, Ortho Evra, Provera.  Pelvic US 05/12/17 showed thickened endometrium measuring 21.88 mm, no uterine fibroids, and ovaries that were normal. Sonohysterogram revealed clot inside the uterine cavity and no endometrial masses.  EMB disordered endometrium with polypoid endometrium with necrosis.   Pathologist confirmed by phone call with me on 05/17/17, no cancer or hyperplasia  seen.   Patient took 10 day course of Provera 10 mg and then bled 2 days later. She started the Micronor in follow up and she continues to bleed to this day.  Is taking the Micronor now for 2 weeks and no skipped or late doses. Bleeding has lessened, but when the cramps come she does have clots.  Has shooting pain.    No headaches.  No SOB.   Had an iron infusion this week.   UPT: negative  GYNECOLOGIC HISTORY: Patient's last menstrual period was 06/04/2017 (within weeks). Contraception:  Micronor Menopausal hormone therapy:  n/a Last mammogram:  n/a Last pap smear:   04/28/17 Pap and HR HPV negative                OB History    Gravida  0   Para  0   Term  0   Preterm  0   AB  0   Living  0     SAB  0   TAB  0   Ectopic  0   Multiple  0   Live Births  0                  Patient Active Problem List   Diagnosis Date Noted  . Menorrhagia with irregular cycle 04/28/2017  . Cervical polyp 04/28/2017  . Morbid  obesity ([place]) 04/28/2017  . Hyperprolactinemia ([place]) 02/11/2017  . Pituitary adenoma ([place]) 02/11/2017  . Sickle cell trait ([place]) 02/01/2017  . Iron deficiency anemia due to chronic blood loss 11/04/2016        Past Medical History:  Diagnosis Date  . Adenoma of pituitary ([place]) 08/2014  . Anemia   . IBS (irritable bowel syndrome)   . Sickle cell trait [facility])          Past Surgical History:  Procedure Laterality Date  . BLEPHAROPLASTY Right    lower lid x 3  . CHOLECYSTECTOMY  2016          Current Outpatient Medications  Medication Sig Dispense Refill  . cabergoline (DOSTINEX) 0.5 MG tablet Take 0.5 tablets (0.25 mg total) by mouth once a week. 10 tablet 2  . cetirizine (ZYRTEC) 10 MG tablet Take 10 mg by mouth daily at 10 pm.    . Cholecalciferol (VITAMIN D PO) Take by mouth.    . colestipol (COLESTID) 1 g tablet Take 2 tablets (2 g total) by mouth 2 (two) times daily. 120 tablet 2  . Ferrous Sulfate  (IRON SUPPLEMENT PO) Take by mouth.    . norethindrone (MICRONOR,CAMILA,ERRIN) 0.35 MG tablet Take 1 tablet (0.35 mg total) by mouth daily. 3 Package 1   No current facility-administered medications for this visit.      ALLERGIES: Food       Family History  Adopted: Yes  Problem Relation Age of Onset  . Sickle cell trait Mother   . Heart disease Father     Social History        Socioeconomic History  . Marital status: Single    Spouse name: Not on file  . Number of children: 0  . Years of education: Not on file  . Highest education level: Not on file  Occupational History  . Occupation: Engineer, maintenance (IT)  Social Needs  . Financial resource strain: Not on file  . Food insecurity:    Worry: Not on file    Inability: Not on file  . Transportation needs:    Medical: Not on file    Non-medical: Not on file  Tobacco Use  . Smoking status: Never Smoker  . Smokeless tobacco: Never Used  Substance and Sexual Activity  . Alcohol use: Yes    Comment: socially -1 per week  . Drug use: No  . Sexual activity: Yes    Birth control/protection: Condom, Pill  Lifestyle  . Physical activity:    Days per week: Not on file    Minutes per session: Not on file  . Stress: Not on file  Relationships  . Social connections:    Talks on phone: Not on file    Gets together: Not on file    Attends religious service: Not on file    Active member of club or organization: Not on file    Attends meetings of clubs or organizations: Not on file    Relationship status: Not on file  . Intimate partner violence:    Fear of current or ex partner: Not on file    Emotionally abused: Not on file    Physically abused: Not on file    Forced sexual activity: Not on file  Other Topics Concern  . Not on file  Social History Narrative  . Not on file    Review of Systems  Constitutional: Negative.   HENT: Negative.   Eyes: Negative.     Respiratory: Negative.  Cardiovascular: Negative.   Gastrointestinal: Negative.   Endocrine: Negative.   Genitourinary:       Pelvic cramping  Musculoskeletal: Negative.   Skin: Negative.   Allergic/Immunologic: Negative.   Neurological: Negative.   Hematological: Negative.   Psychiatric/Behavioral: Negative.     PHYSICAL EXAMINATION:    BP 120/76 (BP Location: Right Arm, Patient Position: Sitting, Cuff Size: Large)   Pulse 84   Resp 16   Ht 5\' 7"  (1.702 m)   Wt (!) 343 lb (155.6 kg)   LMP 06/04/2017 (Within Weeks)   BMI 53.72 kg/m     General appearance: alert, cooperative and appears stated age Head: Normocephalic, without obvious abnormality, atraumatic Lungs: clear to auscultation bilaterally Heart: regular rate and rhythm Abdomen: soft, non-tender, no masses,  no organomegaly Extremities: extremities normal, atraumatic, no cyanosis or edema  Neurologic: Grossly normal  Pelvic: External genitalia:  no lesions              Urethra:  normal appearing urethra with no masses, tenderness or lesions              Bartholins and Skenes: normal                 Vagina: normal appearing vagina with normal color and discharge, no lesions              Cervix: no lesions.  Blood present in the vagina.                Bimanual Exam:  Uterus:  normal size, contour, position, consistency, mobility, non-tender              Adnexa: no mass, fullness, tenderness         Chaperone was present for exam.  ASSESSMENT  Menorrhagia with irregular menses.    Ongoing bleeding with Provera challenge and then Micronor. Anemia.  Sickle cell trait.  Doing iron infusions.  Pituitary adenoma.  On Dostinex.  PLAN  We discussed her ongoing abnormal uterine bleeding and anemia. I am recommending hysteroscopy with possible Myosure resection of any endometrial masses/polyps, and dilation and curettage.  Risks, benefits, and alternatives reviewed. Risks include but are not limited to  bleeding, infection, damage to surrounding organs including uterine perforation requiring hospitalization and laparoscopy, pulmonary edema, reaction to anesthesia, need for further treatment including medical or surgical therapy. Surgical expectations and recovery discussed.  Patient wishes to proceed. [facility] HOs on hysteroscopy and dilation and curettage to patient.   An After Visit Summary was printed and given to the patient.  __25___ minutes face to face time of which over 50% was spent in counseling.      Instructions      Return for we will call you to schedule.   Patient declined After Visit Summary  Communications      [facility] Provider CC Chart Rep sent to [name], MD  Sent 06/11/2017  Media   Electronic signature on 06/09/2017 3:34 PM - Signed   Communication Routing History   Recipient Method Sent by Date Sent  [name], MD In [place], Brook E, MD 06/11/2017     Orders Placed      POCT urine pregnancy  Medication Changes        (Completed Course)    Medication List   Visit Diagnoses      Menorrhagia with irregular cycle    Pelvic cramping    Anemia, unspecified type    Problem List  Level of Service   Level of Service  PR OFFICE OUTPATIENT VISIT 25 MINUTES [99214]  Padilla Charges for This Encounter   Code  99214  Description: PR OFFICE OUTPATIENT VISIT 25 MINUTES  Service Date: 06/09/2017  Service Provider: Nunzio Cobbs, MD  Qty: 1  [phone]  Description: CHG URINE PREGNANCY TEST  Service Date: 06/09/2017  Service Provider: Nunzio Cobbs, MD  Qty: 1

## 2017-06-27 NOTE — Anesthesia Postprocedure Evaluation (Signed)
Anesthesia Post Note  Patient: Ana Padilla  Procedure(s) Performed: DILATATION & CURETTAGE/HYSTEROSCOPY (N/A )     Patient location during evaluation: PACU Anesthesia Type: General Level of consciousness: awake and alert Pain management: pain level controlled Vital Signs Assessment: post-procedure vital signs reviewed and stable Respiratory status: spontaneous breathing, nonlabored ventilation and respiratory function stable Cardiovascular status: blood pressure returned to baseline and stable Postop Assessment: no apparent nausea or vomiting Anesthetic complications: no    Last Vitals:  Vitals:   06/27/17 0845 06/27/17 0900  BP: 128/68 119/69  Pulse: 81 74  Resp: 18 18  Temp:    SpO2: 94% 94%    Last Pain:  Vitals:   06/27/17 0900  TempSrc:   PainSc: 0-No pain   Pain Goal: Patients Stated Pain Goal: 4 (06/27/17 0631)               Jamarrion Budai A.

## 2017-06-27 NOTE — Anesthesia Procedure Notes (Addendum)
Procedure Name: LMA Insertion Date/Time: 06/27/2017 7:30 AM Performed by: Josephine Igo, MD Pre-anesthesia Checklist: Patient identified, Suction available, Patient being monitored, Timeout performed and Emergency Drugs available Patient Re-evaluated:Patient Re-evaluated prior to induction Oxygen Delivery Method: Simple face mask Preoxygenation: Pre-oxygenation with 100% oxygen Induction Type: IV induction LMA: LMA inserted LMA Size: 5.0 Placement Confirmation: positive ETCO2 and breath sounds checked- equal and bilateral Tube secured with: Tape Dental Injury: Teeth and Oropharynx as per pre-operative assessment

## 2017-06-27 NOTE — Progress Notes (Signed)
Update to History and Physical  No marked change in status since office preop visit.  Bleeding still continues with Provera therapy.  Patient examined.  OK to proceed with surgery.

## 2017-06-27 NOTE — Progress Notes (Addendum)
Office Visit   06/21/2017 [place] Silva, Everardo All, MD  Obstetrics and Gynecology   Menorrhagia with irregular cycle  Dx   Office Visit   ; Referred by [name], MD  Reason for Visit   Additional Documentation   Vitals:   BP 120/80 (BP Location: Right Arm, Patient Position: Sitting, Cuff Size: Large)   Pulse 80   Resp 16   Ht 5\' 7"  (1.702 m)   Wt 344 lb (156 kg)    LMP 06/04/2017 (Within Weeks)   BMI 53.88 kg/m   BSA 2.72 m      More Vitals   Flowsheets:   MEWS Score,   Anthropometrics     Encounter Info:   Billing Info,   History,   Allergies,   Detailed Report     All Notes   Progress Notes by Nunzio Cobbs, MD at 06/21/2017 8:45 AM  Author: Nunzio Cobbs, MD Author Type: Physician Filed: 06/21/2017 9:48 AM  Note Status: Signed Cosign: Cosign Not Required Encounter Date: 06/21/2017  Editor: Nunzio Cobbs, MD (Physician)  Prior Versions: 1. Archie Balboa CMA (Certified Medical Assistant) at 06/21/2017 9:14 AM - Sign at close encounter    HISTORY AND PHYSICAL   HPI: 32 y.o.   Single  African American  female   [place] with Patient's last menstrual period was 06/04/2017 (within weeks).   here for irregular bleeding.  Patient was on Micronor and continues to have bleeding.  During this last weekend, started bleeding more.  Having cramping and bleeding through sheets.  Not feeling very well.   Feeling fatigue.  No shortness of breath or palpitations.  Some dizziness.   Called in yesterday, and I prescribed Provera 10 mg daily and she stopped the Micronor.  Today, the bleeding is continuing but the blood clots are not as large.  Pad change every 2 hours.   Has her presurgical appointment at hospital today at 10:30.   Scheduled for a hysteroscopy with dilation and curettage in 6 days.  GYNECOLOGIC HISTORY: Patient's last menstrual period was 06/04/2017 (within  weeks). Contraception:  condoms Menopausal hormone therapy:  n/a Last mammogram:  n/a Last pap smear:   04/28/17 Pap and HR HPV negative                OB History    Gravida  0   Para  0   Term  0   Preterm  0   AB  0   Living  0     SAB  0   TAB  0   Ectopic  0   Multiple  0   Live Births  0                  Patient Active Problem List   Diagnosis Date Noted  . Menorrhagia with irregular cycle 04/28/2017  . Cervical polyp 04/28/2017  . Morbid obesity ([place]) 04/28/2017  . Hyperprolactinemia ([place]) 02/11/2017  . Pituitary adenoma ([place]) 02/11/2017  . Sickle cell trait ([place]) 02/01/2017  . Iron deficiency anemia due to chronic blood loss 11/04/2016        Past Medical History:  Diagnosis Date  . Adenoma of pituitary ([place]) 08/2014  . Anemia   . IBS (irritable bowel syndrome)   . Sickle cell trait [facility])          Past Surgical History:  Procedure Laterality Date  .  BLEPHAROPLASTY Right    lower lid x 3  . CHOLECYSTECTOMY  2016          Current Outpatient Medications  Medication Sig Dispense Refill  . budesonide (RHINOCORT ALLERGY) 32 MCG/ACT nasal spray Place 1 spray into both nostrils daily.    . cabergoline (DOSTINEX) 0.5 MG tablet Take 0.5 tablets (0.25 mg total) by mouth once a week. (Patient taking differently: Take 0.25 mg by mouth 2 (two) times a week. ) 10 tablet 2  . cetirizine (ZYRTEC) 10 MG tablet Take 10 mg by mouth daily.     . cholecalciferol (VITAMIN D) 1000 units tablet Take 1,000 Units by mouth daily.     . colestipol (COLESTID) 1 g tablet Take 2 tablets (2 g total) by mouth 2 (two) times daily. (Patient taking differently: Take 1 g by mouth daily. ) 120 tablet 2  . ferrous sulfate 325 (65 FE) MG tablet Take 325 mg by mouth 2 (two) times a week.    . medroxyPROGESTERone (PROVERA) 10 MG tablet Take 1 tablet (10 mg total) by mouth daily for 8 days. 8 tablet 0  . Tetrahydrozoline HCl (VISINE OP) Place 1 drop  into both eyes daily.     No current facility-administered medications for this visit.      ALLERGIES: Food       Family History  Adopted: Yes  Problem Relation Age of Onset  . Sickle cell trait Mother   . Heart disease Father     Social History        Socioeconomic History  . Marital status: Single    Spouse name: Not on file  . Number of children: 0  . Years of education: Not on file  . Highest education level: Not on file  Occupational History  . Occupation: Engineer, maintenance (IT)  Social Needs  . Financial resource strain: Not on file  . Food insecurity:    Worry: Not on file    Inability: Not on file  . Transportation needs:    Medical: Not on file    Non-medical: Not on file  Tobacco Use  . Smoking status: Never Smoker  . Smokeless tobacco: Never Used  Substance and Sexual Activity  . Alcohol use: Yes    Comment: socially -1 per week  . Drug use: No  . Sexual activity: Yes    Birth control/protection: Condom, Pill  Lifestyle  . Physical activity:    Days per week: Not on file    Minutes per session: Not on file  . Stress: Not on file  Relationships  . Social connections:    Talks on phone: Not on file    Gets together: Not on file    Attends religious service: Not on file    Active member of club or organization: Not on file    Attends meetings of clubs or organizations: Not on file    Relationship status: Not on file  . Intimate partner violence:    Fear of current or ex partner: Not on file    Emotionally abused: Not on file    Physically abused: Not on file    Forced sexual activity: Not on file  Other Topics Concern  . Not on file  Social History Narrative  . Not on file    Review of Systems  Constitutional: Negative.   HENT: Negative.   Eyes: Negative.   Respiratory: Negative.   Cardiovascular: Negative.   Gastrointestinal: Negative.   Endocrine: Negative.   Genitourinary: Positive  for vaginal bleeding.  Musculoskeletal: Negative.   Skin: Negative.   Allergic/Immunologic: Negative.   Neurological: Negative.   Hematological: Negative.   Psychiatric/Behavioral: Negative.     PHYSICAL EXAMINATION:    BP 120/80 (BP Location: Right Arm, Patient Position: Sitting, Cuff Size: Large)   Pulse 80   Resp 16   Ht 5\' 7"  (1.702 m)   Wt (!) 344 lb (156 kg)   LMP 06/04/2017 (Within Weeks)   BMI 53.88 kg/m     General appearance: alert, cooperative and appears stated age   Pelvic: External genitalia:  no lesions              Urethra:  normal appearing urethra with no masses, tenderness or lesions              Bartholins and Skenes: normal                 Vagina: normal appearing vagina with normal color and discharge, no lesions              Cervix: no lesions.  Clot at internal os.                 Bimanual Exam:  Uterus:  normal size, contour, position, consistency, mobility, non-tender              Adnexa: no mass, fullness, tenderness               Chaperone was present for exam.  ASSESSMENT  Menorrhagia with irregular menses.   PLAN  Check Hgb now - 9.4. Proceed with preop at hospital.  Will increase to Provera 20 mg daily.  Call if bleeding does not improve or if develops worsening symptoms.  Iron bid.   An After Visit Summary was printed and given to the patient.  ___15___ minutes face to face time of which over 50% was spent in counseling.      Instructions    Patient declined After Visit Summary  Communications      [facility] Provider CC Chart Rep sent to [name], MD  Media   Electronic signature on 06/21/2017 8:54 AM - Signed   Communication Routing History   Recipient Method Sent by Date Sent  [name], MD In [facility],

## 2017-06-27 NOTE — Op Note (Signed)
OPERATIVE REPORT   PREOPERATIVE DIAGNOSES:   Menorrhagia with irregular menses, thickened endometrium  POSTOPERATIVE DIAGNOSES:   Menorrhagia with irregular menses, thickened endometrium  PROCEDURE:  Hysteroscopy with dilation and curettage.  SURGEON:  Lenard Galloway, MD  ANESTHESIA:  LMA, paracervical block with 10 mL of 1% lidocaine.  IV FLUIDS:   800 cc LR.  EBL:  15 cc.   URINE OUTPUT:   50 cc.  NORMAL SALINE DEFICIT:   100 cc.  COMPLICATIONS:  None.  INDICATIONS FOR THE PROCEDURE:     A plan is now made to proceed with a hysteroscopy with dilation and curettage  and possible Myosure resection of endometrium after risks, benefits and  alternatives were reviewed.  FINDINGS:  Exam under anesthesia revealed a small anteverted uterus.    No adnexal masses were noted. The uterus was sounded to 8.5 cm. Hysteroscopy showed clot and stranding in the endometrial cavity.  Endometrial currettings were moderate in quantity.   SPECIMENS:  Endometrial curettings were sent to Pathology.  PROCEDURE IN DETAIL:  The patient was reidentified in the preoperative hold area.  She  received TED hose and PAS stockings for DVT prophylaxis.  In the operating room, the patient was placed in the dorsal lithotomy position and then an LMA anesthetic was introduced.  The patient's lower abdomen, vagina and perineum were sterilely prepped with Betadine and the  patient's bladder was catheterized of urine.  She was sterilly draped  An exam under anesthesia was performed.  A speculum was placed inside the vagina and a single-tooth tenaculum was placed on the anterior cervical lip.  A paracervical block was performed with a total of 10 mL of 1% lidocaine plain.  The uterus was sounded. The cervix was dilated to a #21 Pratt dilator.  The hysteroscope was then inserted inside the uterine cavity under the continuous infusion of normal saline solution.  Findings are as noted above.  The   hysteroscope was removed.  The cervix was further dilated to a #23 Pratt dilator.  The serrated and then sharp curettes were introduced into the uterine cavity and the endometrium was curetted in all 4 quadrants.  A moderate amount of endometrial curettings was obtained.  This specimen was sent to Pathology.  The single-tooth tenaculum which had been placed on the anterior cervical lip was removed.  Hemostasis was good, and all of the vaginal  instruments were removed.  The patient was awakened and escorted to the recovery room in stable condition after she was cleansed with Betadine.  There were no complications to the procedure.  All needle, instrument and sponge counts were correct.  Lenard Galloway, MD

## 2017-06-28 ENCOUNTER — Encounter (HOSPITAL_COMMUNITY): Payer: Self-pay | Admitting: Obstetrics and Gynecology

## 2017-07-01 ENCOUNTER — Encounter: Payer: Self-pay | Admitting: Family Medicine

## 2017-07-01 ENCOUNTER — Ambulatory Visit: Payer: 59 | Admitting: Family Medicine

## 2017-07-01 VITALS — BP 110/74 | HR 78 | Temp 98.8°F | Wt 344.0 lb

## 2017-07-01 DIAGNOSIS — E559 Vitamin D deficiency, unspecified: Secondary | ICD-10-CM

## 2017-07-01 DIAGNOSIS — Z0000 Encounter for general adult medical examination without abnormal findings: Secondary | ICD-10-CM

## 2017-07-01 DIAGNOSIS — D500 Iron deficiency anemia secondary to blood loss (chronic): Secondary | ICD-10-CM

## 2017-07-01 DIAGNOSIS — R5383 Other fatigue: Secondary | ICD-10-CM

## 2017-07-01 DIAGNOSIS — Z13220 Encounter for screening for lipoid disorders: Secondary | ICD-10-CM

## 2017-07-01 DIAGNOSIS — Z23 Encounter for immunization: Secondary | ICD-10-CM

## 2017-07-01 NOTE — Progress Notes (Signed)
Subjective:     Ana Padilla is a 32 y.o. female and is here for a comprehensive physical exam. The patient reports problems - AUB and weight concerns.  AUB: followed by OB/Gyn.  Has tried various methods of birth control, provera, micronor, IUD.  S/p d&C on 6/24, still having some bleeding, but hopes it will improve.  Pt has been anemic 2/2 chronic blood loss.  Taking ferrous sulfate 325 bid. Has f/u with OB/Gyn next wk.  Pt is interested in weight loss.  States once feeling better would like to do more such as go to the gym.    Social History   Socioeconomic History  . Marital status: Single    Spouse name: Not on file  . Number of children: 0  . Years of education: Not on file  . Highest education level: Not on file  Occupational History  . Occupation: Engineer, maintenance ([employer])  Social Needs  . Financial resource strain: Not on file  . Food insecurity:    Worry: Not on file    Inability: Not on file  . Transportation needs:    Medical: Not on file    Non-medical: Not on file  Tobacco Use  . Smoking status: Never Smoker  . Smokeless tobacco: Never Used  Substance and Sexual Activity  . Alcohol use: Yes    Comment: socially -1 per week  . Drug use: No  . Sexual activity: Yes    Birth control/protection: Condom  Lifestyle  . Physical activity:    Days per week: Not on file    Minutes per session: Not on file  . Stress: Not on file  Relationships  . Social connections:    Talks on phone: Not on file    Gets together: Not on file    Attends religious service: Not on file    Active member of club or organization: Not on file    Attends meetings of clubs or organizations: Not on file    Relationship status: Not on file  . Intimate partner violence:    Fear of current or ex partner: Not on file    Emotionally abused: Not on file    Physically abused: Not on file    Forced sexual activity: Not on file  Other Topics Concern  . Not on file  Social History Narrative  .  Not on file   Health Maintenance  Topic Date Due  . TETANUS/TDAP  04/29/2006  . INFLUENZA VACCINE  08/04/2017  . PAP SMEAR  04/28/2020  . HIV Screening  Completed    The following portions of the patient's history were reviewed and updated as appropriate: allergies, current medications, past family history, past medical history, past social history, past surgical history and problem list.  Review of Systems A comprehensive review of systems was negative.   Objective:    BP 110/74 (BP Location: Right Wrist, Patient Position: Sitting, Cuff Size: Normal)   Pulse 78   Temp 98.8 F (37.1 C) (Oral)   Wt (!) 344 lb (156 kg)   LMP 06/04/2017 (Exact Date)   SpO2 98%   BMI 53.88 kg/m  General appearance: alert, cooperative, appears stated age and no distress Head: Normocephalic, without obvious abnormality, atraumatic Eyes: conjunctivae/corneas clear. PERRL, EOM's intact. Fundi benign. Ears: normal TM's and external ear canals both ears Nose: Nares normal. Septum midline. Mucosa normal. No drainage or sinus tenderness. Throat: lips, mucosa, and tongue normal; teeth and gums normal Neck: no adenopathy, no JVD, supple, symmetrical, trachea  midline and thyroid not enlarged, symmetric, no tenderness/mass/nodules Lungs: clear to auscultation bilaterally Heart: regular rate and rhythm, S1, S2 normal, no murmur, click, rub or gallop Abdomen: soft, non-tender; bowel sounds normal; no masses,  no organomegaly Extremities: extremities normal, atraumatic, no cyanosis or edema Skin: Skin color, texture, turgor normal. No rashes or lesions Neurologic: Alert and oriented X 3, normal strength and tone. Normal symmetric reflexes. Normal coordination and gait    Assessment:    Healthy female exam.      Plan:     Anticipatory guidance given including wearing seatbelts, smoke detectors in the home, increasing physical activity, increasing p.o. intake of water and vegetables. -labs ordered.  Pt  would like to have them drawn at her job--the [facility] -next CPE in 1 yr. -pt to make more of an effort to lose weight once feeling better/ AUB has stopped. See After Visit Summary for Counseling Recommendations    Vit d def -will recheck vit D.  Iron Def. Anemia -should improve once AUB controlled. -cbc -continue ferrous sulfate 325 mg BID -ok to use Miralax prn for constipation  Fatigue -likely due to anemia of chronic dz and/or vit D deficiency. -vit d, vit b12, cbc  Grier Mitts, MD

## 2017-07-04 ENCOUNTER — Encounter: Payer: Self-pay | Admitting: Obstetrics and Gynecology

## 2017-07-04 ENCOUNTER — Ambulatory Visit (INDEPENDENT_AMBULATORY_CARE_PROVIDER_SITE_OTHER): Payer: 59 | Admitting: Obstetrics and Gynecology

## 2017-07-04 ENCOUNTER — Telehealth: Payer: Self-pay | Admitting: Obstetrics and Gynecology

## 2017-07-04 ENCOUNTER — Other Ambulatory Visit: Payer: Self-pay

## 2017-07-04 ENCOUNTER — Encounter: Payer: Self-pay | Admitting: Family Medicine

## 2017-07-04 VITALS — BP 136/80 | HR 94 | Resp 16 | Ht 67.0 in | Wt 346.0 lb

## 2017-07-04 DIAGNOSIS — N939 Abnormal uterine and vaginal bleeding, unspecified: Secondary | ICD-10-CM | POA: Diagnosis not present

## 2017-07-04 DIAGNOSIS — Z9889 Other specified postprocedural states: Secondary | ICD-10-CM

## 2017-07-04 MED ORDER — NORETHINDRONE ACETATE 5 MG PO TABS: ORAL_TABLET | ORAL | 2 refills | Status: DC

## 2017-07-04 NOTE — Telephone Encounter (Signed)
Patient returned call and scheduled an appointment for this morning with Dr. Quincy Simmonds at 9:15 AM. Routing to triage for FYI only.

## 2017-07-04 NOTE — Telephone Encounter (Signed)
Routing to Dr. Quincy Simmonds. Encounter closed.

## 2017-07-04 NOTE — Progress Notes (Signed)
GYNECOLOGY  VISIT   HPI: 32 y.o.   Single  African American  female   Ana Padilla with Patient's last menstrual period was 06/04/2017 (exact date).   here for   Post op bleeding.  Final pathology showed benign polyp.   Bleeding have been steady post op but then increased 2 days ago.  Woke up this am with cramping and blood clots.  Last Provera dose was 8 days ago.  Was taking 20 mg daily.  Considering childbearing maybe in one year.  Has used Mirena, OCPs, Evra, Provera for contraception.  Seeing dietician for care and trying to take care of herself.  Hgb 9.4.  GYNECOLOGIC HISTORY: Patient's last menstrual period was 06/04/2017 (exact date). Contraception:  condoms Menopausal hormone therapy:  n/a Last mammogram:  n/a Last pap smear:   04-28-17 negative, HR HPV negative         OB History    Gravida  0   Para  0   Term  0   Preterm  0   AB  0   Living  0     SAB  0   TAB  0   Ectopic  0   Multiple  0   Live Births  0              Patient Active Problem List   Diagnosis Date Noted  . Menorrhagia with irregular cycle 04/28/2017  . Cervical polyp 04/28/2017  . Morbid obesity ([place]) 04/28/2017  . Hyperprolactinemia ([place]) 02/11/2017  . Pituitary adenoma ([place]) 02/11/2017  . Sickle cell trait ([place]) 02/01/2017  . Iron deficiency anemia due to chronic blood loss 11/04/2016    Past Medical History:  Diagnosis Date  . Adenoma of pituitary ([place]) 08/2014  . Anemia   . IBS (irritable bowel syndrome)   . Seasonal allergies   . Sickle cell trait [facility])     Past Surgical History:  Procedure Laterality Date  . BLEPHAROPLASTY Right    lower lid x 3  . CHOLECYSTECTOMY  2016  . HYSTEROSCOPY W/D&C N/A 06/27/2017   Procedure: DILATATION & CURETTAGE/HYSTEROSCOPY;  Surgeon: Nunzio Cobbs, MD;  Location: [facility];  Service: Gynecology;  Laterality: N/A;  . iud placement     been removed    Current Outpatient Medications  Medication Sig Dispense Refill   . acetaminophen (TYLENOL) 500 MG tablet Take 1 tablet (500 mg total) by mouth every 6 (six) hours as needed for mild pain or moderate pain. 30 tablet 0  . budesonide (RHINOCORT ALLERGY) 32 MCG/ACT nasal spray Place 1 spray into both nostrils daily.    . cabergoline (DOSTINEX) 0.5 MG tablet Take 0.5 tablets (0.25 mg total) by mouth once a week. (Patient taking differently: Take 0.25 mg by mouth 2 (two) times a week. ) 10 tablet 2  . cetirizine (ZYRTEC) 10 MG tablet Take 10 mg by mouth daily.     . cholecalciferol (VITAMIN D) 1000 units tablet Take 1,000 Units by mouth daily.     . colestipol (COLESTID) 1 g tablet Take 2 tablets (2 g total) by mouth 2 (two) times daily. (Patient taking differently: Take 1 g by mouth daily. ) 120 tablet 2  . ferrous sulfate 325 (65 FE) MG tablet Take 325 mg by mouth 2 (two) times a week.    Marland Kitchen ibuprofen (ADVIL,MOTRIN) 800 MG tablet Take 1 tablet (800 mg total) by mouth every 8 (eight) hours as needed. 30 tablet 0  . Tetrahydrozoline HCl (VISINE OP) Place  1 drop into both eyes daily.     No current facility-administered medications for this visit.      ALLERGIES: Food and Pollinex-t [modified tree tyrosine adsorbate]  Family History  Adopted: Yes  Problem Relation Age of Onset  . Sickle cell trait Mother   . Heart disease Father     Social History   Socioeconomic History  . Marital status: Single    Spouse name: Not on file  . Number of children: 0  . Years of education: Not on file  . Highest education level: Not on file  Occupational History  . Occupation: Engineer, maintenance (IT)  Social Needs  . Financial resource strain: Not on file  . Food insecurity:    Worry: Not on file    Inability: Not on file  . Transportation needs:    Medical: Not on file    Non-medical: Not on file  Tobacco Use  . Smoking status: Never Smoker  . Smokeless tobacco: Never Used  Substance and Sexual Activity  . Alcohol use: Yes    Comment: socially -1 per week  .  Drug use: No  . Sexual activity: Yes    Birth control/protection: Condom  Lifestyle  . Physical activity:    Days per week: Not on file    Minutes per session: Not on file  . Stress: Not on file  Relationships  . Social connections:    Talks on phone: Not on file    Gets together: Not on file    Attends religious service: Not on file    Active member of club or organization: Not on file    Attends meetings of clubs or organizations: Not on file    Relationship status: Not on file  . Intimate partner violence:    Fear of current or ex partner: Not on file    Emotionally abused: Not on file    Physically abused: Not on file    Forced sexual activity: Not on file  Other Topics Concern  . Not on file  Social History Narrative  . Not on file    Review of Systems  Constitutional: Negative.   HENT: Negative.   Eyes: Negative.   Respiratory: Negative.   Cardiovascular: Negative.   Gastrointestinal: Negative.   Endocrine: Negative.   Genitourinary: Positive for vaginal bleeding.  Musculoskeletal: Negative.   Skin: Negative.   Allergic/Immunologic: Negative.   Neurological: Negative.   Hematological: Negative.   Psychiatric/Behavioral: Negative.     PHYSICAL EXAMINATION:    BP 136/80 (BP Location: Right Arm, Patient Position: Sitting, Cuff Size: Large)   Pulse 94   Resp 16   Ht 5\' 7"  (1.702 m)   Wt (!) 346 lb (156.9 kg)   LMP 06/04/2017 (Exact Date)   BMI 54.19 kg/m     General appearance: alert, cooperative and appears stated age  Pelvic: External genitalia:  no lesions              Urethra:  normal appearing urethra with no masses, tenderness or lesions              Bartholins and Skenes: normal                 Vagina: normal appearing vagina with normal color and discharge, no lesions              Cervix: no lesions.  Small dark clot at os.  No active bleeding.  Bimanual Exam:  Uterus:  normal size, contour, position, consistency, mobility,  non-tender              Adnexa: no mass, fullness, tenderness                Chaperone was present for exam.  ASSESSMENT  Status post dilation and curettage.  Benign polyp on pathology.  Withdrawal bleeding after stopping Provera.  Hyperprolactinemia.  On Dostinex.   PLAN  Surgical findings, procedure, and path report reviewed.  Start Aygestin 5 mg po bid.  Condoms for contraception.  FU in 2 months.    An After Visit Summary was printed and given to the patient.

## 2017-07-04 NOTE — Patient Instructions (Signed)
Take on Aygestin by mouth twice a day continually.

## 2017-07-04 NOTE — Telephone Encounter (Signed)
Patient is still having bleeding and passing clots after her D &C last week. Please call ASAP.

## 2017-07-04 NOTE — Telephone Encounter (Signed)
Spoke with patient. S/p D&C on 06/27/17. Patient reports steady flow since having D&C, woke up to heavier bleeding and "palm size" clots this morning. Has not changed pad in 2 hrs, currently at work. Denies pain, weakness, SOB, lightheadedness, weakness. Taking 800 mg motrin prn.   Advised patient OV recommended today for further evaluation. Offered OV at 9:15am and 10:45am with Dr. Quincy Simmonds. Patient states she is at work, needs to review with employer and return call. Patient verbalizes understanding.

## 2017-07-05 LAB — PROLACTIN: Prolactin: 8.3 ng/mL (ref 4.8–23.3)

## 2017-07-06 ENCOUNTER — Other Ambulatory Visit: Payer: 59

## 2017-07-11 ENCOUNTER — Ambulatory Visit: Payer: 59 | Admitting: Obstetrics and Gynecology

## 2017-07-14 ENCOUNTER — Ambulatory Visit: Payer: 59 | Admitting: Dietician

## 2017-07-19 ENCOUNTER — Inpatient Hospital Stay: Payer: 59 | Attending: Hematology

## 2017-07-19 DIAGNOSIS — N920 Excessive and frequent menstruation with regular cycle: Secondary | ICD-10-CM | POA: Insufficient documentation

## 2017-07-19 DIAGNOSIS — D500 Iron deficiency anemia secondary to blood loss (chronic): Secondary | ICD-10-CM | POA: Diagnosis not present

## 2017-07-19 LAB — CBC WITH DIFFERENTIAL (CANCER CENTER ONLY)
Basophils Absolute: 0 10*3/uL (ref 0.0–0.1)
Basophils Relative: 0 %
Eosinophils Absolute: 0.2 10*3/uL (ref 0.0–0.5)
Eosinophils Relative: 2 %
HCT: 33.3 % — ABNORMAL LOW (ref 34.8–46.6)
Hemoglobin: 10.2 g/dL — ABNORMAL LOW (ref 11.6–15.9)
Lymphocytes Relative: 19 %
Lymphs Abs: 1.5 10*3/uL (ref 0.9–3.3)
MCH: 22.3 pg — ABNORMAL LOW (ref 25.1–34.0)
MCHC: 30.6 g/dL — ABNORMAL LOW (ref 31.5–36.0)
MCV: 72.7 fL — ABNORMAL LOW (ref 79.5–101.0)
Monocytes Absolute: 0.3 10*3/uL (ref 0.1–0.9)
Monocytes Relative: 3 %
Neutro Abs: 5.9 10*3/uL (ref 1.5–6.5)
Neutrophils Relative %: 76 %
Platelet Count: 430 10*3/uL — ABNORMAL HIGH (ref 145–400)
RBC: 4.58 MIL/uL (ref 3.70–5.45)
RDW: 17.4 % — ABNORMAL HIGH (ref 11.2–14.5)
WBC Count: 7.8 10*3/uL (ref 3.9–10.3)

## 2017-07-19 LAB — IRON AND TIBC
Iron: 32 ug/dL — ABNORMAL LOW (ref 41–142)
Saturation Ratios: 10 % — ABNORMAL LOW (ref 21–57)
TIBC: 334 ug/dL (ref 236–444)
UIBC: 302 ug/dL

## 2017-07-19 LAB — RETICULOCYTES
RBC.: 4.58 MIL/uL (ref 3.70–5.45)
Retic Count, Absolute: 100.8 10*3/uL — ABNORMAL HIGH (ref 33.7–90.7)
Retic Ct Pct: 2.2 % — ABNORMAL HIGH (ref 0.7–2.1)

## 2017-07-19 LAB — FERRITIN: Ferritin: 131 ng/mL (ref 11–307)

## 2017-07-21 ENCOUNTER — Telehealth: Payer: Self-pay | Admitting: Obstetrics and Gynecology

## 2017-07-21 NOTE — Progress Notes (Incomplete)
[place]  Telephone:([phone]) [phone] Fax:([phone]) [phone]  Clinic follow Up Note   Patient Care Team: [name], MD as PCP - General (Family Medicine)   Date of Service:  07/21/2017  CHIEF COMPLAINTS:  Follow up Iron deficiency anemia   HISTORY OF PRESENTING ILLNESS:  Ana Padilla 32 y.o. 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 She recently moved to [facility] from [place] in April because of her boyfriend. She was followed for several medical conditions including anemia and h/o pituitary adenoma in that area. Overall, in her daily activities she does feel somewhat restricted due to her fatigue, but there are no other prohibitive symptoms. Of note, she is adopted and unsure of her father's medical history. She does note that her mother's side of the family does have leukemia, but otherwise she is unsure of their history as well. She does drink alcohol socially as well.  She recently established care with Dr Volanda Napoleon, Langley Adie, MD for her primary care. She had basic labs drawn which showed a Hgb level of 9.9, HCT of 31.8, MCV of 62.7, and increased platelets at 445.0K. Prolactin was also notable to be elevated at 54.6, endocrinology referral was placed. Additionally, Vitamin D level was low at 20.41 as well. She was started on Ergocalciferol 50000 IU every week for 8 weeks. She will begin OTC Vitamin D 1K-2K IU daily. She was subsequently referred into Hematology following this.   She states has been diagnosed with anemia for approximately 12 years now. She reports that her counts typically drop around the time of her menses, during which time she will become noticeably fatigued. Her menses have been irregular at baseline.  Her menses usually occur every 3-4 months and they are very heavy at baseline. She typically has to change her tampon every 30 minutes and does have the passage of clots as well. The longest period has been 13 days and  she reports that it will be consistently heavy until the last two days of her menses before it tapers off.   Additionally, she was noted to have a 40lbs weight gain at the beginning of the year which was rapid and unexpected. This was thought to be due to Cushing's syndrome, however, she was tested for this and it was negative.   Her lowest Hgb level in the past to her knowledge was 8.0, but she has never had to previously taken any blood transfusions. She has previously had one infusion of IV Iron and she notes that following this she began a menses and this was still very heavy. She is currently taking Vitamin D and iron supplement with OJ in the mornings, but she remained fatigue throughout the day. She also is currently taking Cabergoline for her elevated Prolactin and she has not noticed any changes in her fatigue related to this.   On review of systems, pt denies fever, chills, rash, mouth sores, weight loss, decreased appetite, urinary complaints. Denies pain. Pt denies abdominal pain, nausea, vomiting.   CURRENT THERAPY: oral ferrous sulfate, IV Feraheme as needed (11/09/16, 11/26/16, 04/01/17, 04/15/17, 06/06/17, 06/27/17)  INTERIM HISTORY:  *** [name] returns for follow-up for her ion deficient anemia. Since her last visit she received IV Feraheme 3 times. She underwent a D& C on 6/24/9 for her menorrhagia  She presents to the clinic today      On review of symptoms, pt      {noting her heavy  period bleeding is still on going. She has been changing her pad every 1.5 hours. She will be seen by [facility] next month to work on stopping her menorrhagia. She notes she felt better after her IV iron. She notes she is worried with surgery including her option of bariatric surgery. She notes she is in a weight loss program currently. She takes Vitamin D 2000-3000 units and oral iron daily to help with her    On review of symptoms, pt denies any other issues with bleeding or bruising.  She still having ongoing menorrhagia. This contributes to her fatigue, chest pain and headaches. }     MEDICAL HISTORY:  Past Medical History:  Diagnosis Date   Adenoma of pituitary ([place]) 08/2014   Anemia    IBS (irritable bowel syndrome)    Seasonal allergies    Sickle cell trait ([place])     SURGICAL HISTORY: Past Surgical History:  Procedure Laterality Date   BLEPHAROPLASTY Right    lower lid x 3   CHOLECYSTECTOMY  2016   HYSTEROSCOPY W/D&C N/A 06/27/2017   Procedure: DILATATION & CURETTAGE/HYSTEROSCOPY;  Surgeon: Nunzio Cobbs, MD;  Location: [facility];  Service: Gynecology;  Laterality: N/A;   iud placement     been removed    SOCIAL HISTORY: Social History   Socioeconomic History   Marital status: Single    Spouse name: Not on file   Number of children: 0   Years of education: Not on file   Highest education level: Not on file  Occupational History   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  Social Needs   Financial resource strain: Not on file   Food insecurity:    Worry: Not on file    Inability: Not on file   Transportation needs:    Medical: Not on file    Non-medical: Not on file  Tobacco Use   Smoking status: Never Smoker   Smokeless tobacco: Never Used  Substance and Sexual Activity   Alcohol use: Yes    Comment: socially -1 per week   Drug use: No   Sexual activity: Yes    Birth control/protection: Condom  Lifestyle   Physical activity:    Days per week: Not on file    Minutes per session: Not on file   Stress: Not on file  Relationships   Social connections:    Talks on phone: Not on file    Gets together: Not on file    Attends religious service: Not on file    Active member of club or organization: Not on file    Attends meetings of clubs or organizations: Not on file    Relationship status: Not on file   Intimate partner violence:    Fear of current or ex partner: Not on file    Emotionally abused: Not  on file    Physically abused: Not on file    Forced sexual activity: Not on file  Other Topics Concern   Not on file  Social History Narrative   Not on file    FAMILY HISTORY: Family History  Adopted: Yes  Problem Relation Age of Onset   Sickle cell trait Mother    Heart disease Father     ALLERGIES:  is allergic to food and pollinex-t [modified tree tyrosine adsorbate].  MEDICATIONS:  Current Outpatient Medications  Medication Sig Dispense Refill   acetaminophen (TYLENOL) 500 MG tablet Take 1 tablet (500 mg total) by mouth every 6 (six) hours as  needed for mild pain or moderate pain. 30 tablet 0   budesonide (RHINOCORT ALLERGY) 32 MCG/ACT nasal spray Place 1 spray into both nostrils daily.     cabergoline (DOSTINEX) 0.5 MG tablet Take 0.5 tablets (0.25 mg total) by mouth once a week. (Patient taking differently: Take 0.25 mg by mouth 2 (two) times a week. ) 10 tablet 2   cetirizine (ZYRTEC) 10 MG tablet Take 10 mg by mouth daily.      cholecalciferol (VITAMIN D) 1000 units tablet Take 1,000 Units by mouth daily.      colestipol (COLESTID) 1 g tablet Take 2 tablets (2 g total) by mouth 2 (two) times daily. (Patient taking differently: Take 1 g by mouth daily. ) 120 tablet 2   ferrous sulfate 325 (65 FE) MG tablet Take 325 mg by mouth 2 (two) times a week.     ibuprofen (ADVIL,MOTRIN) 800 MG tablet Take 1 tablet (800 mg total) by mouth every 8 (eight) hours as needed. 30 tablet 0   norethindrone (AYGESTIN) 5 MG tablet Take one tablet, 5 mg, by mouth twice daily. 60 tablet 2   Tetrahydrozoline HCl (VISINE OP) Place 1 drop into both eyes daily.     No current facility-administered medications for this visit.     REVIEW OF SYSTEMS:  *** Constitutional: Denies fevers, chills or abnormal night sweats (+) significant fatigue (+) occasional headaches Eyes: Denies blurriness of vision, double vision or watery eyes Ears, nose, mouth, throat, and face: Denies mucositis or  sore throat Respiratory: Denies cough, dyspnea or wheezes  Cardiovascular: Denies palpitation or lower extremity swelling (+) occasional chest discomfort  Gastrointestinal:  Denies nausea, heartburn or change in bowel habits Skin: Denies abnormal skin rashes Lymphatics: Denies new lymphadenopathy or easy bruising Neurological:Denies numbness, tingling or new weaknesses Behavioral/Psych: Mood is stable, no new changes  All other systems were reviewed with the patient and are negative.  PHYSICAL EXAMINATION: ECOG PERFORMANCE STATUS: 1 - Symptomatic but completely ambulatory  There were no vitals filed for this visit. There were no vitals filed for this visit. *** GENERAL:alert, no distress and comfortable SKIN: skin color, texture, turgor are normal, no rashes or significant lesions EYES: normal, conjunctiva are pink and non-injected, sclera clear OROPHARYNX:no exudate, no erythema and lips, buccal mucosa, and tongue normal  NECK: supple, thyroid normal size, non-tender, without nodularity LYMPH:  no palpable lymphadenopathy in the cervical, axillary or inguinal LUNGS: clear to auscultation and percussion with normal breathing effort HEART: regular rate & rhythm and no murmurs and no lower extremity edema ABDOMEN:abdomen soft, non-tender and normal bowel sounds Musculoskeletal:no cyanosis of digits and no clubbing  PSYCH: alert & oriented x 3 with fluent speech NEURO: no focal motor/sensory deficits  LABORATORY DATA:  I have reviewed the data as listed CBC Latest Ref Rng & Units 07/19/2017 06/22/2017 05/31/2017  WBC 3.9 - 10.3 K/uL 7.8 9.1 8.5  Hemoglobin 11.6 - 15.9 g/dL 10.2(L) 9.9(L) 10.2(L)  Hematocrit 34.8 - 46.6 % 33.3(L) 31.9(L) 33.1(L)  Platelets 145 - 400 K/uL 430(H) 481(H) 411(H)    CMP Latest Ref Rng & Units 06/22/2017 09/20/2016  Glucose 65 - 99 mg/dL 101(H) 96  BUN 6 - 20 mg/dL 9 8  Creatinine 0.44 - 1.00 mg/dL 0.69 0.72  Sodium 135 - 145 mmol/L 139 138  Potassium 3.5  - 5.1 mmol/L 3.8 3.9  Chloride 101 - 111 mmol/L 104 100  CO2 22 - 32 mmol/L 25 29  Calcium 8.9 - 10.3 mg/dL 9.2 9.3  Total  Protein 6.0 - 8.3 g/dL - 7.3  Total Bilirubin 0.2 - 1.2 mg/dL - 0.4  Alkaline Phos 39 - 117 U/L - 65  AST 0 - 37 U/L - 15  ALT 0 - 35 U/L - 12   *** Results for CALA, KRUCKENBERG (MRN [id number]) as of 07/21/2017 16:57  Ref. Range 03/17/2017 08:43 04/13/2017 08:14 05/31/2017 08:07 07/19/2017 08:24  Iron Latest Ref Range: 41 - 142 ug/dL 48 43 36 (L) 32 (L)  UIBC Latest Units: ug/dL 261 227 284 302  TIBC Latest Ref Range: 236 - 444 ug/dL 309 270 320 334  Saturation Ratios Latest Ref Range: 21 - 57 % 16 (L) 16 (L) 11 (L) 10 (L)  Ferritin Latest Ref Range: 11 - 307 ng/mL 95 248 149 131     RADIOGRAPHIC STUDIES: I have personally reviewed the radiological images as listed and agreed with the findings in the report. No results found.  ASSESSMENT No problem-specific Assessment & Plan notes found for this encounter.  This is a lovely 32 y.o. AA female who presents with anemia since teenager  1. Iron deficiency anemia, secondary to menorrhagia, sickle cell trait  -I have previously reviewed the patient's labs in great detail with her previously.  -Her CBC in September 2018 showed microcytic anemia with MCV 62.7, moderate anemia with hemoglobin 9.9, normal WBC, elevated platelet count at 445K. She had a history of iron deficient anemia, likely secondary to her menorrhagia. This is consistent with iron deficient anemia, which was confirmed on her lab work -She responded to initial IV iron, her hemoglobin improved but her fatigue did not improve. -Giving the very low MCV, anemia did not resolve after adequate iron replacement, I did hemoglobin electrophoresis to rule out thalassemia.  The test confirmed she has a sickle cell trait (Hgb S 31%), she also has slightly elevated Hgb A2 (4.4%) which could be related her sickle cell trait. No evidence of beta thalassemia.  -She has been  taking oral iron ferrous sulfate once daily, tolerating well, however still has mild to moderate anemia, and significant fatigue. -Pt notes she tried various forms of birth control in the past which did not help to control her menorrhagia. She underwent D&C on 06/27/17 to help manage her menorrhagia and a benign polyp was removed.  -Her endocrine work up with Dr. [name] and GI workup with Dr. Henrene Pastor was normal other than IBS being found and is now being treated for. ***     -She has had prolonged menstrual period since 2 weeks ago. Labs reviewed, hg at 9.7, worse than before, likely secondary to her blood loss from menorrhagia.  Her ferritin was 248 yesterday, saturation at 16. She does not need blood transfusion.  Due to her ongoing blood loss, I anticipate her anemia will get worse, I have arranged IV Feraheme tomorrow.  -Will check her Vitamin D with next lab -I encouraged  her to stay active to combat her fatigue.  -We will continue lab monitoring every 4 weeks, will set up IV Feraheme if she has low ferritin (<150) or low serum iron level  -F/u in 4 months   2. Morbid Obesity, menorrhagia, irregular menstrual period -She will follow-up with her gynecologist  -I previously encouraged her to eat healthy and exercise, and try to lose weight.  -I previously discussed the option of bariatric surgery and what that would include and how this would effect her anemia and fatigue. She is scared of surgery, does not plan to do.  -She  underwent C7D on 06/27/17 -I recommend she take part of our weight management program given her obesity. *** She may be in [place] weight loss program already?  3.  Pituitary adenoma and hyperprolactinemia  -She has seen endocrinologist Dr. [name]. She is on dostinex, will continue f/u   4. IBS -She is being followed by GI Dr. Henrene Pastor who put her on Colestid  5. Fatigue *** -This is likely multifactorial. Her fatigue did not improve after IV iron for her anemia.  Anemia is much improved, I do not think her fatigue is mainly from her anemia. -I again strongly encouraged her to exercise, and try to lose some weight.    PLAN:  -***  She is scheduled for IV Feraheme tomorrow  -lab every 4 weeks X4, consider IV Feraheme if ferritin less than 150 or low serum iron level -F/u in 4 months  All questions were answered. The patient knows to call the clinic with any problems, questions or concerns.    Joslyn Devon 07/21/17   Oneal Deputy, am acting as scribe for Truitt Merle, MD.   {Add scribe attestation statement}

## 2017-07-21 NOTE — Telephone Encounter (Signed)
Patient had D&C on June 24. Started her cycle today and wanted to know if it was too early.

## 2017-07-21 NOTE — Telephone Encounter (Signed)
Left message to call Ana Padilla at [phone].  

## 2017-07-22 ENCOUNTER — Telehealth: Payer: Self-pay

## 2017-07-22 ENCOUNTER — Inpatient Hospital Stay: Payer: 59 | Admitting: Hematology

## 2017-07-22 NOTE — Telephone Encounter (Signed)
Please advise 

## 2017-07-22 NOTE — Telephone Encounter (Signed)
Copied from [place] [id number]. Topic: General - Other >> Jul 22, 2017  8:06 AM Yvette Rack wrote: Reason for CRM: pt calling wanting Dr Volanda Napoleon Assistant to give her a call about starting a new medication called Qsymia

## 2017-07-22 NOTE — Telephone Encounter (Signed)
Spoke with Ana Padilla voiced understanding that Dr Volanda Napoleon wants to see her in the office, Ana Padilla states that she will call the office to schedule an appointment

## 2017-07-25 NOTE — Telephone Encounter (Signed)
Spoke with patient. Started on Aygestin 5mg  bid on 07/04/17. D&C on 06/27/17, LMP 06/04/17. Has experienced some intermittent spotting since starting aygestin. Patient reports she is doing well, denies any other symptoms.   1. Patient asking how long she will continue aygestin?   2. Cycle/ Bleeding expectations with aygestin?   Advised will review with Dr. Quincy Simmonds and return call, patient agreeable.   Dr. Quincy Simmonds -please advise.

## 2017-07-26 NOTE — Telephone Encounter (Signed)
Left detailed message, advised as seen below per Dr. Quincy Simmonds. Advised to return call to office if any additional questions/concerns. Encounter closed.

## 2017-07-26 NOTE — Telephone Encounter (Signed)
The goal with the Aygestin is to stabilize the lining of the uterus and stop her bleeding.  If she is having spotting only, this is fine.  She is doing a three month treatment of the high dose progesterone, and then we may be able to start reducing her dosage.

## 2017-07-28 NOTE — H&P (Signed)
Subjective:    Ana Padilla is a 32 y.o. female who presents for evaluation of entropion and trichiasis left lower eyelid. The pain is described as none. Onset was several months ago. Symptoms have been unchanged since.   Review of Systems Pertinent items are noted in HPI.    Objective:   There were no vitals taken for this visit.  General:  alert, cooperative and appears stated age Skin:  normal Eyes: positive findings: eyelids/periorbital: entropion on the right Mouth: MMM no lesions Lymph Nodes:  Cervical, supraclavicular, and axillary nodes normal. Lungs:  clear to auscultation bilaterally Heart:  regular rate and rhythm, S1, S2 normal, no murmur, click, rub or gallop Abdomen: soft, non-tender; bowel sounds normal; no masses,  no organomegaly CVA:  absent Genitourinary: defer exam Extremities:  extremities normal, atraumatic, no cyanosis or edema Neurologic:  negative Psychiatric:  normal mood, behavior, speech, dress, and thought processes    Assessment: Entropion with Trichiasis Right Lower Lid   Plan: Entropion Repair with Lateral Tarsal Strip and Ellman Ablation Right Lower Lid  1. Discussed the risk of surgery,  and the risks of general anesthetic including MI, CVA, sudden death or even reaction to anesthetic medications. The patient understands the risks, any and all questions were answered to the patient's satisfaction. 2. Follow up: 2 weeks.  Date of Surgery Update (To be completed by Attending Surgeon day of surgery.)

## 2017-08-02 ENCOUNTER — Encounter (HOSPITAL_COMMUNITY): Payer: Self-pay | Admitting: *Deleted

## 2017-08-02 NOTE — Progress Notes (Signed)
Denies chest pain, shortness of breath, or cardiologist.

## 2017-08-04 ENCOUNTER — Ambulatory Visit (HOSPITAL_COMMUNITY): Payer: 59 | Admitting: Certified Registered Nurse Anesthetist

## 2017-08-04 ENCOUNTER — Encounter (HOSPITAL_COMMUNITY)
Admission: RE | Disposition: A | Discharge status: Home / Self Care | Payer: Self-pay | Source: Ambulatory Visit | Attending: Oculoplastics Ophthalmology

## 2017-08-04 ENCOUNTER — Encounter (HOSPITAL_COMMUNITY): Payer: Self-pay

## 2017-08-04 ENCOUNTER — Ambulatory Visit (HOSPITAL_COMMUNITY)
Admission: RE | Admit: 2017-08-04 | Discharge: 2017-08-04 | Disposition: A | Discharge status: Home / Self Care | Payer: 59 | Source: Ambulatory Visit | Attending: Oculoplastics Ophthalmology | Admitting: Oculoplastics Ophthalmology

## 2017-08-04 DIAGNOSIS — D500 Iron deficiency anemia secondary to blood loss (chronic): Secondary | ICD-10-CM | POA: Diagnosis not present

## 2017-08-04 DIAGNOSIS — D573 Sickle-cell trait: Secondary | ICD-10-CM | POA: Diagnosis not present

## 2017-08-04 DIAGNOSIS — Z6841 Body Mass Index (BMI) 40.0 and over, adult: Secondary | ICD-10-CM | POA: Diagnosis not present

## 2017-08-04 DIAGNOSIS — H02022 Mechanical entropion of right lower eyelid: Secondary | ICD-10-CM | POA: Diagnosis not present

## 2017-08-04 DIAGNOSIS — H02002 Unspecified entropion of right lower eyelid: Secondary | ICD-10-CM | POA: Diagnosis present

## 2017-08-04 DIAGNOSIS — H02009 Unspecified entropion of unspecified eye, unspecified eyelid: Secondary | ICD-10-CM | POA: Diagnosis not present

## 2017-08-04 DIAGNOSIS — H02003 Unspecified entropion of right eye, unspecified eyelid: Secondary | ICD-10-CM | POA: Diagnosis not present

## 2017-08-04 HISTORY — PX: ENTROPIAN REPAIR: SHX1512

## 2017-08-04 HISTORY — PX: ELECTROLYSIS OF MISDIRECTED LASHES: SHX6483

## 2017-08-04 LAB — PROTIME-INR
INR: 0.92
Prothrombin Time: 12.3 seconds (ref 11.4–15.2)

## 2017-08-04 LAB — BASIC METABOLIC PANEL
Anion gap: 9 (ref 5–15)
BUN: 9 mg/dL (ref 6–20)
CO2: 23 mmol/L (ref 22–32)
Calcium: 9.5 mg/dL (ref 8.9–10.3)
Chloride: 107 mmol/L (ref 98–111)
Creatinine, Ser: 0.78 mg/dL (ref 0.44–1.00)
GFR calc Af Amer: >60 mL/min (ref >60)
GFR calc non Af Amer: >60 mL/min (ref >60)
Glucose, Bld: 106 mg/dL — ABNORMAL HIGH (ref 70–99)
Potassium: 3.8 mmol/L (ref 3.5–5.1)
Sodium: 139 mmol/L (ref 135–145)

## 2017-08-04 LAB — CBC
HCT: 35.6 % — ABNORMAL LOW (ref 36.0–46.0)
Hemoglobin: 10.4 g/dL — ABNORMAL LOW (ref 12.0–15.0)
MCH: 21.4 pg — ABNORMAL LOW (ref 26.0–34.0)
MCHC: 29.2 g/dL — ABNORMAL LOW (ref 30.0–36.0)
MCV: 73.1 fL — ABNORMAL LOW (ref 78.0–100.0)
Platelets: 520 10*3/uL — ABNORMAL HIGH (ref 150–400)
RBC: 4.87 MIL/uL (ref 3.87–5.11)
RDW: 16.8 % — ABNORMAL HIGH (ref 11.5–15.5)
WBC: 7.9 10*3/uL (ref 4.0–10.5)

## 2017-08-04 LAB — POCT PREGNANCY, URINE: Preg Test, Ur: NEGATIVE

## 2017-08-04 SURGERY — REPAIR, ENTROPION
Anesthesia: General | Site: Eye | Laterality: Right

## 2017-08-04 MED ORDER — LIDOCAINE HCL (CARDIAC) PF 100 MG/5ML IV SOSY
PREFILLED_SYRINGE | INTRAVENOUS | Status: DC | PRN
  Administered 2017-08-04: 60 mg via INTRAVENOUS

## 2017-08-04 MED ORDER — PROPOFOL 10 MG/ML IV BOLUS
INTRAVENOUS | Status: DC | PRN
  Administered 2017-08-04: 300 mg via INTRAVENOUS

## 2017-08-04 MED ORDER — TETRACAINE HCL 0.5 % OP SOLN
OPHTHALMIC | Status: AC
  Filled 2017-08-04: qty 4

## 2017-08-04 MED ORDER — FENTANYL CITRATE (PF) 250 MCG/5ML IJ SOLN
INTRAMUSCULAR | Status: DC | PRN
  Administered 2017-08-04: 25 ug via INTRAVENOUS
  Administered 2017-08-04: 75 ug via INTRAVENOUS

## 2017-08-04 MED ORDER — DEXAMETHASONE SODIUM PHOSPHATE 10 MG/ML IJ SOLN
INTRAMUSCULAR | Status: AC
  Filled 2017-08-04: qty 1

## 2017-08-04 MED ORDER — LIDOCAINE 2% (20 MG/ML) 5 ML SYRINGE
INTRAMUSCULAR | Status: AC
  Filled 2017-08-04: qty 5

## 2017-08-04 MED ORDER — FENTANYL CITRATE (PF) 100 MCG/2ML IJ SOLN
INTRAMUSCULAR | Status: AC
  Filled 2017-08-04: qty 2

## 2017-08-04 MED ORDER — PHENYLEPHRINE HCL 2.5 % OP SOLN
OPHTHALMIC | Status: AC
  Filled 2017-08-04: qty 2

## 2017-08-04 MED ORDER — SUCCINYLCHOLINE CHLORIDE 200 MG/10ML IV SOSY
PREFILLED_SYRINGE | INTRAVENOUS | Status: AC
  Filled 2017-08-04: qty 10

## 2017-08-04 MED ORDER — MIDAZOLAM HCL 2 MG/2ML IJ SOLN
INTRAMUSCULAR | Status: DC | PRN
  Administered 2017-08-04: 2 mg via INTRAVENOUS

## 2017-08-04 MED ORDER — BUPIVACAINE HCL (PF) 0.5 % IJ SOLN
INTRAMUSCULAR | Status: AC
  Filled 2017-08-04: qty 10

## 2017-08-04 MED ORDER — ONDANSETRON HCL 4 MG/2ML IJ SOLN
INTRAMUSCULAR | Status: AC
  Filled 2017-08-04: qty 2

## 2017-08-04 MED ORDER — LIDOCAINE-EPINEPHRINE 1 %-1:100000 IJ SOLN
INTRAMUSCULAR | Status: DC | PRN
  Administered 2017-08-04: 1 mL

## 2017-08-04 MED ORDER — FENTANYL CITRATE (PF) 100 MCG/2ML IJ SOLN
25.0000 ug | INTRAMUSCULAR | Status: DC | PRN
  Administered 2017-08-04: 25 ug via INTRAVENOUS

## 2017-08-04 MED ORDER — SODIUM CHLORIDE 0.9 % IV SOLN
INTRAVENOUS | Status: DC
  Administered 2017-08-04: 08:00:00 via INTRAVENOUS

## 2017-08-04 MED ORDER — HYDROCODONE-ACETAMINOPHEN 5-325 MG PO TABS: 1.0000 | ORAL_TABLET | Freq: Four times a day (QID) | ORAL | 0 refills | Status: AC | PRN

## 2017-08-04 MED ORDER — BSS IO SOLN
INTRAOCULAR | Status: AC
  Filled 2017-08-04: qty 15

## 2017-08-04 MED ORDER — TOBRAMYCIN-DEXAMETHASONE 0.3-0.1 % OP OINT
TOPICAL_OINTMENT | OPHTHALMIC | Status: AC
  Filled 2017-08-04: qty 3.5

## 2017-08-04 MED ORDER — LIDOCAINE HCL (PF) 1 % IJ SOLN
INTRAMUSCULAR | Status: AC
  Filled 2017-08-04: qty 5

## 2017-08-04 MED ORDER — DEXAMETHASONE SODIUM PHOSPHATE 10 MG/ML IJ SOLN
INTRAMUSCULAR | Status: DC | PRN
  Administered 2017-08-04: 10 mg via INTRAVENOUS

## 2017-08-04 MED ORDER — TOBRAMYCIN-DEXAMETHASONE 0.3-0.1 % OP OINT
TOPICAL_OINTMENT | OPHTHALMIC | Status: DC | PRN
  Administered 2017-08-04: 1 via OPHTHALMIC

## 2017-08-04 MED ORDER — FENTANYL CITRATE (PF) 250 MCG/5ML IJ SOLN
INTRAMUSCULAR | Status: AC
  Filled 2017-08-04: qty 5

## 2017-08-04 MED ORDER — MIDAZOLAM HCL 2 MG/2ML IJ SOLN
INTRAMUSCULAR | Status: AC
  Filled 2017-08-04: qty 2

## 2017-08-04 MED ORDER — SUCCINYLCHOLINE CHLORIDE 20 MG/ML IJ SOLN
INTRAMUSCULAR | Status: DC | PRN
  Administered 2017-08-04: 120 mg via INTRAVENOUS

## 2017-08-04 MED ORDER — PROPOFOL 10 MG/ML IV BOLUS
INTRAVENOUS | Status: AC
  Filled 2017-08-04: qty 40

## 2017-08-04 MED ORDER — ONDANSETRON HCL 4 MG/2ML IJ SOLN
INTRAMUSCULAR | Status: DC | PRN
  Administered 2017-08-04: 4 mg via INTRAVENOUS

## 2017-08-04 SURGICAL SUPPLY — 30 items
APPLICATOR COTTON TIP 6IN STRL (MISCELLANEOUS) ×2 IMPLANT
APPLICATOR DR MATTHEWS STRL (MISCELLANEOUS) ×2 IMPLANT
BLADE SURG 15 STRL LF DISP TIS (BLADE) ×1 IMPLANT
BLADE SURG 15 STRL SS (BLADE) ×1
CORDS BIPOLAR (ELECTRODE) ×2 IMPLANT
COVER SURGICAL LIGHT HANDLE (MISCELLANEOUS) ×2 IMPLANT
DRAPE ORTHO SPLIT 87X125 STRL (DRAPES) ×2 IMPLANT
DRAPE SURG 17X23 STRL (DRAPES) ×4 IMPLANT
DRAPE UTILITY XL STRL (DRAPES) ×2 IMPLANT
ELECT NEEDLE BLADE 2-5/6 (NEEDLE) ×2 IMPLANT
ELECT REM PT RETURN 9FT ADLT (ELECTROSURGICAL) ×2
ELECTRODE REM PT RTRN 9FT ADLT (ELECTROSURGICAL) ×1 IMPLANT
FORCEPS BIPOLAR SPETZLER 8 1.0 (NEUROSURGERY SUPPLIES) ×2 IMPLANT
GLOVE BIO SURGEON STRL SZ7.5 (GLOVE) ×4 IMPLANT
GOWN STRL REUS W/ TWL LRG LVL3 (GOWN DISPOSABLE) ×2 IMPLANT
GOWN STRL REUS W/TWL LRG LVL3 (GOWN DISPOSABLE) ×2
KIT BASIN OR (CUSTOM PROCEDURE TRAY) ×2 IMPLANT
NEEDLE PRECISIONGLIDE 27X1.5 (NEEDLE) IMPLANT
NS IRRIG 1000ML POUR BTL (IV SOLUTION) ×2 IMPLANT
PACK CATARACT CUSTOM (CUSTOM PROCEDURE TRAY) ×2 IMPLANT
PAD ARMBOARD 7.5X6 YLW CONV (MISCELLANEOUS) ×4 IMPLANT
PENCIL BUTTON HOLSTER BLD 10FT (ELECTRODE) ×2 IMPLANT
STRIP CLOSURE SKIN 1/2X4 (GAUZE/BANDAGES/DRESSINGS) ×2 IMPLANT
SUT CHROMIC 5 0 P 3 (SUTURE) ×2 IMPLANT
SUT ETHILON 7 0 P 6 18 (SUTURE) ×2 IMPLANT
SUT MERSILENE 5 0 RD 1 DA (SUTURE) ×2 IMPLANT
SUT PLAIN 5 0 P 3 18 (SUTURE) ×2 IMPLANT
SUT SILK 4 0 P 3 (SUTURE) ×2 IMPLANT
TOWEL OR 17X24 6PK STRL BLUE (TOWEL DISPOSABLE) ×4 IMPLANT
WATER STERILE IRR 1000ML POUR (IV SOLUTION) ×2 IMPLANT

## 2017-08-04 NOTE — Op Note (Signed)
Procedure(s): REPAIR OF ENTROPION WITH TARSAL STRIP RIGHT EYE ELECTROLYSIS OF MISDIRECTED LASHES RIGHT EYE Procedure Note  Ana Padilla female 32 y.o. 08/04/2017  Procedure(s) and Anesthesia Type:    * REPAIR OF ENTROPION WITH TARSAL STRIP RIGHT EYE - General    * ELECTROLYSIS OF MISDIRECTED LASHES RIGHT EYE - General  Surgeon(s) and Role:    * Abugo, Peyton Najjar, MD - Primary      Surgeon: Clista Bernhardt   Assistants: none   Anesthesia: General endotracheal anesthesia and Local anesthesia 2% buffered lidocaine, with epinephrine  ASA Class: 2  Procedure Detail  REPAIR OF ENTROPION WITH TARSAL STRIP RIGHT EYE, ELECTROLYSIS OF MISDIRECTED LASHES RIGHT EYE       INDICATIONS:  The patient has right lower eyelid entropion of the eyelids in addition to 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  Informed consent had been provided prior to the day of surgery at the patient's clinic visit.  All questions were answered.  The patient understands that the goal of surgery is to improve visual function. Prior to entering the operative suite, the general surgical consent was signed. All questions were answered.  The patient/advocate understands the risks of surgery include but are not limited to bleeding, infection, scar formation, asymmetry, the need for additional surgical intervention and other less common outcomes noted on the specific eyelid informed consent and anesthesia risks listed on the anesthesia consent. The patient accepts the risks and desires to proceed with surgery        Attention was then directed to the right lower eyelid which had been previously injected with the standard oculoplastic block. Upon observation the patient's eyelid margin was in the appropriate position so a tarsal strip was not performed. The patient had an excess amount of orbicularis causing a  mechanical entropion which was only observable when the patient was supine. Subciliary orbicularis was excised and the fat was removed to allow for adequate rotation of the eyelid margin with suture closure with a 5-0 plain gut.  Then the Ellman Ablation unit was used to remove the medial upper and lower eyelashes that were abnormally turning inwards. The unit was set on 6, monopolar, cut mode. Then the ablated lashes were removed with forceps.   The patient tolerated the procedure well and returned to the post anesthesia care unit in good condition.  Findings: None   Estimated Blood Loss:  Minimal         Drains: none         Total IV Fluids: <1L  Blood Given: none          Specimens: none         Implants: none        Complications:  * No complications entered in OR log *         Disposition: PACU - hemodynamically stable.         Condition: stable

## 2017-08-04 NOTE — Anesthesia Postprocedure Evaluation (Signed)
Anesthesia Post Note  Patient: [name]  Procedure(s) Performed: REPAIR OF ENTROPION WITH TARSAL STRIP RIGHT EYE (Right Eye) ELECTROLYSIS OF MISDIRECTED LASHES RIGHT EYE (Right Eye)     Patient location during evaluation: PACU Anesthesia Type: General Level of consciousness: awake and alert Pain management: pain level controlled Vital Signs Assessment: post-procedure vital signs reviewed and stable Respiratory status: spontaneous breathing, nonlabored ventilation and respiratory function stable Cardiovascular status: blood pressure returned to baseline and stable Postop Assessment: no apparent nausea or vomiting Anesthetic complications: no    Last Vitals:  Vitals:   08/04/17 1115 08/04/17 1139  BP: 123/80 131/81  Pulse: 84 74  Resp: 15 18  Temp:    SpO2: 97% 100%    Last Pain:  Vitals:   08/04/17 1139  TempSrc:   PainSc: 0-No pain                 Kelia Gibbon,W. EDMOND

## 2017-08-04 NOTE — Anesthesia Procedure Notes (Signed)
Procedure Name: Intubation Date/Time: 08/04/2017 9:43 AM Performed by: Raenette Rover, CRNA Pre-anesthesia Checklist: Patient identified, Emergency Drugs available, Suction available and Patient being monitored Patient Re-evaluated:Patient Re-evaluated prior to induction Oxygen Delivery Method: Circle system utilized Preoxygenation: Pre-oxygenation with 100% oxygen Induction Type: IV induction Ventilation: Mask ventilation without difficulty Laryngoscope Size: Mac and 3 Grade View: Grade I Tube type: Oral Tube size: 7.0 mm Number of attempts: 1 Airway Equipment and Method: Stylet Placement Confirmation: ETT inserted through vocal cords under direct vision,  positive ETCO2,  CO2 detector and breath sounds checked- equal and bilateral Secured at: 20 cm Tube secured with: Tape Dental Injury: Teeth and Oropharynx as per pre-operative assessment

## 2017-08-04 NOTE — Anesthesia Preprocedure Evaluation (Addendum)
Anesthesia Evaluation  Patient identified by MRN, date of birth, ID band Patient awake    Reviewed: Allergy & Precautions, H&P , NPO status , Patient's Chart, lab work & pertinent test results  Airway Mallampati: II  TM Distance: >3 FB Neck ROM: Full    Dental no notable dental hx. (+) Teeth Intact, Dental Advisory Given   Pulmonary neg pulmonary ROS,    Pulmonary exam normal breath sounds clear to auscultation       Cardiovascular negative cardio ROS   Rhythm:Regular Rate:Normal     Neuro/Psych negative neurological ROS  negative psych ROS   GI/Hepatic negative GI ROS, Neg liver ROS,   Endo/Other  Morbid obesity  Renal/GU negative Renal ROS  negative genitourinary   Musculoskeletal   Abdominal   Peds  Hematology  (+) Sickle cell trait ,   Anesthesia Other Findings   Reproductive/Obstetrics negative OB ROS                            Anesthesia Physical Anesthesia Plan  ASA: II  Anesthesia Plan: General   Post-op Pain Management:    Induction: Intravenous  PONV Risk Score and Plan: 4 or greater and Ondansetron, Dexamethasone and Midazolam  Airway Management Planned: Oral ETT  Additional Equipment:   Intra-op Plan:   Post-operative Plan: Extubation in OR  Informed Consent: I have reviewed the patients History and Physical, chart, labs and discussed the procedure including the risks, benefits and alternatives for the proposed anesthesia with the patient or authorized representative who has indicated his/her understanding and acceptance.   Dental advisory given  Plan Discussed with: CRNA  Anesthesia Plan Comments:        Anesthesia Quick Evaluation

## 2017-08-04 NOTE — Interval H&P Note (Signed)
History and Physical Interval Note:  08/04/2017 9:14 AM  Ana Padilla  has presented today for surgery, with the diagnosis of entropion with trichiasis  The various methods of treatment have been discussed with the patient and family. After consideration of risks, benefits and other options for treatment, the patient has consented to  Procedure(s): REPAIR OF ENTROPION WITH TARSAL STRIP RIGHT EYE (Right) ELECTROLYSIS OF MISDIRECTED LASHES RIGHT EYE (Right) as a surgical intervention .  The patient's history has been reviewed, patient examined, no change in status, stable for surgery.  I have reviewed the patient's chart and labs.  Questions were answered to the patient's satisfaction.     Clista Bernhardt

## 2017-08-04 NOTE — Brief Op Note (Signed)
08/04/2017  9:15 AM  PATIENT:  Ana Padilla  32 y.o. female  PRE-OPERATIVE DIAGNOSIS:  entropion with trichiasis  POST-OPERATIVE DIAGNOSIS:  * No post-op diagnosis entered *  PROCEDURE:  Procedure(s): REPAIR OF ENTROPION WITH TARSAL STRIP RIGHT EYE (Right) ELECTROLYSIS OF MISDIRECTED LASHES RIGHT EYE (Right)  SURGEON:  Surgeon(s) and Role:    * Abugo, Peyton Najjar, MD - Primary  PHYSICIAN ASSISTANT: none   ASSISTANTS: none   ANESTHESIA:   local and general  EBL:  minimal   BLOOD ADMINISTERED:none  DRAINS: none   LOCAL MEDICATIONS USED:  BUPIVICAINE  and LIDOCAINE   SPECIMEN:  No Specimen  DISPOSITION OF SPECIMEN:  N/A  COUNTS:  YES  TOURNIQUET:  * No tourniquets in log *  DICTATION: .Note written in EPIC  PLAN OF CARE: Discharge to home after PACU  PATIENT DISPOSITION:  PACU - hemodynamically stable.   Delay start of Pharmacological VTE agent (>24hrs) due to surgical blood loss or risk of bleeding: no

## 2017-08-04 NOTE — Transfer of Care (Signed)
Immediate Anesthesia Transfer of Care Note  Patient: Ana Padilla  Procedure(s) Performed: REPAIR OF ENTROPION WITH TARSAL STRIP RIGHT EYE (Right Eye) ELECTROLYSIS OF MISDIRECTED LASHES RIGHT EYE (Right Eye)  Patient Location: PACU  Anesthesia Type:General  Level of Consciousness: awake, alert , oriented, drowsy and patient cooperative  Airway & Oxygen Therapy: Patient Spontanous Breathing and Patient connected to nasal cannula oxygen  Post-op Assessment: Report given to RN and Post -op Vital signs reviewed and stable  Post vital signs: Reviewed and stable  Last Vitals:  Vitals Value Taken Time  BP 119/75 08/04/2017 10:32 AM  Temp    Pulse 93 08/04/2017 10:32 AM  Resp 21 08/04/2017 10:32 AM  SpO2 100 % 08/04/2017 10:32 AM  Vitals shown include unvalidated device data.  Last Pain:  Vitals:   08/04/17 0743  TempSrc: Oral  PainSc:       Patients Stated Pain Goal: 3 (16/60/60 0459)  Complications: No apparent anesthesia complications

## 2017-08-04 NOTE — Discharge Instructions (Signed)
Keep patch on for 24 Hrs. Do not get the patch wet.  Sleep on back with head elevated. Place tobradex or maxitrol ointment on the right lower eyelid at nighttime for 1 week.

## 2017-08-05 ENCOUNTER — Encounter (HOSPITAL_COMMUNITY): Payer: Self-pay | Admitting: Oculoplastics Ophthalmology

## 2017-08-08 ENCOUNTER — Ambulatory Visit: Payer: 59 | Admitting: Hematology

## 2017-08-23 ENCOUNTER — Ambulatory Visit: Payer: 59 | Admitting: Internal Medicine

## 2017-08-29 ENCOUNTER — Telehealth: Payer: Self-pay | Admitting: Obstetrics and Gynecology

## 2017-08-29 NOTE — Telephone Encounter (Signed)
Left message on voicemail to call and reschedule cancelled appointment. °

## 2017-08-31 ENCOUNTER — Inpatient Hospital Stay: Payer: 59 | Attending: Hematology

## 2017-08-31 DIAGNOSIS — N920 Excessive and frequent menstruation with regular cycle: Secondary | ICD-10-CM | POA: Insufficient documentation

## 2017-08-31 DIAGNOSIS — D573 Sickle-cell trait: Secondary | ICD-10-CM | POA: Diagnosis not present

## 2017-08-31 DIAGNOSIS — K589 Irritable bowel syndrome without diarrhea: Secondary | ICD-10-CM | POA: Insufficient documentation

## 2017-08-31 DIAGNOSIS — R5383 Other fatigue: Secondary | ICD-10-CM | POA: Insufficient documentation

## 2017-08-31 DIAGNOSIS — E669 Obesity, unspecified: Secondary | ICD-10-CM | POA: Diagnosis not present

## 2017-08-31 DIAGNOSIS — D500 Iron deficiency anemia secondary to blood loss (chronic): Secondary | ICD-10-CM | POA: Insufficient documentation

## 2017-08-31 LAB — CBC WITH DIFFERENTIAL (CANCER CENTER ONLY)
Basophils Absolute: 0 10*3/uL (ref 0.0–0.1)
Basophils Relative: 0 %
Eosinophils Absolute: 0.2 10*3/uL (ref 0.0–0.5)
Eosinophils Relative: 2 %
HCT: 34.6 % — ABNORMAL LOW (ref 34.8–46.6)
Hemoglobin: 10.8 g/dL — ABNORMAL LOW (ref 11.6–15.9)
Lymphocytes Relative: 22 %
Lymphs Abs: 2.4 10*3/uL (ref 0.9–3.3)
MCH: 21.6 pg — ABNORMAL LOW (ref 25.1–34.0)
MCHC: 31.2 g/dL — ABNORMAL LOW (ref 31.5–36.0)
MCV: 69.2 fL — ABNORMAL LOW (ref 79.5–101.0)
Monocytes Absolute: 0.6 10*3/uL (ref 0.1–0.9)
Monocytes Relative: 5 %
Neutro Abs: 7.8 10*3/uL — ABNORMAL HIGH (ref 1.5–6.5)
Neutrophils Relative %: 71 %
Platelet Count: 475 10*3/uL — ABNORMAL HIGH (ref 145–400)
RBC: 5 MIL/uL (ref 3.70–5.45)
RDW: 17.2 % — ABNORMAL HIGH (ref 11.2–14.5)
WBC Count: 11.1 10*3/uL — ABNORMAL HIGH (ref 3.9–10.3)

## 2017-08-31 LAB — RETICULOCYTES
RBC.: 5 MIL/uL (ref 3.70–5.45)
Retic Count, Absolute: 85 10*3/uL (ref 33.7–90.7)
Retic Ct Pct: 1.7 % (ref 0.7–2.1)

## 2017-08-31 NOTE — Progress Notes (Signed)
[facility]  [phone] Fax:([phone]) [phone]  Clinic follow Up Note   Patient Care Team: [name], MD as PCP - General (Family Medicine)   Date of Service:  08/31/2017  CHIEF COMPLAINTS:  Follow up Iron deficiency anemia   HISTORY OF PRESENTING ILLNESS:  Ana Padilla 32 y.o. 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 She recently moved to [facility] from [place] in April because of her boyfriend. She was followed for several medical conditions including anemia and h/o pituitary adenoma in that area. Overall, in her daily activities she does feel somewhat restricted due to her fatigue, but there are no other prohibitive symptoms. Of note, she is adopted and unsure of her father's medical history. She does note that her mother's side of the family does have leukemia, but otherwise she is unsure of their history as well. She does drink alcohol socially as well.  She recently established care with Dr Volanda Napoleon, Langley Adie, MD for her primary care. She had basic labs drawn which showed a Hgb level of 9.9, HCT of 31.8, MCV of 62.7, and increased platelets at 445.0K. Prolactin was also notable to be elevated at 54.6, endocrinology referral was placed. Additionally, Vitamin D level was low at 20.41 as well. She was started on Ergocalciferol 50000 IU every week for 8 weeks. She will begin OTC Vitamin D 1K-2K IU daily. She was subsequently referred into Hematology following this.   She states has been diagnosed with anemia for approximately 12 years now. She reports that her counts typically drop around the time of her menses, during which time she will become noticeably fatigued. Her menses have been irregular at baseline.  Her menses usually occur every 3-4 months and they are very heavy at baseline. She typically has to change her tampon every 30 minutes and does have the passage of clots as well. The longest period has been 13 days and  she reports that it will be consistently heavy until the last two days of her menses before it tapers off.   Additionally, she was noted to have a 40lbs weight gain at the beginning of the year which was rapid and unexpected. This was thought to be due to Cushing's syndrome, however, she was tested for this and it was negative.   Her lowest Hgb level in the past to her knowledge was 8.0, but she has never had to previously taken any blood transfusions. She has previously had one infusion of IV Iron and she notes that following this she began a menses and this was still very heavy. She is currently taking Vitamin D and iron supplement with OJ in the mornings, but she remained fatigue throughout the day. She also is currently taking Cabergoline for her elevated Prolactin and she has not noticed any changes in her fatigue related to this.   On review of systems, pt denies fever, chills, rash, mouth sores, weight loss, decreased appetite, urinary complaints. Denies pain. Pt denies abdominal pain, nausea, vomiting.   CURRENT THERAPY: oral ferrous sulfate, IV Feraheme as needed (11/09/16, 11/26/16, 04/01/17, 04/15/17, 06/06/17, 09/01/2017)  INTERIM HISTORY:   [name] returns for follow-up for her ion deficient anemia. She is here at the infusion room. She had right eyelid surgery recently and is healing well. She underwent D&C for menorrhagia has been on Aygestin 5 mg on 07/04/2017 and has not had menses in 2 months, but is expected to have them again  soon.  She does notice slightly worse fatigue lately.  She is able to function well during the day.  She also had dilation and curettage with benign polyp on pathology on 06/27/2017   MEDICAL HISTORY:  Past Medical History:  Diagnosis Date  . Adenoma of pituitary ([place]) 08/2014  . Anemia   . IBS (irritable bowel syndrome)   . Seasonal allergies   . Sickle cell trait ([place])     SURGICAL HISTORY: Past Surgical History:  Procedure Laterality Date  .  BLEPHAROPLASTY Right    lower lid x 3  . CHOLECYSTECTOMY  2016  . ELECTROLYSIS OF MISDIRECTED LASHES Right 08/04/2017   Procedure: ELECTROLYSIS OF MISDIRECTED LASHES RIGHT EYE;  Surgeon: Clista Bernhardt, MD;  Location: [facility];  Service: Ophthalmology;  Laterality: Right;  . ENTROPIAN REPAIR Right 08/04/2017   Procedure: REPAIR OF ENTROPION WITH TARSAL STRIP RIGHT EYE;  Surgeon: Clista Bernhardt, MD;  Location: [facility];  Service: Ophthalmology;  Laterality: Right;  . HYSTEROSCOPY W/D&C N/A 06/27/2017   Procedure: DILATATION & CURETTAGE/HYSTEROSCOPY;  Surgeon: Nunzio Cobbs, MD;  Location: [facility];  Service: Gynecology;  Laterality: N/A;  . iud placement     been removed    SOCIAL HISTORY: Social History   Socioeconomic History  . Marital status: Single    Spouse name: Not on file  . Number of children: 0  . Years of education: Not on file  . Highest education level: Not on file  Occupational History  . Occupation: Engineer, maintenance ([employer])  Social Needs  . Financial resource strain: Not on file  . Food insecurity:    Worry: Not on file    Inability: Not on file  . Transportation needs:    Medical: Not on file    Non-medical: Not on file  Tobacco Use  . Smoking status: Never Smoker  . Smokeless tobacco: Never Used  Substance and Sexual Activity  . Alcohol use: Yes    Comment: socially -1 per week  . Drug use: No  . Sexual activity: Yes    Birth control/protection: Condom  Lifestyle  . Physical activity:    Days per week: Not on file    Minutes per session: Not on file  . Stress: Not on file  Relationships  . Social connections:    Talks on phone: Not on file    Gets together: Not on file    Attends religious service: Not on file    Active member of club or organization: Not on file    Attends meetings of clubs or organizations: Not on file    Relationship status: Not on file  . Intimate partner violence:    Fear of current or ex partner: Not on file     Emotionally abused: Not on file    Physically abused: Not on file    Forced sexual activity: Not on file  Other Topics Concern  . Not on file  Social History Narrative  . Not on file    FAMILY HISTORY: Family History  Adopted: Yes  Problem Relation Age of Onset  . Sickle cell trait Mother   . Heart disease Father     ALLERGIES:  is allergic to food and pollinex-t [modified tree tyrosine adsorbate].  MEDICATIONS:  Current Outpatient Medications  Medication Sig Dispense Refill  . acetaminophen (TYLENOL) 500 MG tablet Take 1 tablet (500 mg total) by mouth every 6 (six) hours as needed for mild pain or moderate pain. 30 tablet 0  .  cabergoline (DOSTINEX) 0.5 MG tablet Take 0.5 tablets (0.25 mg total) by mouth once a week. (Patient taking differently: Take 0.25 mg by mouth 2 (two) times a week. ) 10 tablet 2  . cetirizine (ZYRTEC) 10 MG tablet Take 10 mg by mouth daily.     . Cholecalciferol (VITAMIN D) 2000 units CAPS Take 2,000 Units by mouth daily.     . colestipol (COLESTID) 1 g tablet Take 2 tablets (2 g total) by mouth 2 (two) times daily. (Patient taking differently: Take 1 g by mouth daily. ) 120 tablet 2  . ferrous sulfate 325 (65 FE) MG tablet Take 325 mg by mouth 3 (three) times daily with meals.     . fluticasone (FLONASE) 50 MCG/ACT nasal spray Place 2 sprays into both nostrils daily.    Marland Kitchen ibuprofen (ADVIL,MOTRIN) 800 MG tablet Take 1 tablet (800 mg total) by mouth every 8 (eight) hours as needed. 30 tablet 0  . Multiple Vitamin (MULTIVITAMIN WITH MINERALS) TABS tablet Take 1 tablet by mouth daily.    . norethindrone (AYGESTIN) 5 MG tablet Take one tablet, 5 mg, by mouth twice daily. (Patient taking differently: Take 10 mg by mouth daily. Take one tablet, 5 mg, by mouth twice daily.) 60 tablet 2  . Tetrahydrozoline HCl (VISINE OP) Place 1 drop into both eyes daily as needed (dry eyes).      No current facility-administered medications for this visit.     REVIEW OF  SYSTEMS:   Constitutional: Denies fevers, chills or abnormal night sweats (+) fatigue Eyes: Denies blurriness of vision, double vision or watery eyes (+) recent right eyelid surgery Ears, nose, mouth, throat, and face: Denies mucositis or sore throat Respiratory: Denies cough, dyspnea or wheezes  Cardiovascular: Denies palpitation or lower extremity swelling  Gastrointestinal:  Denies nausea, heartburn or change in bowel habits Skin: Denies abnormal skin rashes Lymphatics: Denies new lymphadenopathy or easy bruising Neurological:Denies numbness, tingling or new weaknesses Behavioral/Psych: Mood is stable, no new changes  All other systems were reviewed with the patient and are negative.  PHYSICAL EXAMINATION:  ECOG PERFORMANCE STATUS: 1 - Symptomatic but completely ambulatory Vitals: BP 127/78 Pulse 81 RR 16 GENERAL:alert, no distress and comfortable SKIN: skin color, texture, turgor are normal, no rashes or significant lesions EYES: normal, conjunctiva are pink and non-injected, sclera clear OROPHARYNX:no exudate, no erythema and lips, buccal mucosa, and tongue normal  NECK: supple, thyroid normal size, non-tender, without nodularity LYMPH:  no palpable lymphadenopathy in the cervical, axillary or inguinal LUNGS: clear to auscultation and percussion with normal breathing effort HEART: regular rate & rhythm and no murmurs and no lower extremity edema ABDOMEN:abdomen soft, non-tender and normal bowel sounds Musculoskeletal:no cyanosis of digits and no clubbing  PSYCH: alert & oriented x 3 with fluent speech NEURO: no focal motor/sensory deficits  LABORATORY DATA:  I have reviewed the data as listed CBC Latest Ref Rng & Units 08/04/2017 07/19/2017 06/22/2017  WBC 4.0 - 10.5 K/uL 7.9 7.8 9.1  Hemoglobin 12.0 - 15.0 g/dL 10.4(L) 10.2(L) 9.9(L)  Hematocrit 36.0 - 46.0 % 35.6(L) 33.3(L) 31.9(L)  Platelets 150 - 400 K/uL 520(H) 430(H) 481(H)    CMP Latest Ref Rng & Units 08/04/2017 06/22/2017  09/20/2016  Glucose 70 - 99 mg/dL 106(H) 101(H) 96  BUN 6 - 20 mg/dL 9 9 8   Creatinine 0.44 - 1.00 mg/dL 0.78 0.69 0.72  Sodium 135 - 145 mmol/L 139 139 138  Potassium 3.5 - 5.1 mmol/L 3.8 3.8 3.9  Chloride 98 -  111 mmol/L 107 104 100  CO2 22 - 32 mmol/L 23 25 29   Calcium 8.9 - 10.3 mg/dL 9.5 9.2 9.3  Total Protein 6.0 - 8.3 g/dL - - 7.3  Total Bilirubin 0.2 - 1.2 mg/dL - - 0.4  Alkaline Phos 39 - 117 U/L - - 65  AST 0 - 37 U/L - - 15  ALT 0 - 35 U/L - - 12   Results for MADESYN, AST (MRN [id number]) as of 09/02/2017 09:28  Ref. Range 04/13/2017 08:14 05/31/2017 08:07 07/19/2017 08:24 08/31/2017 15:36  Iron Latest Ref Range: 41 - 142 ug/dL 43 36 (L) 32 (L) 31 (L)  UIBC Latest Units: ug/dL 227 284 302 344  TIBC Latest Ref Range: 236 - 444 ug/dL 270 320 334 375  Saturation Ratios Latest Ref Range: 21 - 57 % 16 (L) 11 (L) 10 (L) 8 (L)  Ferritin Latest Ref Range: 11 - 307 ng/mL 248 149 131 111    RADIOGRAPHIC STUDIES: I have personally reviewed the radiological images as listed and agreed with the findings in the report. No results found.  ASSESSMENT No problem-specific Assessment & Plan notes found for this encounter.  This is a lovely 32 y.o. AA female who presents with anemia since teenager  1. Iron deficiency anemia, secondary to menorrhagia, sickle cell trait  -I have reviewed the patient's labs in great detail with her previously.  -Her CBC in September 2018 showed microcytic anemia with MCV 62.7, moderate anemia with hemoglobin 9.9, normal WBC, elevated platelet count at 445K. She had a history of iron deficient anemia, likely secondary to her menorrhagia. This is consistent with iron deficient anemia, which was confirmed on her lab work -She responded to IV iron, her hemoglobin improved to 10.9 after infusion.  Her fatigue did not improve. -Giving the very low MCV, anemia did not resolve after adequate iron replacement, I did hemoglobin electrophoresis to rule out thalassemia.   The test confirmed she has a sickle cell trait (Hgb S 31%), she also has slightly elevated Hgb A2 (4.4%) which could be related her sickle cell trait. No evidence of beta thalassemia.  -She has been taking oral iron ferrous sulfate once daily, tolerating well, however still has mild to moderate anemia, and significant fatigue. -Pt notes she tried various forms of birth control  In the past which did not help to control her menorrhagia.  She is scheduled to see her gynecologist next months to discuss management of her menorrhagia -Her endocrine work up with Dr. [name] and GI workup with Dr. Henrene Pastor was normal other than IBS being found and is now being treated for. -She has not has menses in 2 months now. Her Hg today is 10.8, WBC 11.1, platelets 475K. She said that she was put on Aygestin 5 mg by the OBGYN. -She previously had constipation with oral iron pills, but agrees to restart -We will continue lab monitoring every 8 weeks, will set up IV Feraheme if she has low ferritin (<150) or low serum iron level  -Labs every 2 months -F/u in 6 months   2.  Obesity, menorrhagia, irregular menstrual period -She will follow-up with her gynecologist  -I encouraged her to eat healthy and exercise, and try to lose weight.  -She is planning to participate [facility] weight loss program.  -I discussed the option of bariatric surgery and what that would include and how this would effect her anemia and fatigue. She is scared of surgery, does not plan to do.  -she had  D&C and her OBGYN put her on Aygestin 5 mg on 07/04/2017  3.  Pituitary adenoma and hyperprolactinemia  -She has seen endocrinologist Dr. [name], on dostinex, will continue f/u   4. IBS -She is being followed by GI Dr. Henrene Pastor who put her on Colestid  5. Fatigue  -This is likely multifactorial. Her fatigue did not improve after IV iron for her anemia. Anemia is much improved, I do not think her fatigue is mainly from her anemia. -I again strongly  encouraged her to exercise, and try to lose some weight.    PLAN:  -She received IV Ferriheme today, one dose for this time   -Labs 2 months X3, will set up IV Feraheme if ferritin less than 150 or low iron level -F/u in 6 months  All questions were answered. The patient knows to call the clinic with any problems, questions or concerns.   Dierdre Searles Dweik am acting as scribe for Dr. Truitt Merle.  I have reviewed the above documentation for accuracy and completeness, and I agree with the above.     Truitt Merle, MD 08/31/17

## 2017-09-01 ENCOUNTER — Inpatient Hospital Stay: Payer: 59

## 2017-09-01 ENCOUNTER — Inpatient Hospital Stay (HOSPITAL_BASED_OUTPATIENT_CLINIC_OR_DEPARTMENT_OTHER): Payer: 59 | Admitting: Hematology

## 2017-09-01 ENCOUNTER — Other Ambulatory Visit: Payer: 59

## 2017-09-01 VITALS — BP 120/72 | HR 78 | Temp 98.9°F | Resp 16

## 2017-09-01 DIAGNOSIS — D573 Sickle-cell trait: Secondary | ICD-10-CM

## 2017-09-01 DIAGNOSIS — D500 Iron deficiency anemia secondary to blood loss (chronic): Secondary | ICD-10-CM | POA: Diagnosis not present

## 2017-09-01 DIAGNOSIS — E669 Obesity, unspecified: Secondary | ICD-10-CM | POA: Diagnosis not present

## 2017-09-01 DIAGNOSIS — K589 Irritable bowel syndrome without diarrhea: Secondary | ICD-10-CM

## 2017-09-01 DIAGNOSIS — N920 Excessive and frequent menstruation with regular cycle: Secondary | ICD-10-CM | POA: Diagnosis not present

## 2017-09-01 DIAGNOSIS — R5383 Other fatigue: Secondary | ICD-10-CM

## 2017-09-01 LAB — IRON AND TIBC
Iron: 31 ug/dL — ABNORMAL LOW (ref 41–142)
Saturation Ratios: 8 % — ABNORMAL LOW (ref 21–57)
TIBC: 375 ug/dL (ref 236–444)
UIBC: 344 ug/dL

## 2017-09-01 LAB — FERRITIN: Ferritin: 111 ng/mL (ref 11–307)

## 2017-09-01 MED ORDER — SODIUM CHLORIDE 0.9 % IV SOLN
510.0000 mg | Freq: Once | INTRAVENOUS | Status: AC
  Administered 2017-09-01: 510 mg via INTRAVENOUS
  Filled 2017-09-01: qty 17

## 2017-09-01 MED ORDER — SODIUM CHLORIDE 0.9 % IV SOLN
INTRAVENOUS | Status: DC
  Administered 2017-09-01: 16:00:00 via INTRAVENOUS
  Filled 2017-09-01: qty 250

## 2017-09-01 MED ORDER — SODIUM CHLORIDE 0.9 % IV SOLN
750.0000 mL | Freq: Once | INTRAVENOUS | Status: DC
  Filled 2017-09-01: qty 750

## 2017-09-01 NOTE — Progress Notes (Signed)
Patient declined 30 minute post observation.  VS stable, discharged with no distress.

## 2017-09-01 NOTE — Patient Instructions (Signed)

## 2017-09-02 ENCOUNTER — Encounter: Payer: Self-pay | Admitting: Hematology

## 2017-09-02 ENCOUNTER — Ambulatory Visit: Payer: Self-pay | Admitting: Obstetrics and Gynecology

## 2017-09-02 ENCOUNTER — Ambulatory Visit: Payer: 59

## 2017-09-02 ENCOUNTER — Ambulatory Visit: Payer: 59 | Admitting: Hematology

## 2017-09-02 ENCOUNTER — Telehealth: Payer: Self-pay | Admitting: Hematology

## 2017-09-02 NOTE — Telephone Encounter (Signed)
Appts scheduled Patient notified per 8/29 los

## 2017-09-09 ENCOUNTER — Ambulatory Visit: Payer: Self-pay | Admitting: Obstetrics and Gynecology

## 2017-09-14 ENCOUNTER — Ambulatory Visit: Payer: 59 | Admitting: Obstetrics and Gynecology

## 2017-09-14 ENCOUNTER — Encounter: Payer: Self-pay | Admitting: Obstetrics and Gynecology

## 2017-09-14 ENCOUNTER — Telehealth: Payer: Self-pay | Admitting: Obstetrics and Gynecology

## 2017-09-14 ENCOUNTER — Ambulatory Visit: Payer: Self-pay | Admitting: Obstetrics and Gynecology

## 2017-09-14 NOTE — Progress Notes (Deleted)
GYNECOLOGY  VISIT   HPI: 32 y.o.   Single  African American  female   G0P0000 with No LMP recorded. (Menstrual status: Irregular Periods).   here for 3 month f/u     GYNECOLOGIC HISTORY: No LMP recorded. (Menstrual status: Irregular Periods). Contraception:  Condoms Menopausal hormone therapy:  n/a Last mammogram:  n/a Last pap smear:   04/28/17 Neg:Neg HR HPV        OB History    Gravida  0   Para  0   Term  0   Preterm  0   AB  0   Living  0     SAB  0   TAB  0   Ectopic  0   Multiple  0   Live Births  0              Patient Active Problem List   Diagnosis Date Noted  . Menorrhagia with irregular cycle 04/28/2017  . Cervical polyp 04/28/2017  . Morbid obesity ([place]) 04/28/2017  . Hyperprolactinemia ([place]) 02/11/2017  . Pituitary adenoma ([place]) 02/11/2017  . Sickle cell trait ([place]) 02/01/2017  . Iron deficiency anemia due to chronic blood loss 11/04/2016    Past Medical History:  Diagnosis Date  . Adenoma of pituitary ([place]) 08/2014  . Anemia   . IBS (irritable bowel syndrome)   . Seasonal allergies   . Sickle cell trait [facility])     Past Surgical History:  Procedure Laterality Date  . BLEPHAROPLASTY Right    lower lid x 3  . CHOLECYSTECTOMY  2016  . ELECTROLYSIS OF MISDIRECTED LASHES Right 08/04/2017   Procedure: ELECTROLYSIS OF MISDIRECTED LASHES RIGHT EYE;  Surgeon: Clista Bernhardt, MD;  Location: [facility];  Service: Ophthalmology;  Laterality: Right;  . ENTROPIAN REPAIR Right 08/04/2017   Procedure: REPAIR OF ENTROPION WITH TARSAL STRIP RIGHT EYE;  Surgeon: Clista Bernhardt, MD;  Location: [facility];  Service: Ophthalmology;  Laterality: Right;  . HYSTEROSCOPY W/D&C N/A 06/27/2017   Procedure: DILATATION & CURETTAGE/HYSTEROSCOPY;  Surgeon: Nunzio Cobbs, MD;  Location: [facility];  Service: Gynecology;  Laterality: N/A;  . iud placement     been removed    Current Outpatient Medications  Medication Sig Dispense Refill  . acetaminophen  (TYLENOL) 500 MG tablet Take 1 tablet (500 mg total) by mouth every 6 (six) hours as needed for mild pain or moderate pain. 30 tablet 0  . cabergoline (DOSTINEX) 0.5 MG tablet Take 0.5 tablets (0.25 mg total) by mouth once a week. (Patient taking differently: Take 0.25 mg by mouth 2 (two) times a week. ) 10 tablet 2  . cetirizine (ZYRTEC) 10 MG tablet Take 10 mg by mouth daily.     . Cholecalciferol (VITAMIN D) 2000 units CAPS Take 2,000 Units by mouth daily.     . colestipol (COLESTID) 1 g tablet Take 2 tablets (2 g total) by mouth 2 (two) times daily. (Patient taking differently: Take 1 g by mouth daily. ) 120 tablet 2  . ferrous sulfate 325 (65 FE) MG tablet Take 325 mg by mouth 3 (three) times daily with meals.     . fluticasone (FLONASE) 50 MCG/ACT nasal spray Place 2 sprays into both nostrils daily.    Marland Kitchen ibuprofen (ADVIL,MOTRIN) 800 MG tablet Take 1 tablet (800 mg total) by mouth every 8 (eight) hours as needed. 30 tablet 0  . Multiple Vitamin (MULTIVITAMIN WITH MINERALS) TABS tablet Take 1 tablet by mouth daily.    Marland Kitchen  norethindrone (AYGESTIN) 5 MG tablet Take one tablet, 5 mg, by mouth twice daily. (Patient taking differently: Take 10 mg by mouth daily. Take one tablet, 5 mg, by mouth twice daily.) 60 tablet 2  . Tetrahydrozoline HCl (VISINE OP) Place 1 drop into both eyes daily as needed (dry eyes).      No current facility-administered medications for this visit.      ALLERGIES: Food and Pollinex-t [modified tree tyrosine adsorbate]  Family History  Adopted: Yes  Problem Relation Age of Onset  . Sickle cell trait Mother   . Heart disease Father     Social History   Socioeconomic History  . Marital status: Single    Spouse name: Not on file  . Number of children: 0  . Years of education: Not on file  . Highest education level: Not on file  Occupational History  . Occupation: Engineer, maintenance (IT)  Social Needs  . Financial resource strain: Not on file  . Food insecurity:     Worry: Not on file    Inability: Not on file  . Transportation needs:    Medical: Not on file    Non-medical: Not on file  Tobacco Use  . Smoking status: Never Smoker  . Smokeless tobacco: Never Used  Substance and Sexual Activity  . Alcohol use: Yes    Comment: socially -1 per week  . Drug use: No  . Sexual activity: Yes    Birth control/protection: Condom  Lifestyle  . Physical activity:    Days per week: Not on file    Minutes per session: Not on file  . Stress: Not on file  Relationships  . Social connections:    Talks on phone: Not on file    Gets together: Not on file    Attends religious service: Not on file    Active member of club or organization: Not on file    Attends meetings of clubs or organizations: Not on file    Relationship status: Not on file  . Intimate partner violence:    Fear of current or ex partner: Not on file    Emotionally abused: Not on file    Physically abused: Not on file    Forced sexual activity: Not on file  Other Topics Concern  . Not on file  Social History Narrative  . Not on file    Review of Systems  PHYSICAL EXAMINATION:    There were no vitals taken for this visit.    General appearance: alert, cooperative and appears stated age Head: Normocephalic, without obvious abnormality, atraumatic Neck: no adenopathy, supple, symmetrical, trachea midline and thyroid normal to inspection and palpation Lungs: clear to auscultation bilaterally Breasts: normal appearance, no masses or tenderness, No nipple retraction or dimpling, No nipple discharge or bleeding, No axillary or supraclavicular adenopathy Heart: regular rate and rhythm Abdomen: soft, non-tender, no masses,  no organomegaly Extremities: extremities normal, atraumatic, no cyanosis or edema Skin: Skin color, texture, turgor normal. No rashes or lesions Lymph nodes: Cervical, supraclavicular, and axillary nodes normal. No abnormal inguinal nodes palpated Neurologic:  Grossly normal  Pelvic: External genitalia:  no lesions              Urethra:  normal appearing urethra with no masses, tenderness or lesions              Bartholins and Skenes: normal                 Vagina: normal appearing vagina  with normal color and discharge, no lesions              Cervix: no lesions                Bimanual Exam:  Uterus:  normal size, contour, position, consistency, mobility, non-tender              Adnexa: no mass, fullness, tenderness              Rectal exam: {yes no:[id number]}.  Confirms.              Anus:  normal sphincter tone, no lesions  Chaperone was present for exam.  ASSESSMENT     PLAN     An After Visit Summary was printed and given to the patient.  ______ minutes face to face time of which over 50% was spent in counseling.

## 2017-09-14 NOTE — Telephone Encounter (Signed)
Patient is requesting that her dose of Aygestin be lowered. Patient reschedule today office visit for 9/20, but is requesting the dosage be lowered in the mean time.

## 2017-09-14 NOTE — Telephone Encounter (Signed)
Patient canceled her 3 month recheck appointment today. Patient states she will need to call later to reschedule. [facility] policy followed.

## 2017-09-14 NOTE — Telephone Encounter (Signed)
Patient call back and decided to keep appointment today.

## 2017-09-15 ENCOUNTER — Other Ambulatory Visit: Payer: Self-pay | Admitting: Obstetrics and Gynecology

## 2017-09-15 NOTE — Telephone Encounter (Signed)
Ok for Aygestin 5 mg  Sig:  Take one tab (5mg ) by mouth twice daily.  Disp:  60. RF:  zero.

## 2017-09-15 NOTE — Telephone Encounter (Signed)
Patient is asking for a refill of aygestin to the pharmacy on file. Patient also said to disregard the telephone message from yesterday about this prescription.

## 2017-09-15 NOTE — Telephone Encounter (Signed)
Medication refill request: Aygestin Last AEX:  04/28/17 BS Next AEX: not scheduled Next OV: 10/07/17 for 69month f/u -- patient rescheduled from 09/14/17 Last MMG (if hormonal medication request): n/a Refill authorized: 07/04/17 #60 w/2 refills; today please advise

## 2017-09-16 MED ORDER — NORETHINDRONE ACETATE 5 MG PO TABS: ORAL_TABLET | ORAL | 0 refills | Status: DC

## 2017-09-16 NOTE — Telephone Encounter (Signed)
Aygestin sent to pharmacy.

## 2017-09-23 ENCOUNTER — Ambulatory Visit: Payer: 59 | Admitting: Obstetrics and Gynecology

## 2017-10-06 NOTE — Progress Notes (Signed)
GYNECOLOGY  VISIT   HPI: 32 y.o.   Single  African American  female   G0P0000 with No LMP recorded. (Menstrual status: Irregular Periods).   here for  3 month recheck.   Final pathology from hysteroscopy 06/27/17 showed benign endometrial polyp.  Last time she had vaginal bleeding was July, 2019. On Aygestin 5 mg po bid following hysteroscopy. Please that she is no longer having abnormal uterine bleeding.   Feels a little bit on edge, tearful, bloating.  No breast tenderness.  Having some vaginal odor.   Did a home UPT a couple of weeks ago, and it was negative.   Using condoms for pregnancy prevention.  GYNECOLOGIC HISTORY: No LMP recorded. (Menstrual status: Irregular Periods). Contraception:  Condoms Menopausal hormone therapy:  agestin Last mammogram:  n/a Last pap smear:   04-28-17 negative, HR HPV negative         OB History    Gravida  0   Para  0   Term  0   Preterm  0   AB  0   Living  0     SAB  0   TAB  0   Ectopic  0   Multiple  0   Live Births  0              Patient Active Problem List   Diagnosis Date Noted  . Menorrhagia with irregular cycle 04/28/2017  . Cervical polyp 04/28/2017  . Morbid obesity ([place]) 04/28/2017  . Hyperprolactinemia ([place]) 02/11/2017  . Pituitary adenoma ([place]) 02/11/2017  . Sickle cell trait ([place]) 02/01/2017  . Iron deficiency anemia due to chronic blood loss 11/04/2016    Past Medical History:  Diagnosis Date  . Adenoma of pituitary ([place]) 08/2014  . Anemia   . IBS (irritable bowel syndrome)   . Seasonal allergies   . Sickle cell trait [facility])     Past Surgical History:  Procedure Laterality Date  . BLEPHAROPLASTY Right    lower lid x 3  . CHOLECYSTECTOMY  2016  . ELECTROLYSIS OF MISDIRECTED LASHES Right 08/04/2017   Procedure: ELECTROLYSIS OF MISDIRECTED LASHES RIGHT EYE;  Surgeon: Clista Bernhardt, MD;  Location: [place];  Service: Ophthalmology;  Laterality: Right;  . ENTROPIAN REPAIR Right 08/04/2017    Procedure: REPAIR OF ENTROPION WITH TARSAL STRIP RIGHT EYE;  Surgeon: Clista Bernhardt, MD;  Location: [place];  Service: Ophthalmology;  Laterality: Right;  . HYSTEROSCOPY W/D&C N/A 06/27/2017   Procedure: DILATATION & CURETTAGE/HYSTEROSCOPY;  Surgeon: Nunzio Cobbs, MD;  Location: [place] [facility];  Service: Gynecology;  Laterality: N/A;  . iud placement     been removed    Current Outpatient Medications  Medication Sig Dispense Refill  . acetaminophen (TYLENOL) 500 MG tablet Take 1 tablet (500 mg total) by mouth every 6 (six) hours as needed for mild pain or moderate pain. 30 tablet 0  . cabergoline (DOSTINEX) 0.5 MG tablet Take 0.5 tablets (0.25 mg total) by mouth once a week. (Patient taking differently: Take 0.25 mg by mouth 2 (two) times a week. ) 10 tablet 2  . cetirizine (ZYRTEC) 10 MG tablet Take 10 mg by mouth daily.     . Cholecalciferol (VITAMIN D) 2000 units CAPS Take 2,000 Units by mouth daily.     . colestipol (COLESTID) 1 g tablet Take 2 tablets (2 g total) by mouth 2 (two) times daily. (Patient taking differently: Take 1 g by mouth daily. ) 120 tablet 2  .  ferrous sulfate 325 (65 FE) MG tablet Take 325 mg by mouth 3 (three) times daily with meals.     . fluticasone (FLONASE) 50 MCG/ACT nasal spray Place 2 sprays into both nostrils daily.    Marland Kitchen ibuprofen (ADVIL,MOTRIN) 800 MG tablet Take 1 tablet (800 mg total) by mouth every 8 (eight) hours as needed. 30 tablet 0  . Multiple Vitamin (MULTIVITAMIN WITH MINERALS) TABS tablet Take 1 tablet by mouth daily.    . norethindrone (AYGESTIN) 5 MG tablet Take one tablet, 5 mg, by mouth twice daily. 60 tablet 0  . Tetrahydrozoline HCl (VISINE OP) Place 1 drop into both eyes daily as needed (dry eyes).      No current facility-administered medications for this visit.      ALLERGIES: Food and Pollinex-t [modified tree tyrosine adsorbate]  Family History  Adopted: Yes  Problem Relation Age of Onset  . Sickle cell trait Mother   .  Heart disease Father     Social History   Socioeconomic History  . Marital status: Single    Spouse name: Not on file  . Number of children: 0  . Years of education: Not on file  . Highest education level: Not on file  Occupational History  . Occupation: Engineer, maintenance (IT)  Social Needs  . Financial resource strain: Not on file  . Food insecurity:    Worry: Not on file    Inability: Not on file  . Transportation needs:    Medical: Not on file    Non-medical: Not on file  Tobacco Use  . Smoking status: Never Smoker  . Smokeless tobacco: Never Used  Substance and Sexual Activity  . Alcohol use: Yes    Comment: socially -1 per week  . Drug use: No  . Sexual activity: Yes    Birth control/protection: Condom  Lifestyle  . Physical activity:    Days per week: Not on file    Minutes per session: Not on file  . Stress: Not on file  Relationships  . Social connections:    Talks on phone: Not on file    Gets together: Not on file    Attends religious service: Not on file    Active member of club or organization: Not on file    Attends meetings of clubs or organizations: Not on file    Relationship status: Not on file  . Intimate partner violence:    Fear of current or ex partner: Not on file    Emotionally abused: Not on file    Physically abused: Not on file    Forced sexual activity: Not on file  Other Topics Concern  . Not on file  Social History Narrative  . Not on file    Review of Systems  Constitutional: Negative.   HENT: Negative.   Eyes: Negative.   Respiratory: Negative.   Cardiovascular: Negative.   Gastrointestinal: Negative.   Endocrine: Negative.   Genitourinary: Negative.   Musculoskeletal: Negative.   Skin: Negative.   Allergic/Immunologic: Negative.   Neurological: Negative.   Hematological: Negative.   Psychiatric/Behavioral: Negative.   All other systems reviewed and are negative.   PHYSICAL EXAMINATION:    BP (!) 142/82   Pulse  82   Wt (!) 343 lb (155.6 kg)   SpO2 98%   BMI 53.72 kg/m     General appearance: alert, cooperative and appears stated age   Pelvic: External genitalia:  no lesions  Urethra:  normal appearing urethra with no masses, tenderness or lesions              Bartholins and Skenes: normal                 Vagina: normal appearing vagina with normal color and discharge, no lesions              Cervix: no lesions                Bimanual Exam:  Uterus:  normal size, contour, position, consistency, mobility, non-tender              Adnexa: no mass, fullness, tenderness       Chaperone was present for exam.  ASSESSMENT  Vaginal odor.  Aygestin monitoring.  On Aygestin 5 mg po bid.  Pituitary adenoma on Dostinex.  PLAN  Affirm testing.  Tx for vaginitis to follow. Reduce Aygesting to 7.5 mg daily. FU for annual exam in April 2020 and prn.   An After Visit Summary was printed and given to the patient.  __15____ minutes face to face time of which over 50% was spent in counseling.

## 2017-10-07 ENCOUNTER — Other Ambulatory Visit: Payer: Self-pay

## 2017-10-07 ENCOUNTER — Encounter: Payer: Self-pay | Admitting: Obstetrics and Gynecology

## 2017-10-07 ENCOUNTER — Ambulatory Visit (INDEPENDENT_AMBULATORY_CARE_PROVIDER_SITE_OTHER): Payer: 59 | Admitting: Obstetrics and Gynecology

## 2017-10-07 VITALS — BP 142/82 | HR 82 | Wt 343.0 lb

## 2017-10-07 DIAGNOSIS — Z5181 Encounter for therapeutic drug level monitoring: Secondary | ICD-10-CM

## 2017-10-07 DIAGNOSIS — N898 Other specified noninflammatory disorders of vagina: Secondary | ICD-10-CM | POA: Diagnosis not present

## 2017-10-07 MED ORDER — NORETHINDRONE ACETATE 5 MG PO TABS: ORAL_TABLET | ORAL | 5 refills | Status: DC

## 2017-10-09 LAB — VAGINITIS/VAGINOSIS, DNA PROBE
Candida Species: NEGATIVE
Gardnerella vaginalis: NEGATIVE
Trichomonas vaginosis: NEGATIVE

## 2017-11-02 ENCOUNTER — Inpatient Hospital Stay: Payer: 59 | Attending: Hematology

## 2017-11-22 ENCOUNTER — Other Ambulatory Visit: Payer: Self-pay | Admitting: Obstetrics and Gynecology

## 2017-11-22 ENCOUNTER — Other Ambulatory Visit: Payer: Self-pay | Admitting: Internal Medicine

## 2017-11-22 ENCOUNTER — Telehealth: Payer: Self-pay | Admitting: Internal Medicine

## 2017-11-22 NOTE — Telephone Encounter (Signed)
Patient calling for refill on aygestin. States she is losing insurance coverage in a couple of weeks and would like the largest amount possible to last her.

## 2017-11-22 NOTE — Telephone Encounter (Signed)
Tried to call patient back to inform her that she has refills at the pharmacy and that her insurance will not allow [place] to call any more in they will not cover the medication. She can see if her insurance will allow her to pick up the remainder of her refills at once.

## 2017-11-22 NOTE — Telephone Encounter (Signed)
Spoke with patient and advised her as below.

## 2017-11-22 NOTE — Telephone Encounter (Signed)
Pt needs rf for colestipol sent to [place]. Pt stated that med is working very well.

## 2017-11-23 MED ORDER — COLESTIPOL HCL 1 G PO TABS: 2.0000 g | ORAL_TABLET | Freq: Two times a day (BID) | ORAL | 2 refills | Status: DC

## 2017-11-23 NOTE — Telephone Encounter (Signed)
Refilled Colestipol - patient needs office visit for further refills!!!!

## 2017-11-28 ENCOUNTER — Encounter: Payer: Self-pay | Admitting: Family Medicine

## 2017-11-28 ENCOUNTER — Ambulatory Visit: Payer: 59 | Admitting: Family Medicine

## 2017-11-28 VITALS — BP 108/78 | HR 82 | Temp 98.9°F | Wt 327.0 lb

## 2017-11-28 DIAGNOSIS — Z131 Encounter for screening for diabetes mellitus: Secondary | ICD-10-CM

## 2017-11-28 DIAGNOSIS — D500 Iron deficiency anemia secondary to blood loss (chronic): Secondary | ICD-10-CM

## 2017-11-28 DIAGNOSIS — E559 Vitamin D deficiency, unspecified: Secondary | ICD-10-CM

## 2017-11-28 DIAGNOSIS — N926 Irregular menstruation, unspecified: Secondary | ICD-10-CM

## 2017-11-28 LAB — POCT URINE PREGNANCY: Preg Test, Ur: NEGATIVE

## 2017-11-28 NOTE — Progress Notes (Signed)
Subjective:    Patient ID: Ana Padilla, female    DOB: 04-Jun-1985, 32 y.o.   MRN: [id number]  No chief complaint on file.   HPI Patient was seen today for f/u.  Pt states she is doing well overall.  She has been eating better and exercising which has resulted in weight loss.  Pt hoping to continue losing weight.  Pt with h/o AUB, on Aygestin 5 mg, would like pregnancy test.  Using condoms the majority of the time.  Pt taking vitamin D 2000 IU daily, but starting to feel like she did when her vit D was low.  Also expresses concerns about having DM as members of her family have it.  Pt changing jobs.  Will no longer be working for [employer].  Is working part time at [employer].  Thinking about going back to school for nursing.  Past Medical History:  Diagnosis Date  . 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 Anemia   . IBS (irritable bowel syndrome)   . Seasonal allergies   . Sickle cell trait ([facility])     Allergies  Allergen Reactions  . Food Other (See Comments)    Seasonal Allergies & fresh fruit---itchy mouth&nose   . Pollinex-T [Modified Tree Tyrosine Adsorbate]     Itchy nose and eyes, ears, runny nose,     ROS General: Denies fever, chills, night sweats, changes in weight, changes in appetite  +fatigue HEENT: Denies headaches, ear pain, changes in vision, rhinorrhea, sore throat CV: Denies CP, palpitations, SOB, orthopnea Pulm: Denies SOB, cough, wheezing GI: Denies abdominal pain, nausea, vomiting, diarrhea, constipation    GU: Denies dysuria, hematuria, frequency, vaginal discharge  +pregnancy concern Msk: Denies muscle cramps, joint pains Neuro: Denies weakness, numbness, tingling Skin: Denies rashes, bruising Psych: Denies depression, anxiety, hallucinations    Objective:    Blood pressure 108/78, pulse 82, temperature 98.9 F (37.2 C), temperature source Oral, weight (!) 327 lb (148.3 kg), SpO2 97 %.  Gen. Pleasant, well-nourished, in no distress, normal affect   HEENT:  [place]/AT, face symmetric, no scleral icterus, PERRLA, nares patent without drainage Lungs: no accessory muscle use, CTAB, no wheezes or rales Cardiovascular: RRR, no m/r/g, no peripheral edema Neuro:  A&Ox3, CN II-XII intact, normal gait  Wt Readings from Last 3 Encounters:  11/28/17 (!) 327 lb (148.3 kg)  10/07/17 (!) 343 lb (155.6 kg)  08/04/17 (!) 340 lb (154.2 kg)    Lab Results  Component Value Date   WBC 11.1 (H) 08/31/2017   HGB 10.8 (L) 08/31/2017   HCT 34.6 (L) 08/31/2017   PLT 475 (H) 08/31/2017   GLUCOSE 106 (H) 08/04/2017   CHOL 198 09/20/2016   TRIG 134.0 09/20/2016   HDL 79.40 09/20/2016   LDLCALC 92 09/20/2016   ALT 12 09/20/2016   AST 15 09/20/2016   NA 139 08/04/2017   K 3.8 08/04/2017   CL 107 08/04/2017   CREATININE 0.78 08/04/2017   BUN 9 08/04/2017   CO2 23 08/04/2017   TSH 1.19 09/20/2016   INR 0.92 08/04/2017   HGBA1C 5.6 09/20/2016    Assessment/Plan:  Iron deficiency anemia due to chronic blood loss  -continue ferrous sulfate 325 mg - Plan: CBC (no diff), Ferritin, Folate, Iron, TIBC and Ferritin Panel  Vitamin D deficiency  -Continue cholecalciferol 2000 IUs daily - Plan: Vitamin D, 25-hydroxy ---Vit D returned low at 21.9.  Will send in rx for ergocalciferol 50, 000 IU  Screening for diabetes mellitus  -Given risk factors including  family history screen for DM - Plan: Hemoglobin A1c  5.4%  Missed menses  - Plan: POCT urine pregnancy negative  Follow-up PRN in 3 to 4 months  Abbe Amsterdam, MD

## 2017-11-29 ENCOUNTER — Encounter: Payer: Self-pay | Admitting: Family Medicine

## 2017-11-29 LAB — FOLATE: Folate: 22.6 ng/mL (ref >5.9)

## 2017-11-29 LAB — CBC
HCT: 36.7 % (ref 36.0–46.0)
Hemoglobin: 12 g/dL (ref 12.0–15.0)
MCHC: 32.7 g/dL (ref 30.0–36.0)
MCV: 68.5 fl — ABNORMAL LOW (ref 78.0–100.0)
Platelets: 538 10*3/uL — ABNORMAL HIGH (ref 150.0–400.0)
RBC: 5.36 Mil/uL — ABNORMAL HIGH (ref 3.87–5.11)
RDW: 17.7 % — ABNORMAL HIGH (ref 11.5–15.5)
WBC: 8.3 10*3/uL (ref 4.0–10.5)

## 2017-11-29 LAB — IRON,TIBC AND FERRITIN PANEL
%SAT: 19 % (calc) (ref 16–45)
Ferritin: 204 ng/mL — ABNORMAL HIGH (ref 16–154)
Iron: 65 ug/dL (ref 40–190)
TIBC: 337 mcg/dL (calc) (ref 250–450)

## 2017-11-29 LAB — FERRITIN: Ferritin: 160.1 ng/mL (ref 10.0–291.0)

## 2017-11-29 LAB — HEMOGLOBIN A1C: Hgb A1c MFr Bld: 5.4 % (ref 4.6–6.5)

## 2017-11-29 LAB — VITAMIN D 25 HYDROXY (VIT D DEFICIENCY, FRACTURES): VITD: 21.73 ng/mL — ABNORMAL LOW (ref 30.00–100.00)

## 2017-11-29 MED ORDER — VITAMIN D (ERGOCALCIFEROL) 1.25 MG (50000 UNIT) PO CAPS: 50000.0000 [IU] | ORAL_CAPSULE | ORAL | 0 refills | Status: DC

## 2017-12-06 ENCOUNTER — Telehealth: Payer: Self-pay | Admitting: Family Medicine

## 2017-12-06 NOTE — Telephone Encounter (Signed)
Copied from [place] [id number]. Topic: Quick Communication - Lab Results (Clinic Use ONLY) >> Dec 05, 2017  3:34 PM [place], Andee Poles, CMA wrote: Called patient to inform them of  lab results. When patient returns call, triage nurse may disclose results.

## 2018-01-02 ENCOUNTER — Inpatient Hospital Stay: Payer: 59 | Attending: Hematology

## 2018-01-30 ENCOUNTER — Telehealth: Payer: Self-pay | Admitting: Obstetrics and Gynecology

## 2018-01-30 MED ORDER — NORETHINDRONE ACETATE 5 MG PO TABS: 10.0000 mg | ORAL_TABLET | Freq: Every day | ORAL | 3 refills | Status: DC

## 2018-01-30 NOTE — Telephone Encounter (Signed)
Order placed for Aygestin 10 mg po daily x 3 months.  Has annual exam scheduled for 05/10/2018.

## 2018-01-30 NOTE — Telephone Encounter (Signed)
Return call to patient.   C/o vaginal bleeding and or spotting spotting with intermittent pelvic cramping since Tuesday 01/24/2018.    She states about three weeks ago, she increased her Aygestin to 10 mg daily despite lowering from Dr. Quincy Simmonds to 7.5 mg daily to help with irregular spotting.   Since she has increased her dose, now needs refill of Aygestin. Would like  to stay on 10 mg Aygestin po daily as she states this stops the irregular bleeding.  No missed or late pills.  Took a home pregnancy test this morning and it was negative.   Using tylenol and or motrin for cramps, helps sometimes.   Not bleeding today, not wearing a pad or tampon.   Declines office visit, does not currently have insurance coverage. She will have coverage in a few weeks so she will check on that today.   Advised patient will send message to Dr. Quincy Simmonds.  Confirm order for Aygestin and refill with dose per Dr. Quincy Simmonds as patient requests to increase her dose back from 7.5 to 10 mg daily.   Precautions given to go to nearest Emergency Room if develops vaginal bleeding, increased pain or soaking pad or tampon two pads in two hours.  Patient verbalizes understanding of emergency symptoms and will call back with any concerns.

## 2018-01-30 NOTE — Telephone Encounter (Signed)
Ok for Aygestin 10 mg daily for 3 months, until her annual exam is due.

## 2018-01-30 NOTE — Telephone Encounter (Signed)
Patient is cramping and having break through bleeding.

## 2018-01-30 NOTE — Telephone Encounter (Signed)
Patient returned call.  Notified of message from Dr. Quincy Simmonds.  Call back if cycles not controlled or pain continues.  Patient agreeable.  Will call back if any concerns for appointment with Dr. Quincy Simmonds.   Encounter closed.

## 2018-02-24 ENCOUNTER — Telehealth: Payer: Self-pay | Admitting: Hematology

## 2018-02-24 NOTE — Telephone Encounter (Signed)
Called patient per VM scheduling log.  Cancelled appt per patient request.

## 2018-03-03 ENCOUNTER — Ambulatory Visit: Payer: 59 | Admitting: Hematology

## 2018-03-03 ENCOUNTER — Other Ambulatory Visit: Payer: 59

## 2018-03-06 ENCOUNTER — Telehealth: Payer: Self-pay | Admitting: Obstetrics and Gynecology

## 2018-03-06 NOTE — Telephone Encounter (Signed)
Called [facility] and left a message.  They open at 10 am.  The patient should have a refill available.

## 2018-03-06 NOTE — Telephone Encounter (Signed)
Message left to return call to [place] at [phone].  Called [facility]. They have processed a refill for her.

## 2018-03-06 NOTE — Telephone Encounter (Signed)
Patient is calling regarding a refill request. Patient stated that she is currently taking 10MG  Norethindrone.

## 2018-03-07 NOTE — Telephone Encounter (Signed)
Pt returned call. Advised refills are available at [facility] and patient is aware, they had to order it for her.   She states bleeding is under better control. Wants Dr. Quincy Simmonds to know she is having insurance coverage issues and cannot come back until May.  Has annual exam 05/10/2018. Will call back if any concerns prior to annual exam.  Encounter to Dr. Quincy Simmonds and will close.

## 2018-04-25 ENCOUNTER — Telehealth: Payer: Self-pay | Admitting: *Deleted

## 2018-04-25 ENCOUNTER — Telehealth: Payer: Self-pay | Admitting: Obstetrics and Gynecology

## 2018-04-25 NOTE — Telephone Encounter (Signed)
Spoke with patient. Requesting to discuss with Dr. Quincy Simmonds coming off of aygestin or alternatives for family planning. Is concerned about side effects of aygestin long term such as depression. States she has discussed this in the past with Dr. Quincy Simmonds.  Denies any bleeding or GYN symptoms. Has been working on weight loss. Recommended WebEx visit with Dr. Quincy Simmonds. Patient states she does not have insurance until May 05, 2018, but does not want to wait for visit, request to schedule WebEx. WebEx visit scheduled for 4/23 at 1130 with Dr. Quincy Simmonds.   Routing to provider for final review. Patient is agreeable to disposition. Will close encounter.

## 2018-04-25 NOTE — Telephone Encounter (Signed)
Patient would like to speak with nurse about a medication issue.

## 2018-04-25 NOTE — Telephone Encounter (Signed)
Left message to call Sharee Pimple, RN at [facility].    Reschedule WebEx scheduled for 04/27/18

## 2018-04-26 NOTE — Telephone Encounter (Signed)
Spoke with patient. WebEx visit rescheduled to 4/24 at 1pm with Dr. Quincy Simmonds. Patient verbalizes understanding and is agreeable.   Encounter closed.

## 2018-04-27 ENCOUNTER — Ambulatory Visit: Payer: Self-pay | Admitting: Obstetrics and Gynecology

## 2018-04-27 ENCOUNTER — Telehealth: Payer: Self-pay | Admitting: Obstetrics and Gynecology

## 2018-04-27 NOTE — Telephone Encounter (Signed)
Spoke with patient, requesting to reschedule WebEx to earlier time on 4/24 due to change in her work schedule. WebEx OV scheduled for 4/24 at 10am with Dr. Quincy Simmonds.   Routing to provider for final review. Patient is agreeable to disposition. Will close encounter.

## 2018-04-27 NOTE — Telephone Encounter (Signed)
Patient would like to reschedule her WebEx appointment.

## 2018-04-28 ENCOUNTER — Encounter: Payer: Self-pay | Admitting: Obstetrics and Gynecology

## 2018-04-28 ENCOUNTER — Ambulatory Visit: Payer: Self-pay | Admitting: Obstetrics and Gynecology

## 2018-04-28 ENCOUNTER — Ambulatory Visit (INDEPENDENT_AMBULATORY_CARE_PROVIDER_SITE_OTHER): Payer: Self-pay | Admitting: Obstetrics and Gynecology

## 2018-04-28 ENCOUNTER — Other Ambulatory Visit: Payer: Self-pay

## 2018-04-28 DIAGNOSIS — Z8742 Personal history of other diseases of the female genital tract: Secondary | ICD-10-CM

## 2018-04-28 DIAGNOSIS — Z3169 Encounter for other general counseling and advice on procreation: Secondary | ICD-10-CM

## 2018-04-28 DIAGNOSIS — E221 Hyperprolactinemia: Secondary | ICD-10-CM

## 2018-04-28 MED ORDER — NORETHINDRONE ACETATE 5 MG PO TABS: 5.0000 mg | ORAL_TABLET | Freq: Every day | ORAL | 2 refills | Status: DC

## 2018-04-28 NOTE — Progress Notes (Signed)
GYNECOLOGY  VISIT   HPI: 33 y.o.   Single  African American  female   G0P0000 with No LMP recorded. (Menstrual status: Irregular Periods).   here for phone visit.  Unable to do Webex due to technology issues on her part.   Gives permission for the visit.  I called her on the phone as she had not signed in on Webex and did not know how ot do so. Call started at 10:18.  Ended at 10:40.  She is at home and I am at the office.   Plan to wean off Aygestin and would like to do family planning.  Having depression on the Aygestin.   Not having bleeding issues.  The last time she had any bleeding was 2 - 3 months ago.  She started taking Aygestin 7.5 mg daily in January for 2 weeks and then the bleeding started and it was light.  She went back up to 5 mg twice a day.   Not on Dostinex for 5 months.  She usually sees Dr. [name].   She is now working at [employer] in [employer] [address] and going to school.   GYNECOLOGIC HISTORY: No LMP recorded. (Menstrual status: Irregular Periods). Contraception:  NA.  Menopausal hormone therapy:  NA.  Last mammogram:  NA. Last pap smear:   04/28/17 - neg, HR HPV neg        OB History    Gravida  0   Para  0   Term  0   Preterm  0   AB  0   Living  0     SAB  0   TAB  0   Ectopic  0   Multiple  0   Live Births  0              Patient Active Problem List   Diagnosis Date Noted  . Cervical polyp 04/28/2017  . Morbid obesity ([place]) 04/28/2017  . Hyperprolactinemia ([place]) 02/11/2017  . Pituitary adenoma ([place]) 02/11/2017  . Sickle cell trait ([place]) 02/01/2017  . Iron deficiency anemia due to chronic blood loss 11/04/2016    Past Medical History:  Diagnosis Date  . Adenoma of pituitary ([place]) 08/2014  . Anemia   . IBS (irritable bowel syndrome)   . Seasonal allergies   . Sickle cell trait [facility])     Past Surgical History:  Procedure Laterality Date  . BLEPHAROPLASTY Right    lower lid x 3  . CHOLECYSTECTOMY  2016  .  ELECTROLYSIS OF MISDIRECTED LASHES Right 08/04/2017   Procedure: ELECTROLYSIS OF MISDIRECTED LASHES RIGHT EYE;  Surgeon: Clista Bernhardt, MD;  Location: [place];  Service: Ophthalmology;  Laterality: Right;  . ENTROPIAN REPAIR Right 08/04/2017   Procedure: REPAIR OF ENTROPION WITH TARSAL STRIP RIGHT EYE;  Surgeon: Clista Bernhardt, MD;  Location: [place];  Service: Ophthalmology;  Laterality: Right;  . HYSTEROSCOPY W/D&C N/A 06/27/2017   Procedure: DILATATION & CURETTAGE/HYSTEROSCOPY;  Surgeon: Nunzio Cobbs, MD;  Location: [facility];  Service: Gynecology;  Laterality: N/A;  . iud placement     been removed    Current Outpatient Medications  Medication Sig Dispense Refill  . acetaminophen (TYLENOL) 500 MG tablet Take 1 tablet (500 mg total) by mouth every 6 (six) hours as needed for mild pain or moderate pain. 30 tablet 0  . cabergoline (DOSTINEX) 0.5 MG tablet Take 0.5 tablets (0.25 mg total) by mouth once a week. (Patient taking differently: Take 0.25 mg  by mouth 2 (two) times a week. ) 10 tablet 2  . cetirizine (ZYRTEC) 10 MG tablet Take 10 mg by mouth daily.     . Cholecalciferol (VITAMIN D) 2000 units CAPS Take 2,000 Units by mouth daily.     . colestipol (COLESTID) 1 g tablet Take 2 tablets (2 g total) by mouth 2 (two) times daily. Patient needs office visit for further refills!!! 120 tablet 2  . ferrous sulfate 325 (65 FE) MG tablet Take 325 mg by mouth 3 (three) times daily with meals.     . fluticasone (FLONASE) 50 MCG/ACT nasal spray Place 2 sprays into both nostrils daily.    Marland Kitchen ibuprofen (ADVIL,MOTRIN) 800 MG tablet Take 1 tablet (800 mg total) by mouth every 8 (eight) hours as needed. 30 tablet 0  . Multiple Vitamin (MULTIVITAMIN WITH MINERALS) TABS tablet Take 1 tablet by mouth daily.    . norethindrone (AYGESTIN) 5 MG tablet Take 2 tablets (10 mg total) by mouth daily. 60 tablet 3  . Tetrahydrozoline HCl (VISINE OP) Place 1 drop into both eyes daily as needed (dry eyes).      . Vitamin D, Ergocalciferol, (DRISDOL) 1.25 MG (50000 UT) CAPS capsule Take 1 capsule (50,000 Units total) by mouth every 7 (seven) days. 12 capsule 0   No current facility-administered medications for this visit.      ALLERGIES: Food and Pollinex-t [modified tree tyrosine adsorbate]  Family History  Adopted: Yes  Problem Relation Age of Onset  . Sickle cell trait Mother   . Heart disease Father     Social History   Socioeconomic History  . Marital status: Single    Spouse name: Not on file  . Number of children: 0  . Years of education: Not on file  . Highest education level: Not on file  Occupational History  . Occupation: Engineer, maintenance (IT)  Social Needs  . Financial resource strain: Not on file  . Food insecurity:    Worry: Not on file    Inability: Not on file  . Transportation needs:    Medical: Not on file    Non-medical: Not on file  Tobacco Use  . Smoking status: Never Smoker  . Smokeless tobacco: Never Used  Substance and Sexual Activity  . Alcohol use: Yes    Comment: socially -1 per week  . Drug use: No  . Sexual activity: Yes    Birth control/protection: Condom  Lifestyle  . Physical activity:    Days per week: Not on file    Minutes per session: Not on file  . Stress: Not on file  Relationships  . Social connections:    Talks on phone: Not on file    Gets together: Not on file    Attends religious service: Not on file    Active member of club or organization: Not on file    Attends meetings of clubs or organizations: Not on file    Relationship status: Not on file  . Intimate partner violence:    Fear of current or ex partner: Not on file    Emotionally abused: Not on file    Physically abused: Not on file    Forced sexual activity: Not on file  Other Topics Concern  . Not on file  Social History Narrative  . Not on file    Review of Systems  PHYSICAL EXAMINATION:    There were no vitals taken for this visit.    General  appearance: alert, cooperative and  appears stated age   Chaperone was present for exam.  ASSESSMENT  Hyperprolactinemia.  Hx abnormal uterine bleeding.  On Aygestin.  Preconception counseling.   PLAN  Lower Aygestin to 5 mg po bid x 2 months, and then go to Aygestin 2.5 mg daily for one month.  Then ok to stop Aygestin and observe cycles.  She is on PNV.  We discussed avoiding exposures.  She will contact Dr. [name] for an appointment.  She understands the importance of normalizing her prolactin level for her being able to conceive. Check Rubella titer and prolactin level.  She will make a lab appointment.  Annual exam in June or July 2020.    An After Visit Summary was printed and given to the patient.  ___22__ minutes phone consultation.

## 2018-05-01 ENCOUNTER — Telehealth: Payer: Self-pay | Admitting: Obstetrics and Gynecology

## 2018-05-01 ENCOUNTER — Other Ambulatory Visit: Payer: Self-pay | Admitting: Obstetrics and Gynecology

## 2018-05-01 DIAGNOSIS — D509 Iron deficiency anemia, unspecified: Secondary | ICD-10-CM

## 2018-05-01 NOTE — Telephone Encounter (Signed)
Dr.Silva, okay to add on Iron studies with patient's future lab orders for rubella and prolactin?

## 2018-05-01 NOTE — Telephone Encounter (Signed)
I will add a CBC, iron and ferritin.

## 2018-05-01 NOTE — Telephone Encounter (Signed)
Spoke with patient. Appointment scheduled for tomorrow at 10:00 am for labs. Patient is agreeable to date and time. Encounter closed.

## 2018-05-01 NOTE — Telephone Encounter (Signed)
Patient would like to add an order to have her iron tested along with the other labs dr Quincy Simmonds ordered on Friday and a time to come in.

## 2018-05-02 ENCOUNTER — Other Ambulatory Visit (INDEPENDENT_AMBULATORY_CARE_PROVIDER_SITE_OTHER): Payer: Self-pay

## 2018-05-02 ENCOUNTER — Other Ambulatory Visit: Payer: Self-pay

## 2018-05-02 DIAGNOSIS — Z3169 Encounter for other general counseling and advice on procreation: Secondary | ICD-10-CM

## 2018-05-02 DIAGNOSIS — E221 Hyperprolactinemia: Secondary | ICD-10-CM

## 2018-05-02 DIAGNOSIS — D509 Iron deficiency anemia, unspecified: Secondary | ICD-10-CM

## 2018-05-03 LAB — CBC
Hematocrit: 37.5 % (ref 34.0–46.6)
Hemoglobin: 11.9 g/dL (ref 11.1–15.9)
MCH: 23.6 pg — ABNORMAL LOW (ref 26.6–33.0)
MCHC: 31.7 g/dL (ref 31.5–35.7)
MCV: 74 fL — ABNORMAL LOW (ref 79–97)
Platelets: 477 10*3/uL — ABNORMAL HIGH (ref 150–450)
RBC: 5.05 x10E6/uL (ref 3.77–5.28)
RDW: 14.9 % (ref 11.7–15.4)
WBC: 9.1 10*3/uL (ref 3.4–10.8)

## 2018-05-03 LAB — PROLACTIN: Prolactin: 62.3 ng/mL — ABNORMAL HIGH (ref 4.8–23.3)

## 2018-05-03 LAB — FERRITIN: Ferritin: 173 ng/mL — ABNORMAL HIGH (ref 15–150)

## 2018-05-03 LAB — IRON: Iron: 40 ug/dL (ref 27–159)

## 2018-05-03 LAB — RUBELLA SCREEN: Rubella Antibodies, IGG: 1.99 index (ref >0.99)

## 2018-05-05 ENCOUNTER — Other Ambulatory Visit: Payer: Self-pay

## 2018-05-05 ENCOUNTER — Encounter: Payer: Self-pay | Admitting: Family Medicine

## 2018-05-05 ENCOUNTER — Encounter: Payer: Self-pay | Admitting: Internal Medicine

## 2018-05-05 ENCOUNTER — Ambulatory Visit (INDEPENDENT_AMBULATORY_CARE_PROVIDER_SITE_OTHER): Payer: 59 | Admitting: Internal Medicine

## 2018-05-05 ENCOUNTER — Ambulatory Visit (INDEPENDENT_AMBULATORY_CARE_PROVIDER_SITE_OTHER): Payer: 59 | Admitting: Family Medicine

## 2018-05-05 DIAGNOSIS — D352 Benign neoplasm of pituitary gland: Secondary | ICD-10-CM | POA: Diagnosis not present

## 2018-05-05 DIAGNOSIS — H9191 Unspecified hearing loss, right ear: Secondary | ICD-10-CM

## 2018-05-05 DIAGNOSIS — Z862 Personal history of diseases of the blood and blood-forming organs and certain disorders involving the immune mechanism: Secondary | ICD-10-CM

## 2018-05-05 DIAGNOSIS — E221 Hyperprolactinemia: Secondary | ICD-10-CM | POA: Diagnosis not present

## 2018-05-05 DIAGNOSIS — N912 Amenorrhea, unspecified: Secondary | ICD-10-CM

## 2018-05-05 MED ORDER — CABERGOLINE 0.5 MG PO TABS: 0.2500 mg | ORAL_TABLET | ORAL | 2 refills | Status: DC

## 2018-05-05 NOTE — Progress Notes (Signed)
Virtual Visit via Video Note  I connected with Ana Padilla on 05/05/18 at  9:00 AM EDT by a video enabled telemedicine application and verified that I am speaking with the correct person using two identifiers.  Location patient: home Location provider:work or home office Persons participating in the virtual visit: patient, provider  I discussed the limitations of evaluation and management by telemedicine and the availability of in person appointments. The patient expressed understanding and agreed to proceed.   HPI: Pt states overall she is doing well.  She finished her first semester back in school.  She is now working at [employer].  Pt and her bf were planning to get married and start a family, but COVID-19 pandemic has caused a delay in these plans.  H/o Anemia:  H/o abnormal uterine bleeding requiring iron infusions.  Taking SS tonic, vit D, and PNV.  Had labs 4/28 with Ob/Gyn.  Told her ferritin was high.    H/o Pituitary Adenoma: Recently noticed breast soreness, mild HAs, fatigue.  Prolactin elevated at 62.3 on 05/02/18.  Pt was on Cabergoline 0.25 mg in the past.  Has an appt with Dr. [name] today.  Ear trouble:  Feels like there is fluid in her R ear that makes it difficult to hear.  Has been going on x 6 months.  Tried to "pop" her ears.  Taking allergy meds and nasal spray with no relief.  States if chewing cannot hear anything.  Has difficulty hearing people speaking to her throughout the day.  Denies fever, ear pain or pressure, n/v, tinnitus, recent viral illness.  ROS: See pertinent positives and negatives per HPI.  Past Medical History:  Diagnosis Date  . Adenoma of pituitary ([place]) 08/2014  . Anemia   . IBS (irritable bowel syndrome)   . Seasonal allergies   . Sickle cell trait [facility])     Past Surgical History:  Procedure Laterality Date  . BLEPHAROPLASTY Right    lower lid x 3  . CHOLECYSTECTOMY  2016  . ELECTROLYSIS OF MISDIRECTED LASHES Right 08/04/2017   Procedure: ELECTROLYSIS OF MISDIRECTED LASHES RIGHT EYE;  Surgeon: Clista Bernhardt, MD;  Location: [place];  Service: Ophthalmology;  Laterality: Right;  . ENTROPIAN REPAIR Right 08/04/2017   Procedure: REPAIR OF ENTROPION WITH TARSAL STRIP RIGHT EYE;  Surgeon: Clista Bernhardt, MD;  Location: [place];  Service: Ophthalmology;  Laterality: Right;  . HYSTEROSCOPY W/D&C N/A 06/27/2017   Procedure: DILATATION & CURETTAGE/HYSTEROSCOPY;  Surgeon: Nunzio Cobbs, MD;  Location: [facility];  Service: Gynecology;  Laterality: N/A;  . iud placement     been removed    Family History  Adopted: Yes  Problem Relation Age of Onset  . Sickle cell trait Mother   . Heart disease Father     SOCIAL HX: Pt now working at [employer].  Pt and her boyfriend were planning to get married in May, but have postpone things given COVID-19 pandemic.     Current Outpatient Medications:  .  acetaminophen (TYLENOL) 500 MG tablet, Take 1 tablet (500 mg total) by mouth every 6 (six) hours as needed for mild pain or moderate pain., Disp: 30 tablet, Rfl: 0 .  cabergoline (DOSTINEX) 0.5 MG tablet, Take 0.5 tablets (0.25 mg total) by mouth once a week. (Patient taking differently: Take 0.25 mg by mouth 2 (two) times a week. ), Disp: 10 tablet, Rfl: 2 .  cetirizine (ZYRTEC) 10 MG tablet, Take 10 mg by mouth daily. , Disp: , Rfl:  .  Cholecalciferol (VITAMIN D) 2000 units CAPS, Take 2,000 Units by mouth daily. , Disp: , Rfl:  .  colestipol (COLESTID) 1 g tablet, Take 2 tablets (2 g total) by mouth 2 (two) times daily. Patient needs office visit for further refills!!!, Disp: 120 tablet, Rfl: 2 .  ferrous sulfate 325 (65 FE) MG tablet, Take 325 mg by mouth 3 (three) times daily with meals. , Disp: , Rfl:  .  fluticasone (FLONASE) 50 MCG/ACT nasal spray, Place 2 sprays into both nostrils daily., Disp: , Rfl:  .  ibuprofen (ADVIL,MOTRIN) 800 MG tablet, Take 1 tablet (800 mg total) by mouth every 8 (eight) hours as needed., Disp: 30  tablet, Rfl: 0 .  Multiple Vitamin (MULTIVITAMIN WITH MINERALS) TABS tablet, Take 1 tablet by mouth daily., Disp: , Rfl:  .  norethindrone (AYGESTIN) 5 MG tablet, Take 1 tablet (5 mg total) by mouth daily., Disp: 60 tablet, Rfl: 2 .  Tetrahydrozoline HCl (VISINE OP), Place 1 drop into both eyes daily as needed (dry eyes). , Disp: , Rfl:  .  Vitamin D, Ergocalciferol, (DRISDOL) 1.25 MG (50000 UT) CAPS capsule, Take 1 capsule (50,000 Units total) by mouth every 7 (seven) days., Disp: 12 capsule, Rfl: 0  EXAM:  VITALS per patient if applicable: RR between 95-28 bpm  GENERAL: alert, oriented, appears well and in no acute distress  HEENT: atraumatic, conjunctiva clear, no obvious abnormalities on inspection of external nose and ears  NECK: normal movements of the head and neck  LUNGS: on inspection no signs of respiratory distress, breathing rate appears normal, no obvious gross SOB, gasping or wheezing  CV: no obvious cyanosis  MS: moves all visible extremities without noticeable abnormality  PSYCH/NEURO: pleasant and cooperative, no obvious depression or anxiety, speech and thought processing grossly intact  ASSESSMENT AND PLAN:  Discussed the following assessment and plan:  Decreased hearing of right ear  -given duration of symptoms will place referral to ENT. - Plan: Ambulatory referral to ENT  History of anemia -improved/stable -hgb 11.9, iron 40, ferritin 173 on 05/02/2018.  -continue eating iron rich foods. -continue prenatal vitamin -Thrombocytosis improving, now 477. -continue to monitor  Pituitary adenoma ([place]) -Prolactin elevated at 62.3 -will likely need to restart Cabergoline -consider repeat imaging -encouraged to keep f/u with Dr. [name].  F/u prn   I discussed the assessment and treatment plan with the patient. The patient was provided an opportunity to ask questions and all were answered. The patient agreed with the plan and demonstrated an understanding of  the instructions.   The patient was advised to call back or seek an in-person evaluation if the symptoms worsen or if the condition fails to improve as anticipated.   [name], MD

## 2018-05-05 NOTE — Progress Notes (Addendum)
Patient ID: Ana Padilla, female   DOB: 10-Sep-1985, 33 y.o.   MRN: [id number]   Patient location: Home My location: Office  Referring Provider: Deeann Saint, MD  I connected with the patient on 05/05/18 at 10:24 AM EDT by a video enabled telemedicine application and verified that I am speaking with the correct person.   I discussed the limitations of evaluation and management by telemedicine and the availability of in person appointments. The patient expressed understanding and agreed to proceed.   Details of the encounter are shown below.  HPI  Ana Padilla is a 33 y.o.-year-old female, initially referred by her PCP, Dr. Salomon Fick, presenting for follow-up for pituitary adenoma and hyperprolactinemia.  Last visit 1 year and 3 months ago.  Reviewed and addended history: She was diagnosed with a pituitary adenoma in 2014 during investigation for amenorrhea.  At that time, prolactin level was 43.  She ruled out for adrenal insufficiency and Cushing's syndrome then.  She was started on Cabergoline in 2017 and at last visit she was taking 0.25 mg twice a week.  Her menses have been irregular but became regular after starting Cabergoline, however, she moved to [place] afterwards and was off Cabergoline for 7 months, during which she started to have amenorrhea again.  She restarted this in 10/2016.  At last visit, her prolactin level was excellent, at 4.5 and I advised her to decrease the dose of Cabergoline to 0.25 once a week only.  A new prolactin level from 07/2017 was still normal at 8.3.  She was lost to follow-up afterwards and it appears that she stopped the medication completely last fall... A prolactin level obtained 3 days ago was very high, at 62.3.  This was obtained during preconception counseling.  Reviewed recent prolactin levels: Lab Results  Component Value Date   PROLACTIN 62.3 (H) 05/02/2018   PROLACTIN 8.3 07/04/2017   PROLACTIN 4.5 02/11/2017   PROLACTIN 54.6 (H)  09/20/2016  03/04/2016: Prolactin 5.16, TSH 1.113 09/30/2015: Prolactin 21.3  07/29/2015: Prolactin 51.4  She denies galactorrhea. She mentions that she started to have some breast fulness and also HAs after stopping Cabergoline.   She previously had headaches in the past but these resolved after new glasses and blepharoplasty.  Also, she did not have a history of nausea, dizziness, headaches, from Cabergoline.  Of note, we checked her other pituitary hormones at last visit and these were normal:  Component     Latest Ref Rng & Units 02/11/2017  IGF-I, LC/MS     63 - 373 ng/mL 93  Z-Score (Female)     -2.0 - 2 SD -1.1  LH     mIU/mL 15.95  Cortisol - AM     mcg/dL 36.6  Y403 ACTH     6 - 50 pg/mL 16   Lab Results  Component Value Date   TSH 1.19 09/20/2016   FREET4 0.68 09/20/2016    07/29/2015: Cortisol 12.9, ACTH 29, IGF-I 121 (63-373)  ROS: Constitutional: no weight gain/no weight loss, no fatigue, no subjective hyperthermia, no subjective hypothermia Eyes: no blurry vision, no xerophthalmia ENT: no sore throat, no nodules palpated in neck, no dysphagia, no odynophagia, no hoarseness Cardiovascular: no CP/no SOB/no palpitations/no leg swelling Respiratory: no cough/no SOB/no wheezing Gastrointestinal: no N/no V/no D/no C/no acid reflux Musculoskeletal: no muscle aches/no joint aches Skin: no rashes, no hair loss Neurological: no tremors/no numbness/no tingling/no dizziness, + headaches + Breast fullness + Amenorrhea  I reviewed pt's medications, allergies, PMH, social hx,  family hx, and changes were documented in the history of present illness. Otherwise, unchanged from my initial visit note.  Past Medical History:  Diagnosis Date  . 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 Anemia   . IBS (irritable bowel syndrome)   . Seasonal allergies   . Sickle cell trait [facility])    Past Surgical History:  Procedure Laterality Date  . BLEPHAROPLASTY Right    lower lid x 3  .  CHOLECYSTECTOMY  2016  . ELECTROLYSIS OF MISDIRECTED LASHES Right 08/04/2017   Procedure: ELECTROLYSIS OF MISDIRECTED LASHES RIGHT EYE;  Surgeon: Floydene Flock, MD;  Location: [facility];  Service: Ophthalmology;  Laterality: Right;  . ENTROPIAN REPAIR Right 08/04/2017   Procedure: REPAIR OF ENTROPION WITH TARSAL STRIP RIGHT EYE;  Surgeon: Floydene Flock, MD;  Location: [facility];  Service: Ophthalmology;  Laterality: Right;  . HYSTEROSCOPY W/D&C N/A 06/27/2017   Procedure: DILATATION & CURETTAGE/HYSTEROSCOPY;  Surgeon: Patton Salles, MD;  Location: [facility];  Service: Gynecology;  Laterality: N/A;  . iud placement     been removed   Social History   Socioeconomic History  . Marital status: Single    Spouse name: Not on file  . Number of children: 0  Social Needs  Occupational History  .  Scheduler  Tobacco Use  . Smoking status: Never Smoker  . Smokeless tobacco: Never Used  Substance and Sexual Activity  . Alcohol use: Yes    Comment: socially   . Drug use: No   Current Outpatient Medications on File Prior to Visit  Medication Sig Dispense Refill  . acetaminophen (TYLENOL) 500 MG tablet Take 1 tablet (500 mg total) by mouth every 6 (six) hours as needed for mild pain or moderate pain. 30 tablet 0  . cabergoline (DOSTINEX) 0.5 MG tablet Take 0.5 tablets (0.25 mg total) by mouth once a week. (Patient taking differently: Take 0.25 mg by mouth 2 (two) times a week. ) 10 tablet 2  . cetirizine (ZYRTEC) 10 MG tablet Take 10 mg by mouth daily.     . Cholecalciferol (VITAMIN D) 2000 units CAPS Take 2,000 Units by mouth daily.     . colestipol (COLESTID) 1 g tablet Take 2 tablets (2 g total) by mouth 2 (two) times daily. Patient needs office visit for further refills!!! 120 tablet 2  . ferrous sulfate 325 (65 FE) MG tablet Take 325 mg by mouth 3 (three) times daily with meals.     . fluticasone (FLONASE) 50 MCG/ACT nasal spray Place 2 sprays into both nostrils daily.    Marland Kitchen ibuprofen  (ADVIL,MOTRIN) 800 MG tablet Take 1 tablet (800 mg total) by mouth every 8 (eight) hours as needed. 30 tablet 0  . Multiple Vitamin (MULTIVITAMIN WITH MINERALS) TABS tablet Take 1 tablet by mouth daily.    . norethindrone (AYGESTIN) 5 MG tablet Take 1 tablet (5 mg total) by mouth daily. 60 tablet 2  . Tetrahydrozoline HCl (VISINE OP) Place 1 drop into both eyes daily as needed (dry eyes).     . Vitamin D, Ergocalciferol, (DRISDOL) 1.25 MG (50000 UT) CAPS capsule Take 1 capsule (50,000 Units total) by mouth every 7 (seven) days. 12 capsule 0   No current facility-administered medications on file prior to visit.    Allergies  Allergen Reactions  . Food Other (See Comments)    Seasonal Allergies & fresh fruit---itchy mouth&nose   . Pollinex-T [Modified Tree Tyrosine Adsorbate]     Itchy nose and eyes,  ears, runny nose,    Family History  Adopted: Yes  Problem Relation Age of Onset  . Sickle cell trait Mother   . Heart disease Father   Hypertension in father  PE: There were no vitals taken for this visit. Wt Readings from Last 3 Encounters:  11/28/17 (!) 327 lb (148.3 kg)  10/07/17 (!) 343 lb (155.6 kg)  08/04/17 (!) 340 lb (154.2 kg)   Constitutional:  in NAD  The physical exam was not performed (virtual visit).  ASSESSMENT: 1. Pituitary adenoma  2.  Hyperprolactinemia  3.  Amenorrhea  PLAN:  1. And 2.  Patient has a history of pituitary adenoma, incidentally found during investigation for amenorrhea.  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  Her prolactin was high at that time, suspicious for prolactinoma, however, it appears that the prolactin level is too low for macroadenoma.  We discussed about the possibility of stopping compression from a pituitary tumor which may not be a prolactinoma, but also the possibility of hook effect of a falsely low prolactin due to assay interference from a very high prolactin level  in blood.  My plan to obtain the records of her MRI and then decide about testing a prolactin level with dilution, but I never obtain the results.  However, she did very well on Cabergoline, initially 0.25 mg twice a week, which we decreased to 0.25 mg once a week at last visit.  A subsequent prolactin level was still very good on the once a week dose.  However, she was lost to follow-up afterwards. -She is scheduled this appointment 1 year and 3 months after the previous, after a prolactin level returned high at the most recent visit with OB/GYN (62.3) for preconception counseling.  She revealed to her OB/GYN doctor that she has not been taking her Cabergoline. -We again discussed the importance of taking the medication to keep her prolactin level in the normal range so she can experience normal menstrual cycles and fertility. -She agrees to restart the medication.  Of note, she was tolerating it well in the past -After she restarts the medicine, we will check a prolactin level again in 2 months.  -However, if she does get pregnant before then, we will need to stop Cabergoline and will recheck after she gives birth and finishes breast-feeding. -We also discussed about obtaining a new MRI which she agrees with.  I will order this today.  We also discussed about trying again to obtain the images of her previous MRI to see how large her tumor was before. -I will see her back in 6 months but I will get in touch with her about her labs and the new MRI results  3.  Amenorrhea -Most likely due to her high prolactin -We will restart Cabergoline  Orders Placed This Encounter  Procedures  . MR Brain W Wo Contrast  . Prolactin   Received records from [facility] (Dr. 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  It decreased in size from 2015 to 2017:  MRI brain (07/29/2015): The previously reported hypoenhancing pituitary nodule, grossly, is slightly smaller, measuring 2.4 mm  (previously 4 mm).  The pituitary gland is normal in size.  The infundibulum is midline.  The optic chiasm is normal.  After 1 month of Cabergoline use in 08/2015, her prolactin normalized and she restarted to have menstrual cycles.  Also, she has a history  of adrenal insufficiency due to failed cosyntropin stimulation test in 2015, however, this was performed after 1 month use of dexamethasone and when retested by Dr. Garlon Hatchet, her ACTH and cortisol were within normal limits in 2017.  After her diagnosis of adrenal insufficiency, she was on hydrocortisone for 6 months with significant weight gain. Dr. Garlon Hatchet strongly recommended against restarting corticosteroids.  Patient ruled out for Cushing's syndrome.  She also ruled out for PCOS.  Carlus Pavlov, MD PhD [facility]

## 2018-05-05 NOTE — Patient Instructions (Signed)
Please restart Cabergoline 0.25 mg once a week.  Please stop the Cabergoline immediately after you get pregnant.  Come back for labs in 2 months.  I ordered a new MRI for you.  Please return for another visit in 6 months.

## 2018-05-10 ENCOUNTER — Ambulatory Visit: Payer: 59 | Admitting: Obstetrics and Gynecology

## 2018-05-25 DIAGNOSIS — H6982 Other specified disorders of Eustachian tube, left ear: Secondary | ICD-10-CM | POA: Insufficient documentation

## 2018-05-30 ENCOUNTER — Other Ambulatory Visit: Payer: 59

## 2018-06-13 ENCOUNTER — Other Ambulatory Visit: Payer: 59

## 2018-06-20 ENCOUNTER — Other Ambulatory Visit: Payer: 59

## 2018-06-26 ENCOUNTER — Other Ambulatory Visit: Payer: Self-pay | Admitting: Internal Medicine

## 2018-07-05 ENCOUNTER — Telehealth: Payer: Self-pay | Admitting: Obstetrics and Gynecology

## 2018-07-05 NOTE — Telephone Encounter (Signed)
Spoke with patient.   1. WebEx visit on 04/28/18 for preconception counseling. Patient states she reduced to Aygestin 7.5 mg daily as recommended, experiencing increased cramping and bleeding. Switched back to Aygestin 5mg  bid 3 days ago. Patient states she does not plan getting pregnant for at least 6 months, ok to continue Aygestin 5mg  bid for now? Has one refill remaining.   2. States she feels like the change in hormones may have made her prolactin levels "off", asking if this can be repeated/ patient is scheduled for brain MRI for pituitary adenoma managed by endocrinology. Recommended patient f/u with endocrinology regarding repeat prolactin level.   Advised patient I will review with Dr. Quincy Simmonds and return call with recommendations. Patient agreeable.   Dr. Quincy Simmonds -please advise.

## 2018-07-05 NOTE — Telephone Encounter (Signed)
Left message to call Channin Agustin, RN at [facility] [phone].   

## 2018-07-05 NOTE — Telephone Encounter (Signed)
Patient want to discuss changing dosage on medication and prolactin levels check.

## 2018-07-05 NOTE — Telephone Encounter (Signed)
Ok for Aygestin 5 mg po bid.  #30, RF none.  She needs an annual exam with me for further refills.

## 2018-07-06 MED ORDER — NORETHINDRONE ACETATE 5 MG PO TABS: 5.0000 mg | ORAL_TABLET | Freq: Two times a day (BID) | ORAL | 0 refills | Status: DC

## 2018-07-06 NOTE — Telephone Encounter (Signed)
Spoke with patient, advised per Dr. Quincy Simmonds. Rx for aygestin to verified pharmacy. AEX scheduled for 8/19 at 9am. Patient verbalizes understanding.   Routing to provider for final review. Patient is agreeable to disposition. Will close encounter.

## 2018-07-06 NOTE — Telephone Encounter (Addendum)
Left message to call Sharee Pimple, RN at [facility].   Reviewed Rx with Dr. Quincy Simmonds. Aygestin 5mg  bid #60/0RF. Needs AEX for further refills. Last AEX 04/28/17   Rx pended

## 2018-07-07 ENCOUNTER — Other Ambulatory Visit: Payer: 59

## 2018-07-17 ENCOUNTER — Telehealth: Payer: Self-pay | Admitting: Internal Medicine

## 2018-07-17 ENCOUNTER — Other Ambulatory Visit: Payer: 59

## 2018-07-17 ENCOUNTER — Other Ambulatory Visit: Payer: Self-pay

## 2018-07-17 ENCOUNTER — Ambulatory Visit
Admission: RE | Admit: 2018-07-17 | Discharge: 2018-07-17 | Disposition: A | Discharge status: Home / Self Care | Payer: 59 | Source: Ambulatory Visit | Attending: Internal Medicine | Admitting: Internal Medicine

## 2018-07-17 DIAGNOSIS — D352 Benign neoplasm of pituitary gland: Secondary | ICD-10-CM

## 2018-07-17 MED ORDER — GADOBENATE DIMEGLUMINE 529 MG/ML IV SOLN
10.0000 mL | Freq: Once | INTRAVENOUS | Status: AC | PRN
  Administered 2018-07-17: 10 mL via INTRAVENOUS

## 2018-07-17 NOTE — Telephone Encounter (Signed)
I will send her a message through my chart.  Yes, I did get the records.  I also got the results of her new MRI.  I will let her know.

## 2018-07-17 NOTE — Telephone Encounter (Signed)
I have not seen the records come in and I do not see any recent lab results from [place]

## 2018-07-17 NOTE — Telephone Encounter (Signed)
Patient called in regards to wanting to speak with the nurse in regards to her lab results and also wanted to check to see if Dr.Gherghe received her records from [place].  Please Advise, Thanks

## 2018-07-24 ENCOUNTER — Other Ambulatory Visit (INDEPENDENT_AMBULATORY_CARE_PROVIDER_SITE_OTHER): Payer: 59

## 2018-07-24 ENCOUNTER — Other Ambulatory Visit: Payer: Self-pay

## 2018-07-24 DIAGNOSIS — E221 Hyperprolactinemia: Secondary | ICD-10-CM | POA: Diagnosis not present

## 2018-07-25 LAB — PROLACTIN: Prolactin: 3 ng/mL

## 2018-07-28 ENCOUNTER — Telehealth: Payer: Self-pay | Admitting: Internal Medicine

## 2018-07-28 MED ORDER — COLESTIPOL HCL 1 G PO TABS: 2.0000 g | ORAL_TABLET | Freq: Two times a day (BID) | ORAL | 2 refills | Status: DC

## 2018-07-28 NOTE — Telephone Encounter (Signed)
Refilled Colestid; patient needs to keep upcoming appointment with Amy for further refills

## 2018-07-28 NOTE — Telephone Encounter (Signed)
Pt is scheduled for OV with Amy and requested refill on Cholestipol sent to [facility] in [place].

## 2018-08-15 ENCOUNTER — Telehealth: Payer: Self-pay | Admitting: *Deleted

## 2018-08-15 NOTE — Telephone Encounter (Signed)
Patient has an appointment with Dr. Volanda Napoleon 8/14

## 2018-08-15 NOTE — Telephone Encounter (Signed)
Copied from [facility] [id number]. Topic: Appointment Scheduling - Scheduling Inquiry for Clinic >> Aug 15, 2018  1:29 PM Yvette Rack wrote: Reason for CRM: Pt would like to schedule an appt with Dr. Volanda Napoleon due to fatigue. Attempted to transfer pt to the office but there was no answer. Pt requests call back.

## 2018-08-18 ENCOUNTER — Other Ambulatory Visit: Payer: Self-pay

## 2018-08-18 ENCOUNTER — Telehealth: Payer: 59 | Admitting: Family Medicine

## 2018-08-21 ENCOUNTER — Other Ambulatory Visit: Payer: Self-pay

## 2018-08-21 ENCOUNTER — Telehealth (INDEPENDENT_AMBULATORY_CARE_PROVIDER_SITE_OTHER): Payer: Managed Care, Other (non HMO) | Admitting: Family Medicine

## 2018-08-21 DIAGNOSIS — F514 Sleep terrors [night terrors]: Secondary | ICD-10-CM | POA: Diagnosis not present

## 2018-08-21 DIAGNOSIS — M79672 Pain in left foot: Secondary | ICD-10-CM

## 2018-08-21 DIAGNOSIS — Z6841 Body Mass Index (BMI) 40.0 and over, adult: Secondary | ICD-10-CM

## 2018-08-21 DIAGNOSIS — Z3009 Encounter for other general counseling and advice on contraception: Secondary | ICD-10-CM | POA: Diagnosis not present

## 2018-08-21 NOTE — Progress Notes (Signed)
Virtual Visit via Video Note  I connected with Ana Padilla on 08/21/18 at 11:00 AM EDT by a video enabled telemedicine application 2/2 [id number] pandemic and verified that I am speaking with the correct person using two identifiers.  Location patient: home Location provider:work or home office Persons participating in the virtual visit: patient, provider  I discussed the limitations of evaluation and management by telemedicine and the availability of in person appointments. The patient expressed understanding and agreed to proceed.   HPI: Pt is a 33 yo female with pmh sig for pituitary adenoma, anemia, obesity, sickle cell trait who is seen for a few ongoing concerns.  Having night terrors and night sweats.  Endorses waking up screaming.  Does not note any changes to meds, increased stress, recent illness or death in the family.  Pt wasn't sure if her meds agestin and colestipol could cause it.  Pt has thought about counseling.  Endorses some possible PTSD.  Pt interested in family planning pt has a h/o sickle cell trait.  Pt and her fiance are planing to get married in October 2020.  Pt would like to become pregnant in the next 5 months, but is ok if things happen sooner.  Pt taking PNVs.  She has an appt with her OB/Gyn coming up.  Pt also wants to lose weight.  Currently 320 lbs.  Thought about weight loss surgery, but has changed her mind.    L heel with tightness along sides.  Stands on hard concrete floor at work.  Is considering investing in a better work shoe.  Denies injury, edema.   ROS: See pertinent positives and negatives per HPI.  Past Medical History:  Diagnosis Date  . Adenoma of pituitary ([place]) 08/2014  . Anemia   . IBS (irritable bowel syndrome)   . Seasonal allergies   . Sickle cell trait [facility])     Past Surgical History:  Procedure Laterality Date  . BLEPHAROPLASTY Right    lower lid x 3  . CHOLECYSTECTOMY  2016  . ELECTROLYSIS OF MISDIRECTED LASHES Right  08/04/2017   Procedure: ELECTROLYSIS OF MISDIRECTED LASHES RIGHT EYE;  Surgeon: Clista Bernhardt, MD;  Location: [place];  Service: Ophthalmology;  Laterality: Right;  . ENTROPIAN REPAIR Right 08/04/2017   Procedure: REPAIR OF ENTROPION WITH TARSAL STRIP RIGHT EYE;  Surgeon: Clista Bernhardt, MD;  Location: [place];  Service: Ophthalmology;  Laterality: Right;  . HYSTEROSCOPY W/D&C N/A 06/27/2017   Procedure: DILATATION & CURETTAGE/HYSTEROSCOPY;  Surgeon: Nunzio Cobbs, MD;  Location: [facility];  Service: Gynecology;  Laterality: N/A;  . iud placement     been removed    Family History  Adopted: Yes  Problem Relation Age of Onset  . Sickle cell trait Mother   . Heart disease Father      EXAM:  VITALS per patient if applicable: RR between 85-46 bpm, wt 320 lbs  GENERAL: alert, oriented, appears well and in no acute distress  HEENT: atraumatic, conjunctiva clear, no obvious abnormalities on inspection of external nose and ears  NECK: normal movements of the head and neck  LUNGS: on inspection no signs of respiratory distress, breathing rate appears normal, no obvious gross SOB, gasping or wheezing  CV: no obvious cyanosis  MS: moves all visible extremities without noticeable abnormality  PSYCH/NEURO: pleasant and cooperative, no obvious depression or anxiety, speech and thought processing grossly intact  ASSESSMENT AND PLAN:  Discussed the following assessment and plan:  Night terrors -discussed possible  causes  -given past hx of possible PTSD discussed counseling.  Pt considering.  Given info on various providers in the area.  Family planning advice -encouraged to continue PNVs -dicussed healthy wt gain during pregnancy -pt encouraged to f/u with OB/Gyn as would likely be a high risk pregnancy -given handouts  Pain of left heel  -discussed various causes including heel spur, plantar fasciitis -discussed supportive care -discussed stretching  exercises.  Obesity -BMI 50 -pt lost 30 lbs.  Was 350 lbs  02/2017 -continued weight loss encouraged.   -continue lifestyle modifications  F/u prn in 2-4 wks   I discussed the assessment and treatment plan with the patient. The patient was provided an opportunity to ask questions and all were answered. The patient agreed with the plan and demonstrated an understanding of the instructions.   The patient was advised to call back or seek an in-person evaluation if the symptoms worsen or if the condition fails to improve as anticipated.   Ana Ruddy, MD

## 2018-08-21 NOTE — Patient Instructions (Addendum)
Healthy Weight Gain During Pregnancy, Adult A certain amount of weight gain during pregnancy is normal and healthy. How much weight you should gain depends on your overall health and a measurement called BMI (body mass index). BMI is an estimate of your body fat based on your height and weight. You can use an online calculator to figure out your BMI, or you can ask your health care provider to calculate it for you at your next visit. Your recommended pregnancy weight gain is based on your pre-pregnancy BMI. General guidelines for a healthy total weight gain during pregnancy are listed below. If your BMI at or before the start of your pregnancy is:  Less than 18.5 (underweight), you should gain 28-40 lb (13-18 kg).  18.5-24.9 (normal weight), you should gain 25-35 lb (11-16 kg).  25-29.9 (overweight), you should gain 15-25 lb (7-11 kg).  30 or higher (obese), you should gain 11-20 lb (5-9 kg). These ranges vary depending on your individual health. If you are carrying more than one baby (multiples), it may be safe to gain more weight than these recommendations. If you gain less weight than recommended, that may be safe as long as your baby is growing and developing normally. How can unhealthy weight gain affect me and my baby? Gaining too much weight during pregnancy can lead to pregnancy complications, such as:  A temporary form of diabetes that develops during pregnancy (gestational diabetes).  High blood pressure during pregnancy and protein in your urine (preeclampsia).  High blood pressure during pregnancy without protein in your urine (gestational hypertension).  Your baby having a high weight at birth, which may: ? Raise your risk of having a more difficult delivery or a surgical delivery (cesarean delivery, or C-section). ? Raise your child's risk of developing obesity during childhood. Not gaining enough weight can be life-threatening for your baby, and it may raise your baby's chances  of:  Being born early (preterm).  Growing more slowly than normal during pregnancy (growth restriction).  Having a low weight at birth. What actions can I take to gain a healthy amount of weight during pregnancy? General instructions  Keep track of your weight gain during pregnancy.  Take over-the-counter and prescription medicines only as told by your health care provider. Take all prenatal supplements as directed.  Keep all health care visits during pregnancy (prenatal visits). These visits are a good time to discuss your weight gain. Your health care provider will weigh you at each visit to make sure you are gaining a healthy amount of weight. Nutrition   Eat a balanced, nutrient-rich diet. Eat plenty of: ? Fruits and vegetables, such as berries and broccoli. ? Whole grains, such as millet, barley, whole-wheat breads and cereals, and oatmeal. ? Low-fat dairy products or non-dairy products such as almond milk or rice milk. ? Protein foods, such as lean meat, chicken, eggs, and legumes (such as peas, beans, soybeans, and lentils).  Avoid foods that are fried or have a lot of fat, salt (sodium), or sugar.  Drink enough fluid to keep your urine pale yellow.  Choose healthy snack and drink options when you are at work or on the go: ? Drink water. Avoid soda, sports drinks, and juices that have added sugar. ? Avoid drinks with caffeine, such as coffee and energy drinks. ? Eat snacks that are high in protein, such as nuts, protein bars, and low-fat yogurt. ? Carry convenient snacks in your purse that do not need refrigeration, such as a pack of   trail mix, an apple, or a granola bar.  If you need help improving your diet, work with a health care provider or a diet and nutrition specialist (dietitian). Activity   Exercise regularly, as told by your health care provider. ? If you were active before becoming pregnant, you may be able to continue your regular fitness activities. ? If  you were not active before pregnancy, you may gradually build up to exercising for 30 or more minutes on most days of the week. This may include walking, swimming, or yoga.  Ask your health care provider what activities are safe for you. Talk with your health care provider about whether you may need to be excused from certain school or work activities. Where to find more information Learn more about managing your weight gain during pregnancy from:  American Pregnancy Association: www.americanpregnancy.org  [place] Department of Agriculture pregnancy weight gain calculator: FormerBoss.no Summary  Too much weight gain during pregnancy can lead to complications for you and your baby.  Find out your pre-pregnancy BMI to determine how much weight gain is healthy for you.  Eat nutritious foods and stay active.  Keep all of your prenatal visits as told by your health care provider. This information is not intended to replace advice given to you by your health care provider. Make sure you discuss any questions you have with your health care provider. Document Released: 09/10/2016 Document Revised: 09/10/2016 Document Reviewed: 09/10/2016 Elsevier Patient Education  2020 [employer].  Prenatal Care Prenatal care is health care during pregnancy. It helps you and your unborn baby (fetus) stay as healthy as possible. Prenatal care may be provided by a midwife, a family practice health care provider, or a childbirth and pregnancy specialist (obstetrician). How does this affect me? During pregnancy, you will be closely monitored for any new conditions that might develop. To lower your risk of pregnancy complications, you and your health care provider will talk about any underlying conditions you have. How does this affect my baby? Early and consistent prenatal care increases the chance that your baby will be healthy during pregnancy. Prenatal care lowers the risk that your baby will be:  Born  early (prematurely).  Smaller than expected at birth (small for gestational age). What can I expect at the first prenatal care visit? Your first prenatal care visit will likely be the longest. You should schedule your first prenatal care visit as soon as you know that you are pregnant. Your first visit is a good time to talk about any questions or concerns you have about pregnancy. At your visit, you and your health care provider will talk about:  Your medical history, including: ? Any past pregnancies. ? Your family's medical history. ? The baby's father's medical history. ? Any long-term (chronic) health conditions you have and how you manage them. ? Any surgeries or procedures you have had. ? Any current over-the-counter or prescription medicines, herbs, or supplements you are taking.  Other factors that could pose a risk to your baby, including:  Your home setting and your stress levels, including: ? Exposure to abuse or violence. ? Household financial strain. ? Mental health conditions you have.  Your daily health habits, including diet and exercise. Your health care provider will also:  Measure your weight, height, and blood pressure.  Do a physical exam, including a pelvic and breast exam.  Perform blood tests and urine tests to check for: ? Urinary tract infection. ? Sexually transmitted infections (STIs). ? Low iron levels  in your blood (anemia). ? Blood type and certain proteins on red blood cells (Rh antibodies). ? Infections and immunity to viruses, such as hepatitis B and rubella. ? HIV (human immunodeficiency virus).  Do an ultrasound to confirm your baby's growth and development and to help predict your estimated due date (EDD). This ultrasound is done with a probe that is inserted into the vagina (transvaginal ultrasound).  Discuss your options for genetic screening.  Give you information about how to keep yourself and your baby healthy, including: ? Nutrition  and taking vitamins. ? Physical activity. ? How to manage pregnancy symptoms such as nausea and vomiting (morning sickness). ? Infections and substances that may be harmful to your baby and how to avoid them. ? Food safety. ? Dental care. ? Working. ? Travel. ? Warning signs to watch for and when to call your health care provider. How often will I have prenatal care visits? After your first prenatal care visit, you will have regular visits throughout your pregnancy. The visit schedule is often as follows:  Up to week 28 of pregnancy: once every 4 weeks.  28-36 weeks: once every 2 weeks.  After 36 weeks: every week until delivery. Some women may have visits more or less often depending on any underlying health conditions and the health of the baby. Keep all follow-up and prenatal care visits as told by your health care provider. This is important. What happens during routine prenatal care visits? Your health care provider will:  Measure your weight and blood pressure.  Check for fetal heart sounds.  Measure the height of your uterus in your abdomen (fundal height). This may be measured starting around week 20 of pregnancy.  Check the position of your baby inside your uterus.  Ask questions about your diet, sleeping patterns, and whether you can feel the baby move.  Review warning signs to watch for and signs of labor.  Ask about any pregnancy symptoms you are having and how you are dealing with them. Symptoms may include: ? Headaches. ? Nausea and vomiting. ? Vaginal discharge. ? Swelling. ? Fatigue. ? Constipation. ? Any discomfort, including back or pelvic pain. Make a list of questions to ask your health care provider at your routine visits. What tests might I have during prenatal care visits? You may have blood, urine, and imaging tests throughout your pregnancy, such as:  Urine tests to check for glucose, protein, or signs of infection.  Glucose tests to check for  a form of diabetes that can develop during pregnancy (gestational diabetes mellitus). This is usually done around week 24 of pregnancy.  An ultrasound to check your baby's growth and development and to check for birth defects. This is usually done around week 20 of pregnancy.  A test to check for group B strep (GBS) infection. This is usually done around week 36 of pregnancy.  Genetic testing. This may include blood or imaging tests, such as an ultrasound. Some genetic tests are done during the first trimester and some are done during the second trimester. What else can I expect during prenatal care visits? Your health care provider may recommend getting certain vaccines during pregnancy. These may include:  A yearly flu shot (annual influenza vaccine). This is especially important if you will be pregnant during flu season.  Tdap (tetanus, diphtheria, pertussis) vaccine. Getting this vaccine during pregnancy can protect your baby from whooping cough (pertussis) after birth. This vaccine may be recommended between weeks 27 and 36 of pregnancy. Later  in your pregnancy, your health care provider may give you information about:  Childbirth and breastfeeding classes.  Choosing a health care provider for your baby.  Umbilical cord banking.  Breastfeeding.  Birth control after your baby is born.  The hospital labor and delivery unit and how to tour it.  Registering at the hospital before you go into labor. Where to find more information  Office on Women's Health: LegalWarrants.gl  American Pregnancy Association: americanpregnancy.org  March of Dimes: marchofdimes.org Summary  Prenatal care helps you and your baby stay as healthy as possible during pregnancy.  Your first prenatal care visit will most likely be the longest.  You will have visits and tests throughout your pregnancy to monitor your health and your baby's health.  Bring a list of questions to your visits to ask your  health care provider.  Make sure to keep all follow-up and prenatal care visits with your health care provider. This information is not intended to replace advice given to you by your health care provider. Make sure you discuss any questions you have with your health care provider. Document Released: 12/24/2002 Document Revised: 04/12/2018 Document Reviewed: 12/20/2016 Elsevier Patient Education  2020 [employer].  Preparing for Pregnancy If you are considering becoming pregnant, make an appointment to see your regular health care provider to learn how to prepare for a safe and healthy pregnancy (preconception care). During a preconception care visit, your health care provider will:  Do a complete physical exam, including a Pap test.  Take a complete medical history.  Give you information, answer your questions, and help you resolve problems. Preconception checklist Medical history  Tell your health care provider about any current or past medical conditions. Your pregnancy or your ability to become pregnant may be affected by chronic conditions, such as diabetes, chronic hypertension, and thyroid problems.  Include your family's medical history as well as your partner's medical history.  Tell your health care provider about any history of STIs (sexually transmitted infections).These can affect your pregnancy. In some cases, they can be passed to your baby. Discuss any concerns that you have about STIs.  If indicated, discuss the benefits of genetic testing. This testing will show whether there are any genetic conditions that may be passed from you or your partner to your baby.  Tell your health care provider about: ? Any problems you have had with conception or pregnancy. ? Any medicines you take. These include vitamins, herbal supplements, and over-the-counter medicines. ? Your history of immunizations. Discuss any vaccinations that you may need. Diet  Ask your health care  provider what to include in a healthy diet that has a balance of nutrients. This is especially important when you are pregnant or preparing to become pregnant.  Ask your health care provider to help you reach a healthy weight before pregnancy. ? If you are overweight, you may be at higher risk for certain complications, such as high blood pressure, diabetes, and preterm birth. ? If you are underweight, you are more likely to have a baby who has a low birth weight. Lifestyle, work, and home  Let your health care provider know: ? About any lifestyle habits that you have, such as alcohol use, drug use, or smoking. ? About recreational activities that may put you at risk during pregnancy, such as downhill skiing and certain exercise programs. ? Tell your health care provider about any international travel, especially any travel to places with an active [place] virus outbreak. ? About harmful  substances that you may be exposed to at work or at home. These include chemicals, pesticides, radiation, or even litter boxes. ? If you do not feel safe at home. Mental health  Tell your health care provider about: ? Any history of mental health conditions, including feelings of depression, sadness, or anxiety. ? Any medicines that you take for a mental health condition. These include herbs and supplements. Home instructions to prepare for pregnancy Lifestyle   Eat a balanced diet. This includes fresh fruits and vegetables, whole grains, lean meats, low-fat dairy products, healthy fats, and foods that are high in fiber. Ask to meet with a nutritionist or registered dietitian for assistance with meal planning and goals.  Get regular exercise. Try to be active for at least 30 minutes a day on most days of the week. Ask your health care provider which activities are safe during pregnancy.  Do not use any products that contain nicotine or tobacco, such as cigarettes and e-cigarettes. If you need help quitting,  ask your health care provider.  Do not drink alcohol.  Do not take illegal drugs.  Maintain a healthy weight. Ask your health care provider what weight range is right for you. General instructions  Keep an accurate record of your menstrual periods. This makes it easier for your health care provider to determine your baby's due date.  Begin taking prenatal vitamins and folic acid supplements daily as directed by your health care provider.  Manage any chronic conditions, such as high blood pressure and diabetes, as told by your health care provider. This is important. How do I know that I am pregnant? You may be pregnant if you have been sexually active and you miss your period. Symptoms of early pregnancy include:  Mild cramping.  Very light vaginal bleeding (spotting).  Feeling unusually tired.  Nausea and vomiting (morning sickness). If you have any of these symptoms and you suspect that you might be pregnant, you can take a home pregnancy test. These tests check for a hormone in your urine (human chorionic gonadotropin, or hCG). A woman's body begins to make this hormone during early pregnancy. These tests are very accurate. Wait until at least the first day after you miss your period to take one. If the test shows that you are pregnant (you get a positive result), call your health care provider to make an appointment for prenatal care. What should I do if I become pregnant?      Make an appointment with your health care provider as soon as you suspect you are pregnant.  Do not use any products that contain nicotine, such as cigarettes, chewing tobacco, and e-cigarettes. If you need help quitting, ask your health care provider.  Do not drink alcoholic beverages. Alcohol is related to a number of birth defects.  Avoid toxic odors and chemicals.  You may continue to have sexual intercourse if it does not cause pain or other problems, such as vaginal bleeding. This information  is not intended to replace advice given to you by your health care provider. Make sure you discuss any questions you have with your health care provider. Document Released: 12/04/2007 Document Revised: 12/23/2016 Document Reviewed: 07/13/2015 Elsevier Patient Education  [place].  Plantar Fasciitis Rehab Ask your health care provider which exercises are safe for you. Do exercises exactly as told by your health care provider and adjust them as directed. It is normal to feel mild stretching, pulling, tightness, or discomfort as you do these exercises.  Stop right away if you feel sudden pain or your pain gets worse. Do not begin these exercises until told by your health care provider. Stretching and range-of-motion exercises These exercises warm up your muscles and joints and improve the movement and flexibility of your foot. These exercises also help to relieve pain. Plantar fascia stretch  1. Sit with your left / right leg crossed over your opposite knee. 2. Hold your heel with one hand with that thumb near your arch. With your other hand, hold your toes and gently pull them back toward the top of your foot. You should feel a stretch on the bottom of your toes or your foot (plantar fascia) or both. 3. Hold this stretch for__________ seconds. 4. Slowly release your toes and return to the starting position. Repeat __________ times. Complete this exercise __________ times a day. Gastrocnemius stretch, standing This exercise is also called a calf (gastroc) stretch. It stretches the muscles in the back of the upper calf. 1. Stand with your hands against a wall. 2. Extend your left / right leg behind you, and bend your front knee slightly. 3. Keeping your heels on the floor and your back knee straight, shift your weight toward the wall. Do not arch your back. You should feel a gentle stretch in your upper left / right calf. 4. Hold this position for __________ seconds. Repeat __________  times. Complete this exercise __________ times a day. Soleus stretch, standing This exercise is also called a calf (soleus) stretch. It stretches the muscles in the back of the lower calf. 1. Stand with your hands against a wall. 2. Extend your left / right leg behind you, and bend your front knee slightly. 3. Keeping your heels on the floor, bend your back knee and shift your weight slightly over your back leg. You should feel a gentle stretch deep in your lower calf. 4. Hold this position for __________ seconds. Repeat __________ times. Complete this exercise __________ times a day. Gastroc and soleus stretch, standing step This exercise stretches the muscles in the back of the lower leg. These muscles are in the upper calf (gastrocnemius) and the lower calf (soleus). 1. Stand with the ball of your left / right foot on a step. The ball of your foot is on the walking surface, right under your toes. 2. Keep your other foot firmly on the same step. 3. Hold on to the wall or a railing for balance. 4. Slowly lift your other foot, allowing your body weight to press your left / right heel down over the edge of the step. You should feel a stretch in your left / right calf. 5. Hold this position for __________ seconds. 6. Return both feet to the step. 7. Repeat this exercise with a slight bend in your left / right knee. Repeat __________ times with your left / right knee straight and __________ times with your left / right knee bent. Complete this exercise __________ times a day. Balance exercise This exercise builds your balance and strength control of your arch to help take pressure off your plantar fascia. Single leg stand If this exercise is too easy, you can try it with your eyes closed or while standing on a pillow. 1. Without shoes, stand near a railing or in a doorway. You may hold on to the railing or door frame as needed. 2. Stand on your left / right foot. Keep your big toe down on the  floor and try to keep your arch  lifted. Do not let your foot roll inward. 3. Hold this position for __________ seconds. Repeat __________ times. Complete this exercise __________ times a day. This information is not intended to replace advice given to you by your health care provider. Make sure you discuss any questions you have with your health care provider. Document Released: 12/21/2004 Document Revised: 04/13/2018 Document Reviewed: 10/19/2017 Elsevier Patient Education  2020 [employer].

## 2018-08-22 NOTE — Progress Notes (Signed)
33 y.o. G0P0000 [name] here for annual exam.    She is on Dostinex and Aygestin.  She is now taking Aygestin 5 mg po bid.  She tried to lower down to Aygestin 7.5 mg daily, and she started having bleeding.  She is not really using condoms for pregnancy prevention.  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  She has follow up with Dr. [name] at the end of this year.   Working for [employer] and is back in school.  Getting married Oct. 3!  She did a UPT yesterday, and it was negative.   PCP:  Grier Mitts, MD   No LMP recorded. (Menstrual status: Irregular Periods).           Sexually active: Yes.    The current method of family planning is condoms most of the time.    Exercising: No.  The patient does not participate in regular exercise at present. Smoker:  no  Health Maintenance: Pap: 04-28-17 Neg:Neg HR HPV, 2017 normal History of abnormal Pap:  no MMG:  n/a Colonoscopy:  n/a BMD:   n/a  Result  n/a TDaP:  07-01-17 Gardasil:   Yes--Took 1 injection 10-15 yrs ago HIV:04-28-17 NR Hep C:04-28-17 Neg Screening Labs:   ----   reports that she has never smoked. She has never used smokeless tobacco. She reports current alcohol use of about 2.0 standard drinks of alcohol per week. She reports that she does not use drugs.  Past Medical History:  Diagnosis Date  . Adenoma of pituitary ([place]) 08/2014  . Anemia   . IBS (irritable bowel syndrome)   . Seasonal allergies   . Sickle cell trait [facility])     Past Surgical History:  Procedure Laterality Date  . BLEPHAROPLASTY Right    lower lid x 3  . CHOLECYSTECTOMY  2016  . ELECTROLYSIS OF MISDIRECTED LASHES Right 08/04/2017   Procedure: ELECTROLYSIS OF MISDIRECTED LASHES RIGHT EYE;  Surgeon: Clista Bernhardt, MD;  Location: [place];  Service: Ophthalmology;  Laterality: Right;  . ENTROPIAN REPAIR Right 08/04/2017   Procedure: REPAIR OF ENTROPION WITH TARSAL STRIP RIGHT EYE;  Surgeon: Clista Bernhardt, MD;   Location: [place];  Service: Ophthalmology;  Laterality: Right;  . HYSTEROSCOPY W/D&C N/A 06/27/2017   Procedure: DILATATION & CURETTAGE/HYSTEROSCOPY;  Surgeon: Nunzio Cobbs, MD;  Location: [facility];  Service: Gynecology;  Laterality: N/A;  . iud placement     been removed    Current Outpatient Medications  Medication Sig Dispense Refill  . acetaminophen (TYLENOL) 500 MG tablet Take 1 tablet (500 mg total) by mouth every 6 (six) hours as needed for mild pain or moderate pain. 30 tablet 0  . cabergoline (DOSTINEX) 0.5 MG tablet Take 0.5 tablets (0.25 mg total) by mouth once a week. 10 tablet 2  . cetirizine (ZYRTEC) 10 MG tablet Take 10 mg by mouth daily.     . colestipol (COLESTID) 1 g tablet Take 2 tablets (2 g total) by mouth 2 (two) times daily. Patient must keep upcoming office visit for further refills!!! 120 tablet 2  . fluticasone (FLONASE) 50 MCG/ACT nasal spray Place 2 sprays into both nostrils daily.    . Iron-Vitamins (S.S.S. TONIC PO) Take 1 tablet by mouth daily.    . norethindrone (AYGESTIN) 5 MG tablet Take 1 tablet (5 mg total) by mouth 2 (two) times daily. 60 tablet 0  . Prenatal Vit-Fe Fumarate-FA (PRENATAL VITAMIN PO) Take 1 tablet by mouth  daily.     No current facility-administered medications for this visit.     Family History  Adopted: Yes  Problem Relation Age of Onset  . Sickle cell trait Mother   . Heart disease Father     Review of Systems  All other systems reviewed and are negative.   Exam:   BP 122/82 (Cuff Size: Large)   Pulse 70   Temp (!) 96.7 F (35.9 C)   Resp 18   Ht 5\' 7"  (1.702 m)   Wt (!) 329 lb 6.4 oz (149.4 kg)   BMI 51.59 kg/m     General appearance: alert, cooperative and appears stated age Head: normocephalic, without obvious abnormality, atraumatic Neck: no adenopathy, supple, symmetrical, trachea midline and thyroid normal to inspection and palpation Lungs: clear to auscultation bilaterally Breasts: normal appearance,  no masses or tenderness, No nipple retraction or dimpling, No nipple discharge or bleeding, No axillary adenopathy Heart: regular rate and rhythm Abdomen: soft, non-tender; no masses, no organomegaly Extremities: extremities normal, atraumatic, no cyanosis or edema Skin: skin color, texture, turgor normal. No rashes or lesions Lymph nodes: cervical, supraclavicular, and axillary nodes normal. Neurologic: grossly normal  Pelvic: External genitalia:  no lesions              No abnormal inguinal nodes palpated.              Urethra:  normal appearing urethra with no masses, tenderness or lesions              Bartholins and Skenes: normal                 Vagina: normal appearing vagina with normal color and discharge, no lesions              Cervix: no lesions              Pap taken: No. Bimanual Exam:  Uterus:  normal size, contour, position, consistency, mobility, non-tender              Adnexa: no mass, fullness, tenderness           Chaperone was present for exam.  Assessment:   Well woman visit with normal exam. BMI 51.5. Hyperprolactinemia. Endocrinology managing.  Pituitary adenoma.  Hx abnormal uterine bleeding.  Hx elevated iron and ferritin.  CAD - father deceased from MI age 29.   Plan: Mammogram screening age 89. Self breast awareness reviewed. Pap and HR HPV as above. Guidelines for Calcium, Vitamin D, regular exercise program including cardiovascular and weight bearing exercise. Continue Aygestin 5 mg po bid.  Continue PNV.  Flu vaccine recommended.  CBC, iron, ferritin.  Routine labs with PCP.  Referral to Dr. Leafy Ro.  Patient will follow up when ready to start pregnancy planning.  Follow up annually and prn.   After visit summary provided.

## 2018-08-23 ENCOUNTER — Encounter: Payer: Self-pay | Admitting: Obstetrics and Gynecology

## 2018-08-23 ENCOUNTER — Ambulatory Visit (INDEPENDENT_AMBULATORY_CARE_PROVIDER_SITE_OTHER): Payer: Managed Care, Other (non HMO) | Admitting: Obstetrics and Gynecology

## 2018-08-23 ENCOUNTER — Other Ambulatory Visit: Payer: Self-pay

## 2018-08-23 ENCOUNTER — Ambulatory Visit: Payer: 59 | Admitting: Physician Assistant

## 2018-08-23 VITALS — BP 122/82 | HR 70 | Temp 96.7°F | Resp 18 | Ht 67.0 in | Wt 329.4 lb

## 2018-08-23 DIAGNOSIS — Z6841 Body Mass Index (BMI) 40.0 and over, adult: Secondary | ICD-10-CM | POA: Diagnosis not present

## 2018-08-23 DIAGNOSIS — Z862 Personal history of diseases of the blood and blood-forming organs and certain disorders involving the immune mechanism: Secondary | ICD-10-CM | POA: Diagnosis not present

## 2018-08-23 DIAGNOSIS — R7989 Other specified abnormal findings of blood chemistry: Secondary | ICD-10-CM

## 2018-08-23 MED ORDER — NORETHINDRONE ACETATE 5 MG PO TABS: 5.0000 mg | ORAL_TABLET | Freq: Two times a day (BID) | ORAL | 3 refills | Status: DC

## 2018-08-23 NOTE — Patient Instructions (Signed)

## 2018-08-24 LAB — CBC
Hematocrit: 40.9 % (ref 34.0–46.6)
Hemoglobin: 12.6 g/dL (ref 11.1–15.9)
MCH: 23.3 pg — ABNORMAL LOW (ref 26.6–33.0)
MCHC: 30.8 g/dL — ABNORMAL LOW (ref 31.5–35.7)
MCV: 76 fL — ABNORMAL LOW (ref 79–97)
Platelets: 484 10*3/uL — ABNORMAL HIGH (ref 150–450)
RBC: 5.4 x10E6/uL — ABNORMAL HIGH (ref 3.77–5.28)
RDW: 14.4 % (ref 11.7–15.4)
WBC: 7.6 10*3/uL (ref 3.4–10.8)

## 2018-08-24 LAB — IRON: Iron: 68 ug/dL (ref 27–159)

## 2018-08-24 LAB — FERRITIN: Ferritin: 203 ng/mL — ABNORMAL HIGH (ref 15–150)

## 2018-08-28 NOTE — Addendum Note (Signed)
Addended by: Yisroel Ramming, Dietrich Pates E on: 08/28/2018 05:31 PM   Modules accepted: Orders

## 2018-09-06 ENCOUNTER — Ambulatory Visit: Payer: Managed Care, Other (non HMO) | Admitting: Physician Assistant

## 2018-09-07 ENCOUNTER — Other Ambulatory Visit: Payer: Self-pay

## 2018-09-07 ENCOUNTER — Encounter: Payer: Self-pay | Admitting: Podiatry

## 2018-09-07 ENCOUNTER — Ambulatory Visit: Payer: Managed Care, Other (non HMO) | Admitting: Podiatry

## 2018-09-07 ENCOUNTER — Ambulatory Visit (INDEPENDENT_AMBULATORY_CARE_PROVIDER_SITE_OTHER): Payer: Managed Care, Other (non HMO)

## 2018-09-07 VITALS — BP 120/80 | HR 81

## 2018-09-07 DIAGNOSIS — M216X9 Other acquired deformities of unspecified foot: Secondary | ICD-10-CM

## 2018-09-07 DIAGNOSIS — M7752 Other enthesopathy of left foot: Secondary | ICD-10-CM

## 2018-09-07 DIAGNOSIS — M7750 Other enthesopathy of unspecified foot: Secondary | ICD-10-CM | POA: Diagnosis not present

## 2018-09-07 DIAGNOSIS — M7732 Calcaneal spur, left foot: Secondary | ICD-10-CM

## 2018-09-07 DIAGNOSIS — M7662 Achilles tendinitis, left leg: Secondary | ICD-10-CM | POA: Diagnosis not present

## 2018-09-07 DIAGNOSIS — M659 Unspecified synovitis and tenosynovitis, unspecified site: Secondary | ICD-10-CM

## 2018-09-07 MED ORDER — METHYLPREDNISOLONE 4 MG PO TBPK: ORAL_TABLET | ORAL | 0 refills | Status: DC

## 2018-09-07 MED ORDER — MELOXICAM 15 MG PO TABS: 15.0000 mg | ORAL_TABLET | Freq: Every day | ORAL | 0 refills | Status: DC

## 2018-09-07 NOTE — Patient Instructions (Signed)
Achilles Tendinitis  Achilles tendinitis is inflammation of the tough, cord-like band that attaches the lower leg muscles to the heel bone (Achilles tendon). This is usually caused by overusing the tendon and the ankle joint. Achilles tendinitis usually gets better over time with treatment and caring for yourself at home. It can take weeks or months to heal completely. What are the causes? This condition may be caused by:  A sudden increase in exercise or activity, such as running.  Doing the same exercises or activities (such as jumping) over and over.  Not warming up calf muscles before exercising.  Exercising in shoes that are worn out or not made for exercise.  Having arthritis or a bone growth (spur) on the back of the heel bone. This can rub against the tendon and hurt it.  Age-related wear and tear. Tendons become less flexible with age and more likely to be injured. What are the signs or symptoms? Common symptoms of this condition include:  Pain in the Achilles tendon or in the back of the leg, just above the heel. The pain usually gets worse with exercise.  Stiffness or soreness in the back of the leg, especially in the morning.  Swelling of the skin over the Achilles tendon.  Thickening of the tendon.  Bone spurs at the bottom of the Achilles tendon, near the heel.  Trouble standing on tiptoe. How is this diagnosed? This condition is diagnosed based on your symptoms and a physical exam. You may have tests, including:  X-rays.  MRI. How is this treated? The goal of treatment is to relieve symptoms and help your injury heal. Treatment may include:  Decreasing or stopping activities that caused the tendinitis. This may mean switching to low-impact exercises like biking or swimming.  Icing the injured area.  Doing physical therapy, including strengthening and stretching exercises.  NSAIDs to help relieve pain and swelling.  Using supportive shoes, wraps, heel  lifts, or a walking boot (air cast).  Surgery. This may be done if your symptoms do not improve after 6 months.  Using high-energy shock wave impulses to stimulate the healing process (extracorporeal shock wave therapy). This is rare.  Injection of medicines to help relieve inflammation (corticosteroids). This is rare. Follow these instructions at home: If you have an air cast:  Wear the cast as told by your health care provider. Remove it only as told by your health care provider.  Loosen the cast if your toes tingle, become numb, or turn cold and blue. Activity  Gradually return to your normal activities once your health care provider approves. Do not do activities that cause pain. ? Consider doing low-impact exercises, like cycling or swimming.  If you have an air cast, ask your health care provider when it is safe for you to drive.  If physical therapy was prescribed, do exercises as told by your health care provider or physical therapist. Managing pain, stiffness, and swelling   Raise (elevate) your foot above the level of your heart while you are sitting or lying down.  Move your toes often to avoid stiffness and to lessen swelling.  If directed, put ice on the injured area: ? Put ice in a plastic bag. ? Place a towel between your skin and the bag. ? Leave the ice on for 20 minutes, 2-3 times a day General instructions  If directed, wrap your foot with an elastic bandage or other wrap. This can help keep your tendon from moving too much while it   heals. Your health care provider will show you how to wrap your foot correctly.  Wear supportive shoes or heel lifts only as told by your health care provider.  Take over-the-counter and prescription medicines only as told by your health care provider.  Keep all follow-up visits as told by your health care provider. This is important. Contact a health care provider if:  You have symptoms that gets worse.  You have pain that  does not get better with medicine.  You develop new, unexplained symptoms.  You develop warmth and swelling in your foot.  You have a fever. Get help right away if:  You have a sudden popping sound or sensation in your Achilles tendon followed by severe pain.  You cannot move your toes or foot.  You cannot put any weight on your foot. Summary  Achilles tendinitis is inflammation of the tough, cord-like band that attaches the lower leg muscles to the heel bone (Achilles tendon).  This condition is usually caused by overusing the tendon and the ankle joint. It can also be caused by arthritis or normal aging.  The most common symptoms of this condition include pain, swelling, or stiffness in the Achilles tendon or in the back of the leg.  This condition is usually treated with rest, NSAIDs, and physical therapy. This information is not intended to replace advice given to you by your health care provider. Make sure you discuss any questions you have with your health care provider. Document Released: 09/30/2004 Document Revised: 12/03/2016 Document Reviewed: 11/10/2015 Elsevier Patient Education  2020 Elsevier Inc.  

## 2018-09-10 NOTE — Progress Notes (Signed)
Subjective:  Patient ID: Ana Padilla, female    DOB: Jun 16, 1985,  MRN: [id number]  Chief Complaint  Patient presents with  . Foot Pain    left heel pain x 3 months - works at [employer], stands on concrete all day    33 y.o. female presents with the above complaint. Has tried stretching and icing.    Review of Systems: Negative except as noted in the HPI. Denies N/V/F/Ch.  Past Medical History:  Diagnosis Date  . 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 Anemia   . IBS (irritable bowel syndrome)   . Seasonal allergies   . Sickle cell trait ([facility])     Current Outpatient Medications:  .  acetaminophen (TYLENOL) 500 MG tablet, Take 1 tablet (500 mg total) by mouth every 6 (six) hours as needed for mild pain or moderate pain., Disp: 30 tablet, Rfl: 0 .  cabergoline (DOSTINEX) 0.5 MG tablet, Take 0.5 tablets (0.25 mg total) by mouth once a week., Disp: 10 tablet, Rfl: 2 .  cetirizine (ZYRTEC) 10 MG tablet, Take 10 mg by mouth daily. , Disp: , Rfl:  .  colestipol (COLESTID) 1 g tablet, Take 2 tablets (2 g total) by mouth 2 (two) times daily. Patient must keep upcoming office visit for further refills!!!, Disp: 120 tablet, Rfl: 2 .  fluticasone (FLONASE) 50 MCG/ACT nasal spray, Place 2 sprays into both nostrils daily., Disp: , Rfl:  .  Iron-Vitamins (S.S.S. TONIC PO), Take 1 tablet by mouth daily., Disp: , Rfl:  .  meloxicam (MOBIC) 15 MG tablet, Take 1 tablet (15 mg total) by mouth daily. Start day after completing steroid pack., Disp: 30 tablet, Rfl: 0 .  methylPREDNISolone (MEDROL DOSEPAK) 4 MG TBPK tablet, 6 Day Taper Pack. Take as Directed., Disp: 21 tablet, Rfl: 0 .  norethindrone (AYGESTIN) 5 MG tablet, Take 1 tablet (5 mg total) by mouth 2 (two) times daily., Disp: 180 tablet, Rfl: 3 .  Prenatal Vit-Fe Fumarate-FA (PRENATAL VITAMIN PO), Take 1 tablet by mouth daily., Disp: , Rfl:   Social History   Tobacco Use  Smoking Status Never Smoker  Smokeless Tobacco Never Used     Allergies  Allergen Reactions  . Food Other (See Comments)    Seasonal Allergies & fresh fruit---itchy mouth&nose   . Pollinex-T [Modified Tree Tyrosine Adsorbate]     Itchy nose and eyes, ears, runny nose,    Objective:   Vitals:   09/07/18 1528  BP: 120/80  Pulse: 81   There is no height or weight on file to calculate BMI. Constitutional Well developed. Well nourished.  Vascular Dorsalis pedis pulses palpable bilaterally. Posterior tibial pulses palpable bilaterally. Capillary refill normal to all digits.  No cyanosis or clubbing noted. Pedal hair growth normal.  Neurologic Normal speech. Oriented to person, place, and time. Epicritic sensation to light touch grossly present bilaterally.  Dermatologic Nails well groomed and normal in appearance. No open wounds. No skin lesions.  Orthopedic: Normal joint ROM without pain or crepitus bilaterally. No visible deformities. Tender to palpation at the posterior calcaneus left. No pain with calcaneal squeeze bilaterally. Ankle ROM diminished range with pain left. Silfverskiold Test: positive left.   Radiographs: Taken and reviewed. No acute fractures or dislocations. No evidence of stress fracture.  Plantar heel spur absent. Posterior heel spur present.   Assessment:   1. Tendonitis of ankle or foot   2. Calcaneal spur of left foot   3. Achilles tendinitis of left lower extremity   4. Equinus  deformity of foot   5. Synovitis    Plan:  Patient was evaluated and treated and all questions answered.  Achilles Tendonitis, left -XR reviewed as above. -Educated on stretching and icing of the affected limb. -Night splint dispensed. -eRx for Medrol 6-day taper. Advised on how to take medication. -eRx for Meloxicam. Advised only to take upon completion of oral steroid taper.   Return in about 6 weeks (around 10/19/2018) for Tendonitis, Left.

## 2018-09-20 ENCOUNTER — Telehealth: Payer: Self-pay | Admitting: Podiatry

## 2018-09-20 ENCOUNTER — Telehealth: Payer: Self-pay | Admitting: Obstetrics and Gynecology

## 2018-09-20 MED ORDER — MELOXICAM 15 MG PO TABS: 15.0000 mg | ORAL_TABLET | Freq: Every day | ORAL | 0 refills | Status: DC

## 2018-09-20 NOTE — Telephone Encounter (Signed)
Left message informing pt the refill of meloxicam.

## 2018-09-20 NOTE — Telephone Encounter (Signed)
Patient called back. Pharmacy has prescription on file.

## 2018-09-20 NOTE — Addendum Note (Signed)
Addended by: Harriett Sine D on: 09/20/2018 04:24 PM   Modules accepted: Orders

## 2018-09-20 NOTE — Telephone Encounter (Signed)
Pt accidentally thew away her Meloxicam and wanted to know could she get another refill. Please call patient.

## 2018-09-20 NOTE — Telephone Encounter (Signed)
Patient is calling regarding refill for Aygestin 5MG  tablet. Patient stated that she called [facility] in [place] and they do not have a prescription on file.

## 2018-09-21 ENCOUNTER — Telehealth: Payer: Self-pay | Admitting: Podiatry

## 2018-09-21 ENCOUNTER — Ambulatory Visit: Payer: Managed Care, Other (non HMO) | Admitting: Physician Assistant

## 2018-09-21 ENCOUNTER — Encounter: Payer: Self-pay | Admitting: Physician Assistant

## 2018-09-21 VITALS — BP 120/86 | HR 82 | Temp 99.4°F | Ht 67.0 in | Wt 332.0 lb

## 2018-09-21 DIAGNOSIS — K9089 Other intestinal malabsorption: Secondary | ICD-10-CM | POA: Diagnosis not present

## 2018-09-21 MED ORDER — COLESTIPOL HCL 1 G PO TABS: 1.0000 g | ORAL_TABLET | Freq: Every day | ORAL | 4 refills | Status: DC

## 2018-09-21 NOTE — Progress Notes (Signed)
Subjective:    Patient ID: Ana Padilla, female    DOB: 1985-11-14, 33 y.o.   MRN: [id number]  HPI Ana Padilla is a pleasant 33 year old African-American female, known to Dr. Yancey Flemings who comes in today for refill of Colestid.  Patient was last seen in February 2019 with IBS type symptoms having up to 6-7 bowel movements per day.  She is status post cholecystectomy in 2016.  She also has history of pituitary adenoma and hyper prolactinemia as well as sickle cell trait.  She has had prior problems with iron deficiency anemia secondary to menorrhagia, and morbid obesity. Patient says that the Colestid works very well and she is only requiring 1 tablet daily for good control of her symptoms.  As long as she stays on the Colestid she is having no problems with diarrhea or urgency.   Review of Systems Pertinent positive and negative review of systems were noted in the above HPI section.  All other review of systems was otherwise negative.  Outpatient Encounter Medications as of 09/21/2018  Medication Sig  . acetaminophen (TYLENOL) 500 MG tablet Take 1 tablet (500 mg total) by mouth every 6 (six) hours as needed for mild pain or moderate pain.  . cabergoline (DOSTINEX) 0.5 MG tablet Take 0.5 tablets (0.25 mg total) by mouth once a week.  . cetirizine (ZYRTEC) 10 MG tablet Take 10 mg by mouth daily.   . colestipol (COLESTID) 1 g tablet Take 1 tablet (1 g total) by mouth daily. Patient must keep upcoming office visit for further refills!!!  . fluticasone (FLONASE) 50 MCG/ACT nasal spray Place 2 sprays into both nostrils daily.  . Iron-Vitamins (S.S.S. TONIC PO) Take 1 tablet by mouth daily.  . meloxicam (MOBIC) 15 MG tablet Take 1 tablet (15 mg total) by mouth daily. Start day after completing steroid pack.  . methylPREDNISolone (MEDROL DOSEPAK) 4 MG TBPK tablet 6 Day Taper Pack. Take as Directed.  . norethindrone (AYGESTIN) 5 MG tablet Take 1 tablet (5 mg total) by mouth 2 (two) times daily.  .  Prenatal Vit-Fe Fumarate-FA (PRENATAL VITAMIN PO) Take 1 tablet by mouth daily.  . [DISCONTINUED] colestipol (COLESTID) 1 g tablet Take 2 tablets (2 g total) by mouth 2 (two) times daily. Patient must keep upcoming office visit for further refills!!!   No facility-administered encounter medications on file as of 09/21/2018.    Allergies  Allergen Reactions  . Food Other (See Comments)    Seasonal Allergies & fresh fruit---itchy mouth&nose   . Pollinex-T [Modified Tree Tyrosine Adsorbate]     Itchy nose and eyes, ears, runny nose,    Patient Active Problem List   Diagnosis Date Noted  . Dysfunction of left eustachian tube 05/25/2018  . Cervical polyp 04/28/2017  . Morbid obesity ([facility]) 04/28/2017  . Hyperprolactinemia ([facility]) 02/11/2017  . Pituitary adenoma ([facility]) 02/11/2017  . Sickle cell trait ([facility]) 02/01/2017  . Iron deficiency anemia due to chronic blood loss 11/04/2016   Social History   Socioeconomic History  . Marital status: Single    Spouse name: Not on file  . Number of children: 0  . Years of education: Not on file  . Highest education level: Not on file  Occupational History  . Occupation: Corporate treasurer  Social Needs  . Financial resource strain: Not on file  . Food insecurity    Worry: Not on file    Inability: Not on file  . Transportation needs    Medical: Not on file  Non-medical: Not on file  Tobacco Use  . Smoking status: Never Smoker  . Smokeless tobacco: Never Used  Substance and Sexual Activity  . Alcohol use: Yes    Alcohol/week: 2.0 standard drinks    Types: 2 Glasses of wine per week  . Drug use: No  . Sexual activity: Yes    Birth control/protection: Condom  Lifestyle  . Physical activity    Days per week: Not on file    Minutes per session: Not on file  . Stress: Not on file  Relationships  . Social Musician on phone: Not on file    Gets together: Not on file    Attends religious service: Not on file    Active  member of club or organization: Not on file    Attends meetings of clubs or organizations: Not on file    Relationship status: Not on file  . Intimate partner violence    Fear of current or ex partner: Not on file    Emotionally abused: Not on file    Physically abused: Not on file    Forced sexual activity: Not on file  Other Topics Concern  . Not on file  Social History Narrative  . Not on file    Ana Padilla's family history includes Heart disease in her father; Sickle cell trait in her mother. She was adopted.      Objective:    Vitals:   09/21/18 1143  BP: 120/86  Pulse: 82  Temp: 99.4 F (37.4 C)    Physical Exam Well-developed well-nourished female  in no acute distress.  Height, Weight,332 BMI 52  HEENT; nontraumatic normocephalic, EOMI, PER R LA, sclera anicteric. 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 She has had excellent results with Colestid, currently just requiring 1 g/day with good control of symptoms.  #2 morbid obesity #3.  Sickle cell trait #4.  History of pituitary adenoma #5.  History of hyperprolactinemia  Plan; refill Colestid at 1 g p.o. daily. Patient will follow-up with Dr. Marina Goodell or myself in 1 year   or sooner as needed.  Ana Padilla Oswald Hillock PA-C 09/21/2018   Cc: Deeann Saint, MD

## 2018-09-21 NOTE — Telephone Encounter (Signed)
Pt called requesting a refill on her Medrol Dosepak.  Please send to [facility] on [address].

## 2018-09-21 NOTE — Patient Instructions (Addendum)
We have sent in your prescription to your pharmacy ( Colestid)  Follow up as needed  If you are age 31 or older, your body mass index should be between 23-30. Your Body mass index is 52 kg/m. If this is out of the aforementioned range listed, please consider follow up with your Primary Care Provider.  If you are age 36 or younger, your body mass index should be between 19-25. Your Body mass index is 52 kg/m. If this is out of the aformentioned range listed, please consider follow up with your Primary Care Provider.

## 2018-09-21 NOTE — Telephone Encounter (Signed)
Left message informing Dr. Karolee Ohs stated the medrol dose pack can not be refilled at this time, she is to take the meloxicam ordered today for inflammation and pain.

## 2018-09-22 NOTE — Progress Notes (Signed)
Noted  

## 2018-10-17 ENCOUNTER — Ambulatory Visit: Payer: Managed Care, Other (non HMO) | Admitting: Podiatry

## 2018-10-17 ENCOUNTER — Other Ambulatory Visit: Payer: Self-pay

## 2018-10-17 DIAGNOSIS — M7750 Other enthesopathy of unspecified foot: Secondary | ICD-10-CM

## 2018-10-17 DIAGNOSIS — M7662 Achilles tendinitis, left leg: Secondary | ICD-10-CM

## 2018-10-17 DIAGNOSIS — M7732 Calcaneal spur, left foot: Secondary | ICD-10-CM | POA: Diagnosis not present

## 2018-10-24 ENCOUNTER — Ambulatory Visit: Payer: Managed Care, Other (non HMO) | Admitting: Obstetrics and Gynecology

## 2018-10-24 ENCOUNTER — Telehealth: Payer: Self-pay | Admitting: Obstetrics and Gynecology

## 2018-10-24 NOTE — Telephone Encounter (Signed)
Patient cancelled this morning's appointment less than 24 hours. States her babysitter fell through and she also figured out the reason for her abnormal bleeding. She realized she had not taken her aygestin - had ibuprofen in similar bottle next to aygestin and she took that by mistake.

## 2018-10-25 NOTE — Telephone Encounter (Signed)
Encounter reviewed and closed.  

## 2018-11-03 ENCOUNTER — Telehealth: Payer: Self-pay | Admitting: Podiatry

## 2018-11-03 NOTE — Telephone Encounter (Signed)
Pt called requesting a refill on her Medrol Dosepak. Please send to [facility] on [address]

## 2018-11-07 MED ORDER — METHYLPREDNISOLONE 4 MG PO TBPK: ORAL_TABLET | ORAL | 0 refills | Status: DC

## 2018-11-07 NOTE — Telephone Encounter (Signed)
Left message informing pt the medrol dose pack was at the [facility].

## 2018-11-07 NOTE — Addendum Note (Signed)
Addended by: Hardie Pulley on: 11/07/2018 07:37 AM   Modules accepted: Orders

## 2018-11-07 NOTE — Telephone Encounter (Signed)
Sent please inform 

## 2018-11-10 ENCOUNTER — Ambulatory Visit: Payer: 59 | Admitting: Internal Medicine

## 2018-12-08 ENCOUNTER — Other Ambulatory Visit: Payer: Self-pay

## 2018-12-08 ENCOUNTER — Ambulatory Visit: Payer: Managed Care, Other (non HMO) | Admitting: Podiatry

## 2018-12-08 DIAGNOSIS — M7750 Other enthesopathy of unspecified foot: Secondary | ICD-10-CM

## 2018-12-08 DIAGNOSIS — M216X9 Other acquired deformities of unspecified foot: Secondary | ICD-10-CM | POA: Diagnosis not present

## 2018-12-08 DIAGNOSIS — M7732 Calcaneal spur, left foot: Secondary | ICD-10-CM

## 2018-12-08 MED ORDER — MELOXICAM 15 MG PO TABS: 15.0000 mg | ORAL_TABLET | Freq: Every day | ORAL | 0 refills | Status: DC

## 2018-12-08 NOTE — Progress Notes (Addendum)
Subjective:  Patient ID: Ana Padilla, female    DOB: 1985/08/05,  MRN: [id number]  Chief Complaint  Patient presents with  . Tendonitis    F/U Lt tendonitis Pt. states,' no relife with anything. Pain is still there the same.; 8/10 sharp constant pians." -pt denies swelling/N/V/F?Ch Tx: NS, stretching, meloxicam and OTC topical pain rubs    33 y.o. female presents with the above complaint. Has tried stretching and icing. Didn't pick up her medrol pack   Review of Systems: Negative except as noted in the HPI. Denies N/V/F/Ch.  Past Medical History:  Diagnosis Date  . 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 Anemia   . IBS (irritable bowel syndrome)   . Seasonal allergies   . Sickle cell trait (HCC)     Current Outpatient Medications:  .  acetaminophen (TYLENOL) 500 MG tablet, Take 1 tablet (500 mg total) by mouth every 6 (six) hours as needed for mild pain or moderate pain., Disp: 30 tablet, Rfl: 0 .  cabergoline (DOSTINEX) 0.5 MG tablet, Take 0.5 tablets (0.25 mg total) by mouth once a week., Disp: 10 tablet, Rfl: 2 .  cetirizine (ZYRTEC) 10 MG tablet, Take 10 mg by mouth daily. , Disp: , Rfl:  .  colestipol (COLESTID) 1 g tablet, Take 1 tablet (1 g total) by mouth daily. Patient must keep upcoming office visit for further refills!!!, Disp: 90 tablet, Rfl: 4 .  fluticasone (FLONASE) 50 MCG/ACT nasal spray, Place 2 sprays into both nostrils daily., Disp: , Rfl:  .  Iron-Vitamins (S.S.S. TONIC PO), Take 1 tablet by mouth daily., Disp: , Rfl:  .  meloxicam (MOBIC) 15 MG tablet, Take 1 tablet (15 mg total) by mouth daily. Start day after completing steroid pack., Disp: 30 tablet, Rfl: 0 .  methylPREDNISolone (MEDROL DOSEPAK) 4 MG TBPK tablet, 6 Day Taper Pack. Take as Directed., Disp: 21 tablet, Rfl: 0 .  norethindrone (AYGESTIN) 5 MG tablet, Take 1 tablet (5 mg total) by mouth 2 (two) times daily., Disp: 180 tablet, Rfl: 3 .  Prenatal Vit-Fe Fumarate-FA (PRENATAL VITAMIN PO), Take 1  tablet by mouth daily., Disp: , Rfl:   Social History   Tobacco Use  Smoking Status Never Smoker  Smokeless Tobacco Never Used    Allergies  Allergen Reactions  . Food Other (See Comments)    Seasonal Allergies & fresh fruit---itchy mouth&nose   . Pollinex-T [Modified Tree Tyrosine Adsorbate]     Itchy nose and eyes, ears, runny nose,    Objective:   There were no vitals filed for this visit. There is no height or weight on file to calculate BMI. Constitutional Well developed. Well nourished.  Vascular Dorsalis pedis pulses palpable bilaterally. Posterior tibial pulses palpable bilaterally. Capillary refill normal to all digits.  No cyanosis or clubbing noted. Pedal hair growth normal.  Neurologic Normal speech. Oriented to person, place, and time. Epicritic sensation to light touch grossly present bilaterally.  Dermatologic Nails well groomed and normal in appearance. No open wounds. No skin lesions.  Orthopedic: Normal joint ROM without pain or crepitus bilaterally. No visible deformities. Tender to palpation at the posterior calcaneus left. No pain with calcaneal squeeze bilaterally. Ankle ROM diminished range with pain left. Silfverskiold Test: positive left.    Assessment:   1. Tendonitis of ankle or foot   2. Calcaneal spur of left foot   3. Achilles tendinitis of left lower extremity    Plan:  Patient was evaluated and treated and all questions answered.  Achilles  Tendonitis, left -No injection today -Continue stretching and icing.  -Consider PT -Rx medrol pack  No follow-ups on file.

## 2018-12-08 NOTE — Progress Notes (Signed)
Subjective:  Patient ID: Ana Padilla, female    DOB: 10-02-1985,  MRN: [id number]  Chief Complaint  Patient presents with  . Follow-up    left heel pain , has no improvement - pt does state she wears brace, but she walks a lot which causes more pain     33 y.o. female presents with the above complaint. Has tried stretching and icing. Didn't pick up her medrol pack   Review of Systems: Negative except as noted in the HPI. Denies N/V/F/Ch.  Past Medical History:  Diagnosis Date  . 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 Anemia   . IBS (irritable bowel syndrome)   . Seasonal allergies   . Sickle cell trait (HCC)     Current Outpatient Medications:  .  acetaminophen (TYLENOL) 500 MG tablet, Take 1 tablet (500 mg total) by mouth every 6 (six) hours as needed for mild pain or moderate pain., Disp: 30 tablet, Rfl: 0 .  cabergoline (DOSTINEX) 0.5 MG tablet, Take 0.5 tablets (0.25 mg total) by mouth once a week., Disp: 10 tablet, Rfl: 2 .  cetirizine (ZYRTEC) 10 MG tablet, Take 10 mg by mouth daily. , Disp: , Rfl:  .  colestipol (COLESTID) 1 g tablet, Take 1 tablet (1 g total) by mouth daily. Patient must keep upcoming office visit for further refills!!!, Disp: 90 tablet, Rfl: 4 .  fluticasone (FLONASE) 50 MCG/ACT nasal spray, Place 2 sprays into both nostrils daily., Disp: , Rfl:  .  Iron-Vitamins (S.S.S. TONIC PO), Take 1 tablet by mouth daily., Disp: , Rfl:  .  methylPREDNISolone (MEDROL DOSEPAK) 4 MG TBPK tablet, 6 Day Taper Pack. Take as Directed., Disp: 21 tablet, Rfl: 0 .  norethindrone (AYGESTIN) 5 MG tablet, Take 1 tablet (5 mg total) by mouth 2 (two) times daily., Disp: 180 tablet, Rfl: 3 .  Prenatal Vit-Fe Fumarate-FA (PRENATAL VITAMIN PO), Take 1 tablet by mouth daily., Disp: , Rfl:  .  meloxicam (MOBIC) 15 MG tablet, Take 1 tablet (15 mg total) by mouth daily. Start day after completing steroid pack., Disp: 30 tablet, Rfl: 0  Social History   Tobacco Use  Smoking Status  Never Smoker  Smokeless Tobacco Never Used    Allergies  Allergen Reactions  . Food Other (See Comments)    Seasonal Allergies & fresh fruit---itchy mouth&nose   . Pollinex-T [Modified Tree Tyrosine Adsorbate]     Itchy nose and eyes, ears, runny nose,    Objective:   There were no vitals filed for this visit. There is no height or weight on file to calculate BMI. Constitutional Well developed. Well nourished.  Vascular Dorsalis pedis pulses palpable bilaterally. Posterior tibial pulses palpable bilaterally. Capillary refill normal to all digits.  No cyanosis or clubbing noted. Pedal hair growth normal.  Neurologic Normal speech. Oriented to person, place, and time. Epicritic sensation to light touch grossly present bilaterally.  Dermatologic Nails well groomed and normal in appearance. No open wounds. No skin lesions.  Orthopedic: Normal joint ROM without pain or crepitus bilaterally. No visible deformities. Tender to palpation at the posterior calcaneus left. No pain with calcaneal squeeze bilaterally. Ankle ROM diminished range with pain left. Silfverskiold Test: positive left.    Assessment:   1. Equinus deformity of foot   2. Tendonitis of ankle or foot   3. Calcaneal spur of left foot    Plan:  Patient was evaluated and treated and all questions answered.  Achilles Tendonitis, left -Rx Meloxicam -Start PT -Consider gastroc recession  should pain persist  Return in about 6 weeks (around 01/19/2019) for Tendonitis.

## 2018-12-11 ENCOUNTER — Telehealth: Payer: Self-pay | Admitting: *Deleted

## 2018-12-11 DIAGNOSIS — M7662 Achilles tendinitis, left leg: Secondary | ICD-10-CM

## 2018-12-11 NOTE — Telephone Encounter (Signed)
[employer] - In-office hand delivered.

## 2018-12-25 ENCOUNTER — Telehealth: Payer: Self-pay | Admitting: Obstetrics and Gynecology

## 2018-12-25 DIAGNOSIS — Z862 Personal history of diseases of the blood and blood-forming organs and certain disorders involving the immune mechanism: Secondary | ICD-10-CM

## 2018-12-25 DIAGNOSIS — R7989 Other specified abnormal findings of blood chemistry: Secondary | ICD-10-CM

## 2018-12-25 NOTE — Telephone Encounter (Signed)
Please contact patient regarding follow up of her iron and ferritin.  Her ferritin level was high in August, and she came up in my reminder bo in Epic to have this rechecked.   I think she is overdue for a well woman visit, and the lab work could be done at the same time.   I hope she is doing well!

## 2018-12-25 NOTE — Telephone Encounter (Signed)
Left message for pt to call back to triage RN.  

## 2019-01-02 NOTE — Telephone Encounter (Signed)
Pt returned call. Pt scheduled with Dr Quincy Simmonds for AEX and labs for iron and ferritin on 12/30 at 4pm, arriving at 345pm.. Pt agreeable. CPS negative. Future orders placed for iron and ferritin levels.   Will route to Dr Quincy Simmonds for review and will close encounter.

## 2019-01-02 NOTE — Telephone Encounter (Signed)
Left message for pt to call back to triage RN to schedule AEX with labs.

## 2019-01-03 ENCOUNTER — Ambulatory Visit: Payer: Self-pay | Admitting: Obstetrics and Gynecology

## 2019-01-08 ENCOUNTER — Other Ambulatory Visit: Payer: Self-pay

## 2019-01-10 ENCOUNTER — Encounter: Payer: Self-pay | Admitting: Obstetrics and Gynecology

## 2019-01-10 ENCOUNTER — Other Ambulatory Visit: Payer: Self-pay

## 2019-01-10 ENCOUNTER — Ambulatory Visit: Payer: Managed Care, Other (non HMO) | Admitting: Obstetrics and Gynecology

## 2019-01-10 VITALS — BP 120/76 | HR 76 | Temp 96.0°F | Ht 67.0 in | Wt 339.2 lb

## 2019-01-10 DIAGNOSIS — Z862 Personal history of diseases of the blood and blood-forming organs and certain disorders involving the immune mechanism: Secondary | ICD-10-CM

## 2019-01-10 DIAGNOSIS — Z8742 Personal history of other diseases of the female genital tract: Secondary | ICD-10-CM | POA: Diagnosis not present

## 2019-01-10 MED ORDER — LEVONORGEST-ETH ESTRAD 91-DAY 0.15-0.03 MG PO TABS: 1.0000 | ORAL_TABLET | Freq: Every day | ORAL | 1 refills | Status: DC

## 2019-01-10 NOTE — Progress Notes (Signed)
GYNECOLOGY  VISIT   HPI: 34 y.o.   Single  African American  female   [id number] with No LMP recorded. (Menstrual status: Irregular Periods).   here for medication follow up and labs today.   She states she has decreased Aygestin to 7.90m daily from 10mg  daily. No vaginal bleeding.  She was feeling very emotional on Aygestin 10 mg.  She has gained 15 pounds.  Really motivated to do weight loss and get healthy for pregnancy.   She had irregular bleeding with combined oral contraception.  She tried a lot of combined oral contraceptives.  Seasonique, Seasonale, LoEstrin, Alesse, Mircette, Ortho Tricyclen, Ortho Novum.  She needs a CBC, iron and ferritin check.  Doing PT for tendinitis.   Has seen a dietician in the past.   Starting school next week.   GYNECOLOGIC HISTORY: No LMP recorded. (Menstrual status: Irregular Periods). Contraception: None Menopausal hormone therapy:  Aygestin 7.5mg  daily Last mammogram:  n/a Last pap smear:  04-28-17 Neg:Neg HR HPV, 2017 normal        OB History    Gravida  0   Para  0   Term  0   Preterm  0   AB  0   Living  0     SAB  0   TAB  0   Ectopic  0   Multiple  0   Live Births  0              Patient Active Problem List   Diagnosis Date Noted  . Dysfunction of left eustachian tube 05/25/2018  . Cervical polyp 04/28/2017  . Morbid obesity ([place]) 04/28/2017  . Hyperprolactinemia ([place]) 02/11/2017  . Pituitary adenoma ([place]) 02/11/2017  . Sickle cell trait ([place]) 02/01/2017  . Iron deficiency anemia due to chronic blood loss 11/04/2016    Past Medical History:  Diagnosis Date  . Adenoma of pituitary ([place]) 08/2014  . Anemia   . IBS (irritable bowel syndrome)   . Seasonal allergies   . Sickle cell trait [facility])     Past Surgical History:  Procedure Laterality Date  . BLEPHAROPLASTY Right    lower lid x 3  . CHOLECYSTECTOMY  2016  . ELECTROLYSIS OF MISDIRECTED LASHES Right 08/04/2017   Procedure: ELECTROLYSIS OF  MISDIRECTED LASHES RIGHT EYE;  Surgeon: Clista Bernhardt, MD;  Location: [place];  Service: Ophthalmology;  Laterality: Right;  . ENTROPIAN REPAIR Right 08/04/2017   Procedure: REPAIR OF ENTROPION WITH TARSAL STRIP RIGHT EYE;  Surgeon: Clista Bernhardt, MD;  Location: [place];  Service: Ophthalmology;  Laterality: Right;  . HYSTEROSCOPY WITH D & C N/A 06/27/2017   Procedure: DILATATION & CURETTAGE/HYSTEROSCOPY;  Surgeon: Nunzio Cobbs, MD;  Location: [facility];  Service: Gynecology;  Laterality: N/A;  . iud placement     been removed    Current Outpatient Medications  Medication Sig Dispense Refill  . acetaminophen (TYLENOL) 500 MG tablet Take 1 tablet (500 mg total) by mouth every 6 (six) hours as needed for mild pain or moderate pain. 30 tablet 0  . cabergoline (DOSTINEX) 0.5 MG tablet Take 0.5 tablets (0.25 mg total) by mouth once a week. 10 tablet 2  . cetirizine (ZYRTEC) 10 MG tablet Take 10 mg by mouth daily.     . colestipol (COLESTID) 1 g tablet Take 1 tablet (1 g total) by mouth daily. Patient must keep upcoming office visit for further refills!!! 90 tablet 4  . fluticasone (FLONASE) 50 MCG/ACT nasal  spray Place 2 sprays into both nostrils daily.    . meloxicam (MOBIC) 15 MG tablet Take 1 tablet (15 mg total) by mouth daily. Start day after completing steroid pack. 30 tablet 0  . norethindrone (AYGESTIN) 5 MG tablet Take 1 tablet (5 mg total) by mouth 2 (two) times daily. 180 tablet 3  . Prenatal Vit-Fe Fumarate-FA (PRENATAL VITAMIN PO) Take 1 tablet by mouth daily.     No current facility-administered medications for this visit.     ALLERGIES: Food and Pollinex-t [modified tree tyrosine adsorbate]  Family History  Adopted: Yes  Problem Relation Age of Onset  . Sickle cell trait Mother   . Heart disease Father     Social History   Socioeconomic History  . Marital status: Single    Spouse name: Not on file  . Number of children: 0  . Years of education: Not on file   . Highest education level: Not on file  Occupational History  . 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 Smoking status: Never Smoker  . Smokeless tobacco: Never Used  Substance and Sexual Activity  . Alcohol use: Yes    Alcohol/week: 2.0 standard drinks    Types: 2 Glasses of wine per week  . Drug use: No  . Sexual activity: Yes    Birth control/protection: Condom  Other Topics Concern  . Not on file  Social History Narrative  . Not on file   Social Determinants of Health   Financial Resource Strain:   . Difficulty of Paying Living Expenses: Not on file  Food Insecurity:   . Worried About Charity fundraiser in the Last Year: Not on file  . Ran Out of Food in the Last Year: Not on file  Transportation Needs:   . Lack of Transportation (Medical): Not on file  . Lack of Transportation (Non-Medical): Not on file  Physical Activity:   . Days of Exercise per Week: Not on file  . Minutes of Exercise per Session: Not on file  Stress:   . Feeling of Stress : Not on file  Social Connections:   . Frequency of Communication with Friends and Family: Not on file  . Frequency of Social Gatherings with Friends and Family: Not on file  . Attends Religious Services: Not on file  . Active Member of Clubs or Organizations: Not on file  . Attends Archivist Meetings: Not on file  . Marital Status: Not on file  Intimate Partner Violence:   . Fear of Current or Ex-Partner: Not on file  . Emotionally Abused: Not on file  . Physically Abused: Not on file  . Sexually Abused: Not on file    Review of Systems  All other systems reviewed and are negative.   PHYSICAL EXAMINATION:    BP 120/76 (Cuff Size: Large)   Pulse 76   Temp (!) 96 F (35.6 C) (Temporal)   Ht 5\' 7"  (1.702 m)   Wt (!) 339 lb 3.2 oz (153.9 kg)   BMI 53.13 kg/m     General appearance: alert, cooperative and appears stated age   ASSESSMENT  Dysfunctional uterine bleeding.  On Aygestin.   Hyperprolactinemia.  Iron deficiency anemia.  Sickle cell trait.  Obesity.   PLAN  Check CBC, iron and ferritin.  We discussed weight loss at length through diet and exercise.  We reviewed the benefits for reduction of risk of diabetes, cardiovascular disease and for improving fertility and pregnancy outcome.  She  will consider weight loss surgery if she is not able to do this on her own.  She will stop Aygestin and start Seasonale.  90 day Rx with on refill. Fu in 6 months for annual exam.   An After Visit Summary was printed and given to the patient.  __35____ minutes face to face time of which over 50% was spent in counseling.

## 2019-01-11 LAB — CBC
Hematocrit: 38 % (ref 34.0–46.6)
Hemoglobin: 12.3 g/dL (ref 11.1–15.9)
MCH: 23.9 pg — ABNORMAL LOW (ref 26.6–33.0)
MCHC: 32.4 g/dL (ref 31.5–35.7)
MCV: 74 fL — ABNORMAL LOW (ref 79–97)
Platelets: 407 10*3/uL (ref 150–450)
RBC: 5.14 x10E6/uL (ref 3.77–5.28)
RDW: 14.7 % (ref 11.7–15.4)
WBC: 7.7 10*3/uL (ref 3.4–10.8)

## 2019-01-11 LAB — IRON: Iron: 52 ug/dL (ref 27–159)

## 2019-01-11 LAB — FERRITIN: Ferritin: 186 ng/mL — ABNORMAL HIGH (ref 15–150)

## 2019-01-14 ENCOUNTER — Encounter: Payer: Self-pay | Admitting: Obstetrics and Gynecology

## 2019-01-19 ENCOUNTER — Telehealth: Payer: Self-pay | Admitting: Obstetrics and Gynecology

## 2019-01-19 NOTE — Telephone Encounter (Signed)
Left message for pt to call back to triage RN.  

## 2019-01-19 NOTE — Telephone Encounter (Signed)
Patient started birth control and is spotting. Should she stop taking aygestin?

## 2019-01-22 NOTE — Telephone Encounter (Signed)
Spoke to pt. Pt given information on taking OCPs as directed by Dr Quincy Simmonds. Pt agreeable and verbalized understanding. Pt will call with update.   Will route to Dr Quincy Simmonds for review and will close encounter.

## 2019-01-22 NOTE — Telephone Encounter (Signed)
Pt returned call to office. Pt states started on OCPs Seasonale on 01/14/19. Pt states stopped the aygestin on 01/14/2019. Pt states spotting intermittently. Pt states having tiny clots when wiping only. Not changing pad. Pt wanting advice on what to do from Dr Quincy Simmonds.   Will discuss with Dr Quincy Simmonds and return call to pt. Pt agreeable.

## 2019-01-22 NOTE — Telephone Encounter (Signed)
Have the patient take 2 birth control pills daily for 5 days, and then decrease back to once a day.   She should not take both the Aygestin and the birth control pills.   Thanks.

## 2019-01-24 ENCOUNTER — Telehealth: Payer: Self-pay | Admitting: Obstetrics and Gynecology

## 2019-01-24 NOTE — Telephone Encounter (Signed)
I recommend she start a PNV, as this will help reduce the risk of spina bifida for the baby. After one month, stop her birth control pills.  She needs a flu vaccine if it was not already completed.

## 2019-01-24 NOTE — Telephone Encounter (Signed)
Spoke with pt. Pt states her and husband now wanting to plan a pregnancy. Pt states started taking Seasonale as directed from Dr Quincy Simmonds on 01/10//20 and then  taking 2 tabs a day for 5 days starting on 01/19/19 for spotting  and states still had light spotting. Denies cramps or heavy bleeding, just had continuous light spotting.  Pt now wanting to know what to do to plan pregnancy. Pt advised will have to stop OCPs and give body rest time up to 3 months without hormone to try for pregnancy. Pt agreeable. Will review with Dr Quincy Simmonds and return call to pt with recommendations.   Will route to Dr Quincy Simmonds for any additional recommendations and advice.

## 2019-01-24 NOTE — Telephone Encounter (Signed)
Patient would like to change her birth control back to a previous version. She asked if she will need an appointment? She stated she would be interested in a virtual appointment if available.

## 2019-01-25 NOTE — Telephone Encounter (Signed)
Pt returned call. Spoke to pt. Pt given recommendations from Dr Quincy Simmonds. Pt has PNV bottle at home and will take them. Pt agreeable to getting flu vaccine.  Pt thankful for advice and will keep Dr Quincy Simmonds updated.   Will route to Dr Quincy Simmonds for review and will close encounter.

## 2019-01-25 NOTE — Telephone Encounter (Signed)
Left message for pt to call back to Dru Laurel, RN in triage. 

## 2019-02-01 ENCOUNTER — Ambulatory Visit: Payer: Managed Care, Other (non HMO) | Admitting: Podiatry

## 2019-02-07 ENCOUNTER — Encounter (INDEPENDENT_AMBULATORY_CARE_PROVIDER_SITE_OTHER): Payer: Self-pay

## 2019-02-08 ENCOUNTER — Other Ambulatory Visit: Payer: Self-pay

## 2019-02-08 ENCOUNTER — Ambulatory Visit: Payer: Managed Care, Other (non HMO) | Admitting: Podiatry

## 2019-02-08 ENCOUNTER — Encounter: Payer: Self-pay | Admitting: Podiatry

## 2019-02-08 DIAGNOSIS — M7732 Calcaneal spur, left foot: Secondary | ICD-10-CM

## 2019-02-08 DIAGNOSIS — M216X9 Other acquired deformities of unspecified foot: Secondary | ICD-10-CM

## 2019-02-08 DIAGNOSIS — M7750 Other enthesopathy of unspecified foot: Secondary | ICD-10-CM

## 2019-02-08 DIAGNOSIS — M7662 Achilles tendinitis, left leg: Secondary | ICD-10-CM | POA: Diagnosis not present

## 2019-02-08 DIAGNOSIS — E6609 Other obesity due to excess calories: Secondary | ICD-10-CM

## 2019-02-08 NOTE — Patient Instructions (Signed)
Pre-Operative Instructions  Congratulations, you have decided to take an important step towards improving your quality of life.  You can be assured that the doctors and staff at [facility] will be with you every step of the way.  Here are some important things you should know:  1. Plan to be at the surgery center/hospital at least 1 (one) hour prior to your scheduled time, unless otherwise directed by the surgical center/hospital staff.  You must have a responsible adult accompany you, remain during the surgery and drive you home.  Make sure you have directions to the surgical center/hospital to ensure you arrive on time. 2. If you are having surgery at [facility] or [facility], you will need a copy of your medical history and physical form from your family physician within one month prior to the date of surgery. We will give you a form for your primary physician to complete.  3. We make every effort to accommodate the date you request for surgery.  However, there are times where surgery dates or times have to be moved.  We will contact you as soon as possible if a change in schedule is required.   4. No aspirin/ibuprofen for one week before surgery.  If you are on aspirin, any non-steroidal anti-inflammatory medications (Mobic, Aleve, Ibuprofen) should not be taken seven (7) days prior to your surgery.  You make take Tylenol for pain prior to surgery.  5. Medications - If you are taking daily heart and blood pressure medications, seizure, reflux, allergy, asthma, anxiety, pain or diabetes medications, make sure you notify the surgery center/hospital before the day of surgery so they can tell you which medications you should take or avoid the day of surgery. 6. No food or drink after midnight the night before surgery unless directed otherwise by surgical center/hospital staff. 7. No alcoholic beverages 24-hours prior to surgery.  No smoking 24-hours prior or 24-hours after  surgery. 8. Wear loose pants or shorts. They should be loose enough to fit over bandages, boots, and casts. 9. Don't wear slip-on shoes. Sneakers are preferred. 10. Bring your boot with you to the surgery center/hospital.  Also bring crutches or a walker if your physician has prescribed it for you.  If you do not have this equipment, it will be provided for you after surgery. 11. If you have not been contacted by the surgery center/hospital by the day before your surgery, call to confirm the date and time of your surgery. 12. Leave-time from work may vary depending on the type of surgery you have.  Appropriate arrangements should be made prior to surgery with your employer. 13. Prescriptions will be provided immediately following surgery by your doctor.  Fill these as soon as possible after surgery and take the medication as directed. Pain medications will not be refilled on weekends and must be approved by the doctor. 14. Remove nail polish on the operative foot and avoid getting pedicures prior to surgery. 15. Wash the night before surgery.  The night before surgery wash the foot and leg well with water and the antibacterial soap provided. Be sure to pay special attention to beneath the toenails and in between the toes.  Wash for at least three (3) minutes. Rinse thoroughly with water and dry well with a towel.  Perform this wash unless told not to do so by your physician.  Enclosed: 1 Ice pack (please put in freezer the night before surgery)   1 Hibiclens skin cleaner     Pre-op instructions  If you have any questions regarding the instructions, please do not hesitate to call our office.  [place]: [address], [place], [place] [address] -- [phone]  [place]: [address] [place], [place] [address] -- [phone]  [place]: [address], [place], [place] [address] -- [phone]   Website: [email or web address] 

## 2019-02-09 ENCOUNTER — Telehealth: Payer: Self-pay

## 2019-02-09 NOTE — Telephone Encounter (Signed)
DOS 02/21/2019  GASTROCNEMIUS RECESS [phone])  [employer]  ELIGIBILITY DATE- 02/04/2018  DED $550 - $517.67 REMAINING  COPAY $75  COINS 80%  OOP - $2500.00 - $2500.00 REMAINING  NO PRECERT REQUIRED  CALL REFERENCE # [id number]

## 2019-02-13 ENCOUNTER — Telehealth: Payer: Self-pay | Admitting: *Deleted

## 2019-02-13 NOTE — Telephone Encounter (Signed)
"  I am calling because I have a surgery scheduled for February 17.  I'm calling because I want to know what my out of pocket expenses will be.  I just want to make sure I have enough for it.  I didn't get a call about the surgery time or anything but I want to talk to you about the out of pocket expenses.  Give me a call."

## 2019-02-14 NOTE — Telephone Encounter (Signed)
Left a message in reference to OOP and DED. Also told patient that the surgical center will call her the day before surgery to let her know what time to be there.

## 2019-02-16 DIAGNOSIS — M216X9 Other acquired deformities of unspecified foot: Secondary | ICD-10-CM

## 2019-02-19 ENCOUNTER — Ambulatory Visit (INDEPENDENT_AMBULATORY_CARE_PROVIDER_SITE_OTHER): Payer: Managed Care, Other (non HMO) | Admitting: Family Medicine

## 2019-02-19 ENCOUNTER — Other Ambulatory Visit: Payer: Self-pay

## 2019-02-19 ENCOUNTER — Encounter (INDEPENDENT_AMBULATORY_CARE_PROVIDER_SITE_OTHER): Payer: Self-pay | Admitting: Family Medicine

## 2019-02-19 VITALS — BP 110/73 | HR 71 | Temp 98.1°F | Ht 68.0 in | Wt 330.0 lb

## 2019-02-19 DIAGNOSIS — E559 Vitamin D deficiency, unspecified: Secondary | ICD-10-CM | POA: Diagnosis not present

## 2019-02-19 DIAGNOSIS — D352 Benign neoplasm of pituitary gland: Secondary | ICD-10-CM | POA: Diagnosis not present

## 2019-02-19 DIAGNOSIS — R5383 Other fatigue: Secondary | ICD-10-CM | POA: Diagnosis not present

## 2019-02-19 DIAGNOSIS — R0602 Shortness of breath: Secondary | ICD-10-CM | POA: Diagnosis not present

## 2019-02-19 DIAGNOSIS — Z9189 Other specified personal risk factors, not elsewhere classified: Secondary | ICD-10-CM | POA: Diagnosis not present

## 2019-02-19 DIAGNOSIS — Z1331 Encounter for screening for depression: Secondary | ICD-10-CM

## 2019-02-19 DIAGNOSIS — Z0289 Encounter for other administrative examinations: Secondary | ICD-10-CM

## 2019-02-19 DIAGNOSIS — Z6841 Body Mass Index (BMI) 40.0 and over, adult: Secondary | ICD-10-CM

## 2019-02-20 LAB — HEMOGLOBIN A1C

## 2019-02-20 NOTE — Progress Notes (Signed)
Dear Dr. Quincy Simmonds,   Thank you for referring Ana Padilla to our clinic. The following note includes my evaluation and treatment recommendations.  Chief Complaint:   OBESITY Ana Padilla (MR# [id number]) is a 34 y.o. female who presents for evaluation and treatment of obesity and related comorbidities. Ana Padilla was referred to our clinic by Ana Half, MD. Current BMI is Body mass index is 50.18 kg/m.Ana Padilla Ana Padilla has been struggling with Ana Padilla weight for many years and has been unsuccessful in either losing weight, maintaining weight loss, or reaching Ana Padilla healthy weight goal.  Ana Padilla is currently in the action stage of change and ready to dedicate time achieving and maintaining a healthier weight. Ana Padilla is interested in becoming our patient and working on intensive lifestyle modifications including (but not limited to) diet and exercise for weight loss. Rever anticipates trying for a baby in the upcoming months.  [name] habits were reviewed today and are as follows: Ana Padilla family eats meals together, she thinks Ana Padilla family will eat healthier with Ana Padilla, Ana Padilla desired weight loss is 130 pounds, she has been heavy most of Ana Padilla life, she started gaining weight at 34 years of age, Ana Padilla heaviest weight ever was 360 pounds, she has significant food cravings issues, she skips meals frequently, she is frequently drinking liquids with calories, she frequently makes poor food choices, she sometimes eats larger portions than normal and she struggles with emotional eating.  Ana Padilla is ordering in most nights. She often skips breakfast, but if she forces herself to eat, she has chicken salad with crackers, and [place] tea (+/- hunger, feels satisfied). Around 5:00 or 6:00 PM, Ana Padilla has a hot dog, ice cream and soda or a sandwich (ham & cheese), chips and soda. 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  Depression Screen Ana Padilla's Food and Mood (modified PHQ-9) score was mildly  positive.  Depression screen PHQ 2/9 02/19/2019  Decreased Interest 1  Down, Depressed, Hopeless 0  PHQ - 2 Score 1  Altered sleeping 1  Tired, decreased energy 2  Change in appetite 1  Feeling bad or failure about yourself  0  Trouble concentrating 1  Moving slowly or fidgety/restless 0  Suicidal thoughts 0  PHQ-9 Score 6  Difficult doing work/chores Somewhat difficult   Subjective:   Other fatigue (new)  Nefertiti admits to daytime somnolence and admits to waking up still tired. Patent has a history of symptoms of daytime fatigue and morning fatigue. Ana Padilla generally gets 5 to 9 hours of sleep per night, and states that she has generally restless sleep. Snoring is not present. Apneic episodes are not present. Epworth Sleepiness Score is 7. EKG ordered today shows normal sinus rhythm at 66 BPM.  Shortness of breath on exertion (new) Ana Padilla notes increasing shortness of breath with exercising and seems to be worsening over time with weight gain. She notes getting out of breath sooner with activity than she used to. This has not gotten worse recently. [name] denies shortness of breath at rest or orthopnea.  Vitamin D deficiency  Ana Padilla's Vitamin D level was 21.9 on 05/31/2017. She is not on vit D replacement. She admits fatigue and denies nausea, vomiting or muscle weakness.  Pituitary adenoma ([place]) Ana Padilla sees Ana Padilla. She is currently on Cabergoline.  At risk for gestational diabetes mellitus Ana Padilla is at increased risk of gestational diabetes due to history of obesity.  Assessment/Plan:   Other fatigue (new) Ana Padilla does feel that Ana Padilla weight is causing Ana Padilla energy to  be lower than it should be. Fatigue may be related to obesity, depression or many other causes. Labs and EKG will be ordered, and in the meanwhile, Ana Padilla will focus on self care including making healthy food choices, increasing physical activity and focusing on stress reduction.  Shortness of breath on exertion  (new)  Ana Padilla does feel that she gets out of breath more easily that she used to when she exercises. Ana Padilla's shortness of breath appears to be obesity related and exercise induced. She has agreed to work on weight loss and gradually increase exercise to treat Ana Padilla exercise induced shortness of breath. Labs and indirect calorimetry will be ordered today and we will continue to monitor closely.  Vitamin D deficiency  Low Vitamin D level contributes to fatigue and are associated with obesity, breast, and colon cancer. WE will check vitamin D level today.    Pituitary adenoma ([facility]) Ana Padilla will follow up with Ana Padilla.  At risk for gestational diabetes mellitus Ana Padilla was given approximately 15 minutes of gestational diabetes prevention counseling today. She is 34 y.o. female and has risk factors for gestational diabetes including obesity. We discussed intensive lifestyle modifications today with an emphasis on specific nutritional instructions and strategies.   Repetitive spaced learning was employed today to elicit superior memory formation and behavioral change.  Class 3 severe obesity due to excess calories without serious comorbidity with body mass index (BMI) of 50.0 to 59.9 in adult [facility]) Ana Padilla is currently in the action stage of change and Ana Padilla goal is to continue with weight loss efforts. I recommend Ana Padilla begin the structured treatment plan as follows:  She has agreed to the Category 3 Plan +100 calories.  Exercise goals: No exercise has been prescribed at this time.   Behavioral modification strategies: increasing lean protein intake, increasing vegetables, meal planning and cooking strategies, keeping healthy foods in the home and planning for success.  She was informed of the importance of frequent follow-up visits to maximize Ana Padilla success with intensive lifestyle modifications for Ana Padilla multiple health conditions. She was informed we would discuss Ana Padilla lab results at Ana Padilla next visit  unless there is a critical issue that needs to be addressed sooner. Ana Padilla agreed to keep Ana Padilla next visit at the agreed upon time to discuss these results.  Objective:   Blood pressure 110/73, pulse 71, temperature 98.1 F (36.7 C), temperature source Oral, height 5\' 8"  (1.727 m), weight (!) 330 lb (149.7 kg), last menstrual period 02/11/1919, SpO2 97 %. Body mass index is 50.18 kg/m.  EKG: Normal sinus rhythm, rate 66 BPM.  Indirect Calorimeter completed today shows a VO2 of 391 and a REE of 2721.  Ana Padilla calculated basal metabolic rate is 99991111 thus Ana Padilla basal metabolic rate is better than expected.  General: Cooperative, alert, well developed, in no acute distress. HEENT: Conjunctivae and lids unremarkable. Cardiovascular: Regular rhythm.  Lungs: Normal work of breathing. Neurologic: No focal deficits.   Lab Results  Component Value Date   CREATININE 0.78 08/04/2017   BUN 9 08/04/2017   NA 139 08/04/2017   K 3.8 08/04/2017   CL 107 08/04/2017   CO2 23 08/04/2017   Lab Results  Component Value Date   ALT 12 09/20/2016   AST 15 09/20/2016   ALKPHOS 65 09/20/2016   BILITOT 0.4 09/20/2016   Lab Results  Component Value Date   HGBA1C 5.4 11/28/2017   HGBA1C 5.6 09/20/2016   No results found for: INSULIN Lab Results  Component Value Date  TSH 1.19 09/20/2016   Lab Results  Component Value Date   CHOL 198 09/20/2016   HDL 79.40 09/20/2016   LDLCALC 92 09/20/2016   TRIG 134.0 09/20/2016   CHOLHDL 2 09/20/2016   Lab Results  Component Value Date   WBC 7.7 01/10/2019   HGB 12.3 01/10/2019   HCT 38.0 01/10/2019   MCV 74 (L) 01/10/2019   PLT 407 01/10/2019   Lab Results  Component Value Date   IRON 52 01/10/2019   TIBC 337 11/28/2017   FERRITIN 186 (H) 01/10/2019    Ref. Range 05/31/2017 08:06  Vitamin D, 25-Hydroxy Latest Ref Range: 30.0 - 100.0 ng/mL 21.9 (L)    Attestation Statements:   Reviewed by clinician on day of visit: allergies, medications, problem  list, medical history, surgical history, family history, social history, and previous encounter notes.  I, Doreene Nest, am acting as transcriptionist for Eber Jones, MD.  I have reviewed the above documentation for accuracy and completeness, and I agree with the above. - Ilene Qua, MD

## 2019-02-21 ENCOUNTER — Encounter: Payer: Self-pay | Admitting: Podiatry

## 2019-02-21 ENCOUNTER — Other Ambulatory Visit: Payer: Self-pay | Admitting: Podiatry

## 2019-02-21 DIAGNOSIS — M216X2 Other acquired deformities of left foot: Secondary | ICD-10-CM

## 2019-02-21 LAB — COMPREHENSIVE METABOLIC PANEL
ALT: 18 IU/L (ref 0–32)
AST: 20 IU/L (ref 0–40)
Albumin/Globulin Ratio: 1.3 (ref 1.2–2.2)
Albumin: 4.3 g/dL (ref 3.8–4.8)
Alkaline Phosphatase: 65 IU/L (ref 39–117)
BUN/Creatinine Ratio: 15 (ref 9–23)
BUN: 11 mg/dL (ref 6–20)
Bilirubin Total: 0.5 mg/dL (ref 0.0–1.2)
CO2: 21 mmol/L (ref 20–29)
Calcium: 9.6 mg/dL (ref 8.7–10.2)
Chloride: 101 mmol/L (ref 96–106)
Creatinine, Ser: 0.75 mg/dL (ref 0.57–1.00)
GFR calc Af Amer: 123 mL/min/{1.73_m2} (ref >59)
GFR calc non Af Amer: 107 mL/min/{1.73_m2} (ref >59)
Globulin, Total: 3.2 g/dL (ref 1.5–4.5)
Glucose: 87 mg/dL (ref 65–99)
Potassium: 4.4 mmol/L (ref 3.5–5.2)
Sodium: 139 mmol/L (ref 134–144)
Total Protein: 7.5 g/dL (ref 6.0–8.5)

## 2019-02-21 LAB — VITAMIN D 25 HYDROXY (VIT D DEFICIENCY, FRACTURES): Vit D, 25-Hydroxy: 31.2 ng/mL (ref 30.0–100.0)

## 2019-02-21 LAB — INSULIN, RANDOM: INSULIN: 13.7 u[IU]/mL (ref 2.6–24.9)

## 2019-02-21 LAB — LIPID PANEL WITH LDL/HDL RATIO
Cholesterol, Total: 187 mg/dL (ref 100–199)
HDL: 70 mg/dL (ref >39)
LDL Chol Calc (NIH): 102 mg/dL — ABNORMAL HIGH (ref 0–99)
LDL/HDL Ratio: 1.5 ratio (ref 0.0–3.2)
Triglycerides: 83 mg/dL (ref 0–149)
VLDL Cholesterol Cal: 15 mg/dL (ref 5–40)

## 2019-02-21 LAB — T4, FREE: Free T4: 1.08 ng/dL (ref 0.82–1.77)

## 2019-02-21 LAB — FOLATE: Folate: 13.9 ng/mL (ref >3.0)

## 2019-02-21 LAB — CBC WITH DIFFERENTIAL/PLATELET

## 2019-02-21 LAB — TSH: TSH: 0.906 u[IU]/mL (ref 0.450–4.500)

## 2019-02-21 LAB — VITAMIN B12: Vitamin B-12: 435 pg/mL (ref 232–1245)

## 2019-02-21 LAB — T3: T3, Total: 111 ng/dL (ref 71–180)

## 2019-02-21 MED ORDER — OXYCODONE-ACETAMINOPHEN 5-325 MG PO TABS: 1.0000 | ORAL_TABLET | ORAL | 0 refills | Status: DC | PRN

## 2019-02-21 MED ORDER — CEPHALEXIN 500 MG PO CAPS: 500.0000 mg | ORAL_CAPSULE | Freq: Two times a day (BID) | ORAL | 0 refills | Status: DC

## 2019-02-21 MED ORDER — ONDANSETRON HCL 4 MG PO TABS: 4.0000 mg | ORAL_TABLET | Freq: Three times a day (TID) | ORAL | 0 refills | Status: DC | PRN

## 2019-02-22 ENCOUNTER — Telehealth: Payer: Self-pay | Admitting: Podiatry

## 2019-02-22 NOTE — Telephone Encounter (Signed)
Called patient for post-op check. Pain controlled, denies post-op issues. Advised to call the office or on-call should she experience any pain or concerns.

## 2019-02-26 ENCOUNTER — Telehealth (INDEPENDENT_AMBULATORY_CARE_PROVIDER_SITE_OTHER): Payer: Managed Care, Other (non HMO) | Admitting: *Deleted

## 2019-02-26 NOTE — Telephone Encounter (Signed)
I spoke with pt and she states she is having some stinging in the surgery site and is only getting up with the boot on. I told pt I would inform Dr. March Rummage and to check her pharmacy later this evening.

## 2019-02-26 NOTE — Telephone Encounter (Signed)
Pt called states she is still having some pain in her surgery foot about the 02/21/2019, and is usiong the oxycodone and alternating with the higher strength ibuprofen.

## 2019-02-27 ENCOUNTER — Other Ambulatory Visit: Payer: Self-pay | Admitting: Podiatry

## 2019-02-27 MED ORDER — OXYCODONE-ACETAMINOPHEN 5-325 MG PO TABS: 1.0000 | ORAL_TABLET | ORAL | 0 refills | Status: DC | PRN

## 2019-02-27 NOTE — Telephone Encounter (Signed)
Can you inform patient I refilled?

## 2019-02-27 NOTE — Telephone Encounter (Signed)
Refilled

## 2019-03-02 ENCOUNTER — Ambulatory Visit (INDEPENDENT_AMBULATORY_CARE_PROVIDER_SITE_OTHER): Payer: Managed Care, Other (non HMO) | Admitting: Podiatry

## 2019-03-02 ENCOUNTER — Other Ambulatory Visit: Payer: Self-pay

## 2019-03-02 DIAGNOSIS — Z9889 Other specified postprocedural states: Secondary | ICD-10-CM

## 2019-03-02 MED ORDER — OXYCODONE-ACETAMINOPHEN 5-325 MG PO TABS: 1.0000 | ORAL_TABLET | ORAL | 0 refills | Status: DC | PRN

## 2019-03-02 NOTE — Progress Notes (Signed)
Subjective:  Patient ID: Ana Padilla, female    DOB: [birth year],  MRN: [id number]  Chief Complaint  Patient presents with  . Routine Post Op    POV#1 DOS 02.17.2021 Gastrocnemius Recess Lt, Manipulation of Ankle Joint under Anesthesia. Pt states her staples feel a bit tight. Denies fever/nausea/vomiting/chills.    DOS: 02/21/19 Procedure: Gastrocnemius Recession, MUA Ankle left  34 y.o. female presents with the above complaint. History confirmed with patient. Doing well denies post-op issues or concerns.  Objective:  Physical Exam: tenderness at the surgical site, local edema noted and calf supple, nontender. Incision: healing well, no significant drainage, no dehiscence, no significant erythema  Assessment:   1. Post-operative state    Plan:  Patient was evaluated and treated and all questions answered.  Post-operative State -Ok to start showering at this time. Advised they cannot soak. -Dressing applied consisting of mepilex border dressing -WBAT in CAM boot -Pain medication refilled  Return in about 1 week (around 03/09/2019) for Post-Op (No XRs).

## 2019-03-05 ENCOUNTER — Encounter (INDEPENDENT_AMBULATORY_CARE_PROVIDER_SITE_OTHER): Payer: Self-pay

## 2019-03-05 ENCOUNTER — Ambulatory Visit (INDEPENDENT_AMBULATORY_CARE_PROVIDER_SITE_OTHER): Payer: Managed Care, Other (non HMO) | Admitting: Family Medicine

## 2019-03-08 ENCOUNTER — Other Ambulatory Visit: Payer: Self-pay

## 2019-03-08 ENCOUNTER — Ambulatory Visit (INDEPENDENT_AMBULATORY_CARE_PROVIDER_SITE_OTHER): Payer: Managed Care, Other (non HMO) | Admitting: Podiatry

## 2019-03-08 VITALS — Temp 96.6°F

## 2019-03-08 DIAGNOSIS — Z9889 Other specified postprocedural states: Secondary | ICD-10-CM

## 2019-03-08 NOTE — Progress Notes (Signed)
Subjective:  Patient ID: Ana Padilla, female    DOB: [birth year],  MRN: [id number]  Chief Complaint  Patient presents with  . Post-op Follow-up    L side. Pt stated, "I've had more pain than I expected - 7/10. I take 1/2 tablet of oxycodone, wait 1 hour, then take another half. I also take up to 600mg  of ibuprofen per day".   DOS: 02/21/19 Procedure: Gastrocnemius Recession, MUA Ankle left   34 y.o. female presents with the above complaint. History confirmed with patient. Doing well denies post-op issues or concerns.  Objective:  Physical Exam: tenderness at the surgical site, local edema noted and calf supple, nontender. Decreased sensation over the sural nerve distribution.  Incision: healing well, no significant drainage, no dehiscence, no significant erythema  Assessment:   1. Post-operative state    Plan:  Patient was evaluated and treated and all questions answered.  Post-operative State -Staples removed, Steri's applied -Will monitor sural nerve symptoms. This does appear intact at the toes so this does not appear to be a full transection.   Return in about 1 week (around 03/15/2019) for Post-Op (No XRs).

## 2019-03-22 ENCOUNTER — Encounter: Payer: Managed Care, Other (non HMO) | Admitting: Podiatry

## 2019-03-29 ENCOUNTER — Other Ambulatory Visit: Payer: Self-pay

## 2019-03-29 ENCOUNTER — Ambulatory Visit (INDEPENDENT_AMBULATORY_CARE_PROVIDER_SITE_OTHER): Payer: 59 | Admitting: Podiatry

## 2019-03-29 DIAGNOSIS — M7662 Achilles tendinitis, left leg: Secondary | ICD-10-CM

## 2019-03-29 DIAGNOSIS — Z9889 Other specified postprocedural states: Secondary | ICD-10-CM

## 2019-03-29 NOTE — Progress Notes (Signed)
Subjective:  Patient ID: Ana Padilla, female    DOB: 1985-11-04,  MRN: [id number]  Chief Complaint  Patient presents with  . Routine Post Op    POV#3 DOS 02.17.2021 Gastrocnemius Recess Lt, Manipulation of Ankle Joint under anesthesia. Pt states continued numbness, tingling and some tightness following her surgery. Pt also states she fell 1 week ago but does not believe she injured herself and denies any lasting issues. No other concerns.   DOS: 02/21/19 Procedure: Gastrocnemius Recession, MUA Ankle left   34 y.o. female presents with the above complaint. History confirmed with patient. Still having the numbness in her foot. States even with this it is better than the pain she was experiencing.  Objective:  Physical Exam: no tenderness at the surgical site, no edema noted and calf supple, nontender. Decreased sensation over the sural nerve distribution.  Incision: healed  Assessment:   1. Post-operative state   2. Achilles tendinitis of left lower extremity    Plan:  Patient was evaluated and treated and all questions answered.  Post-operative State -Doing well other than some tightness and neuritis. 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 Patient does not seem bothered by the complication. -Refer to PT for stretching and strengthening and nerve recovery protocol. -Will continue to monitor sural nerve. -Transition to normal shoegear.   No follow-ups on file.

## 2019-04-08 NOTE — Progress Notes (Signed)
Subjective:  Patient ID: Ana Padilla, female    DOB: Jun 02, 1985,  MRN: [id number]  Chief Complaint  Patient presents with  . Foot Pain    pt is here for left foot pain, pt states that the left foot pain is feeling worse since the last time she was here.     34 y.o. female presents with the above complaint. History confirmed with patient. States that physical therapy helps but feels it when she goes back to work. Meloxicam isnt'y helping. Pain radiattes to the top of the left leg. Pain 9/10  Objective:  Physical Exam: warm, good capillary refill, no trophic changes or ulcerative lesions, normal DP and PT pulses and normal sensory exam. Left Foot: tenderness to palpation posterior calcaneus, no pain with calcaneal squeeze, decreased ankle joint ROM and +Silverskiold test      Assessment:   1. Achilles tendinitis of left lower extremity   2. Equinus deformity of foot   3. Tendonitis of ankle or foot   4. Calcaneal spur of left foot   5. Obesity due to excess calories without serious comorbidity, unspecified classification      Plan:  Patient was evaluated and treated and all questions answered.  Achilles Tendonitis -Discussed with patient continues conservative vs operative care. Patient opting for surgical intervention. Disucssed full Haglund's resection however this would be difficult given her weight and the need for NWB. Discussed performing gastrocnemius recession which she could WB after surgery. Discussed r/b/a. Specifically discussed possible sural nerve injury and possible need for future surgery should the pain not resolve with this alone. -CAM boot dispesned for post-op use. -Patient has failed all conservative therapy and wishes to proceed with surgical intervention. All risks, benefits, and alternatives discussed with patient. No guarantees given. Consent reviewed and signed by patient. -Planned procedures: Gastroc recession left   No follow-ups on file.

## 2019-04-12 ENCOUNTER — Other Ambulatory Visit: Payer: Self-pay

## 2019-04-12 ENCOUNTER — Telehealth: Payer: Self-pay | Admitting: *Deleted

## 2019-04-12 ENCOUNTER — Ambulatory Visit (INDEPENDENT_AMBULATORY_CARE_PROVIDER_SITE_OTHER): Payer: 59 | Admitting: Podiatry

## 2019-04-12 VITALS — Temp 98.1°F

## 2019-04-12 DIAGNOSIS — Z9889 Other specified postprocedural states: Secondary | ICD-10-CM

## 2019-04-12 DIAGNOSIS — M7732 Calcaneal spur, left foot: Secondary | ICD-10-CM

## 2019-04-12 DIAGNOSIS — M7662 Achilles tendinitis, left leg: Secondary | ICD-10-CM

## 2019-04-12 DIAGNOSIS — M7750 Other enthesopathy of unspecified foot: Secondary | ICD-10-CM

## 2019-04-12 NOTE — Progress Notes (Signed)
Subjective:  Patient ID: Ana Padilla, female    DOB: Dec 30, 1985,  MRN: [id number]  Chief Complaint  Patient presents with  . Post-op Follow-up    pov#4 dos 02.17.2021 Gastrocnemius Recess Lt, Manipulation of Ankle Joint under Anesthesia. Pt stated, "I'm doing well. Occasional pricking 4-5/10 pain. Some numbness and tingling. I walked w/o a boot when I traveled 1.5 wks ago. I think I overdid it".   DOS: 02/21/19 Procedure: Gastrocnemius Recession, MUA Ankle left   34 y.o. female presents with the above complaint. History confirmed with patient.    Objective:  Physical Exam: no tenderness at the surgical site, no edema noted and calf supple, nontender. Decreased sensation over the sural nerve distribution.  Incision: healed  Assessment:   1. Post-operative state   2. Achilles tendinitis of left lower extremity    Plan:  Patient was evaluated and treated and all questions answered.  Post-operative State -Still having sural neuritis, not bothered by this. -Her pain is improving but she is unable to do a lot of activity. I do not think she is ready to return to work. -Continue PT. -Return for f/u in 2 weeks   No follow-ups on file.

## 2019-04-12 NOTE — Telephone Encounter (Signed)
[place] request PT referral for pt, fax [phone]. I reviewed clinicals 03/29/2019 and Dr. March Rummage had wanted pt to have PT. Required referral form faxed to [facility].

## 2019-04-26 ENCOUNTER — Ambulatory Visit (INDEPENDENT_AMBULATORY_CARE_PROVIDER_SITE_OTHER): Payer: 59 | Admitting: Podiatry

## 2019-04-26 ENCOUNTER — Other Ambulatory Visit: Payer: Self-pay

## 2019-04-26 ENCOUNTER — Ambulatory Visit (INDEPENDENT_AMBULATORY_CARE_PROVIDER_SITE_OTHER): Payer: 59

## 2019-04-26 ENCOUNTER — Other Ambulatory Visit: Payer: Self-pay | Admitting: Podiatry

## 2019-04-26 ENCOUNTER — Encounter: Payer: Self-pay | Admitting: Podiatry

## 2019-04-26 DIAGNOSIS — M722 Plantar fascial fibromatosis: Secondary | ICD-10-CM

## 2019-04-26 DIAGNOSIS — M79672 Pain in left foot: Secondary | ICD-10-CM

## 2019-04-26 MED ORDER — METHYLPREDNISOLONE 4 MG PO TBPK: ORAL_TABLET | ORAL | 0 refills | Status: DC

## 2019-04-26 NOTE — Progress Notes (Signed)
Subjective:  Patient ID: Ana Padilla, female    DOB: Feb 06, 1985,  MRN: [id number]  Chief Complaint  Patient presents with  . Routine Post Op    POV#5 DOS 02.17.2021 GASTROCNEMIUS RECESS LT, MANIPULATION OF ANKLE JOINT UNDER ANESTHESIA. Pt states she has been having severe sharp plantar heel pain with bearing weight, 2 week duration. No known injuries.   DOS: 02/21/19 Procedure: Gastrocnemius Recession, MUA Ankle left   34 y.o. female presents with the above complaint. History confirmed with patient.    Objective:  Physical Exam: no tenderness at the surgical site, no edema noted and calf supple, nontender. Decreased sensation over the sural nerve distribution. Pain at the medial calcaneal tuber. Incision: healed  Assessment:   1. Plantar fasciitis, left    Plan:  Patient was evaluated and treated and all questions answered.  Post-operative State -Still having sural neuritis, thinks it is improving but not enough for full activity. -She is having worsening plantar fasciitis likely given new activity level. Offered injection, declined. Rx Medrol pack. -F/u in 3 weeks for recheck, plan to return to work shortly after if improved.   Return in about 3 weeks (around 05/17/2019).

## 2019-04-29 ENCOUNTER — Ambulatory Visit (HOSPITAL_COMMUNITY)
Admission: EM | Admit: 2019-04-29 | Discharge: 2019-04-29 | Disposition: A | Discharge status: Home / Self Care | Payer: 59 | Attending: Family Medicine | Admitting: Family Medicine

## 2019-04-29 ENCOUNTER — Encounter (HOSPITAL_COMMUNITY): Payer: Self-pay

## 2019-04-29 ENCOUNTER — Other Ambulatory Visit: Payer: Self-pay

## 2019-04-29 DIAGNOSIS — J988 Other specified respiratory disorders: Secondary | ICD-10-CM | POA: Diagnosis not present

## 2019-04-29 DIAGNOSIS — U071 COVID-19: Secondary | ICD-10-CM | POA: Insufficient documentation

## 2019-04-29 DIAGNOSIS — Z20822 Contact with and (suspected) exposure to covid-19: Secondary | ICD-10-CM | POA: Diagnosis present

## 2019-04-29 DIAGNOSIS — B9789 Other viral agents as the cause of diseases classified elsewhere: Secondary | ICD-10-CM

## 2019-04-29 LAB — SARS CORONAVIRUS 2 (TAT 6-24 HRS): SARS Coronavirus 2: POSITIVE — AB

## 2019-04-29 NOTE — ED Provider Notes (Signed)
[facility]    CSN: [id number] Arrival date & time: 04/29/19  [place]      History   Chief Complaint Chief Complaint  Patient presents with  . Fever    HPI Ana Padilla is a 34 y.o. female.   HPI  Martin Majestic out with friends a week ago 2 days ago developed body aches and fever, sore throat, feels weak No loss of taste or smell No cough or shortness of breath No nausea or vomiting although her appetite is diminished She is trying to drink more fluids.  Taking Tylenol for discomfort. No known exposure to illness Currently out of work because of an orthopedic issue  Past Medical History:  Diagnosis Date  . 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 Anemia   . Elevated ferritin level   . Heel spur, left   . IBS (irritable bowel syndrome)   . IBS (irritable bowel syndrome)   . Low iron   . Seasonal allergies   . Sickle cell trait ([place])   . Vitamin D deficiency     Patient Active Problem List   Diagnosis Date Noted  . Dysfunction of left eustachian tube 05/25/2018  . Cervical polyp 04/28/2017  . Morbid obesity ([place]) 04/28/2017  . Hyperprolactinemia ([place]) 02/11/2017  . Pituitary adenoma ([place]) 02/11/2017  . Sickle cell trait ([place]) 02/01/2017  . Iron deficiency anemia due to chronic blood loss 11/04/2016    Past Surgical History:  Procedure Laterality Date  . BLEPHAROPLASTY Right    lower lid x 3  . CHOLECYSTECTOMY  2016  . ELECTROLYSIS OF MISDIRECTED LASHES Right 08/04/2017   Procedure: ELECTROLYSIS OF MISDIRECTED LASHES RIGHT EYE;  Surgeon: Clista Bernhardt, MD;  Location: [facility];  Service: Ophthalmology;  Laterality: Right;  . ENTROPIAN REPAIR Right 08/04/2017   Procedure: REPAIR OF ENTROPION WITH TARSAL STRIP RIGHT EYE;  Surgeon: Clista Bernhardt, MD;  Location: [facility];  Service: Ophthalmology;  Laterality: Right;  . HYSTEROSCOPY WITH D & C N/A 06/27/2017   Procedure: DILATATION & CURETTAGE/HYSTEROSCOPY;  Surgeon: Nunzio Cobbs, MD;  Location: [facility];  Service: Gynecology;  Laterality: N/A;  . iud placement     been removed    OB History    Gravida  0   Para  0   Term  0   Preterm  0   AB  0   Living  0     SAB  0   TAB  0   Ectopic  0   Multiple  0   Live Births  0            Home Medications    Prior to Admission medications   Medication Sig Start Date End Date Taking? Authorizing Provider  acetaminophen (TYLENOL) 500 MG tablet Take 1 tablet (500 mg total) by mouth every 6 (six) hours as needed for mild pain or moderate pain. 06/27/17   Nunzio Cobbs, MD  cabergoline (DOSTINEX) 0.5 MG tablet Take 0.5 tablets (0.25 mg total) by mouth once a week. 05/05/18   Philemon Kingdom, MD  colestipol (COLESTID) 1 g tablet Take 1 tablet (1 g total) by mouth daily. Patient must keep upcoming office visit for further refills!!! 09/21/18   Alfredia Ferguson, PA-C    Family History Family History  Adopted: Yes  Problem Relation Age of Onset  . Sickle cell trait Mother   . Heart disease Father   . Obesity Father     Social  History Social History   Tobacco Use  . Smoking status: Never Smoker  . Smokeless tobacco: Never Used  Substance Use Topics  . Alcohol use: Yes    Alcohol/week: 2.0 standard drinks    Types: 2 Glasses of wine per week  . Drug use: No     Allergies   Food and Pollinex-t [modified tree tyrosine adsorbate]   Review of Systems Review of Systems  Constitutional: Positive for activity change, appetite change, chills, fatigue and fever.  HENT: Positive for sore throat. Negative for congestion.   Respiratory: Negative for cough and shortness of breath.   Gastrointestinal: Negative for nausea and vomiting.  Musculoskeletal: Positive for myalgias.  Neurological: Positive for weakness.     Physical Exam Triage Vital Signs ED Triage Vitals  Enc Vitals Group     BP 04/29/19 1616 121/64     Pulse Rate 04/29/19 1616 100     Resp 04/29/19 1616 20     Temp 04/29/19 1616 99.6  F (37.6 C)     Temp Source 04/29/19 1616 Oral     SpO2 04/29/19 1616 99 %     Weight --      Height --      Head Circumference --      Peak Flow --      Pain Score 04/29/19 1614 0     Pain Loc --      Pain Edu? --      Excl. in [place]? --    No data found.  Updated Vital Signs BP 121/64 (BP Location: Right Arm)   Pulse 100   Temp 99.6 F (37.6 C) (Oral)   Resp 20   LMP 04/08/2019   SpO2 99%      Physical Exam Constitutional:      General: She is not in acute distress.    Appearance: She is well-developed. She is obese. She is ill-appearing.     Comments: Pleasant.  Mildly ill  HENT:     Head: Normocephalic and atraumatic.     Mouth/Throat:     Mouth: Mucous membranes are moist.     Pharynx: Posterior oropharyngeal erythema present.     Comments: Mask is in place.  Posterior pharynx mildly injected.  No tonsillar swelling Eyes:     Conjunctiva/sclera: Conjunctivae normal.     Pupils: Pupils are equal, round, and reactive to light.  Cardiovascular:     Rate and Rhythm: Normal rate and regular rhythm.     Heart sounds: Normal heart sounds.  Pulmonary:     Effort: Pulmonary effort is normal. No respiratory distress.     Breath sounds: Normal breath sounds.     Comments: Lungs are clear Musculoskeletal:        General: Normal range of motion.     Cervical back: Normal range of motion.  Lymphadenopathy:     Cervical: No cervical adenopathy.  Skin:    General: Skin is warm and dry.  Neurological:     Mental Status: She is alert.  Psychiatric:        Mood and Affect: Mood normal.        Behavior: Behavior normal.      UC Treatments / Results  Labs (all labs ordered are listed, but only abnormal results are displayed) Labs Reviewed  SARS CORONAVIRUS 2 (TAT 6-24 HRS)    EKG   Radiology No results found.  Procedures Procedures (including critical care time)  Medications Ordered in UC Medications - No data to  display  Initial Impression / Assessment  and Plan / UC Course  I have reviewed the triage vital signs and the nursing notes.  Pertinent labs & imaging results that were available during my care of the patient were reviewed by me and considered in my medical decision making (see chart for details).     Reviewed the patient has a viral illness, possibly Covid.  Discussed quarantine.  Home treatment Final Clinical Impressions(s) / UC Diagnoses   Final diagnoses:  Viral respiratory illness  Suspected COVID-19 virus infection     Discharge Instructions     Go home to rest Drink plenty of fluids Take Tylenol for pain or fever You may take over-the-counter cough and cold medicines as needed You must quarantine at home until your test result is available You can check for your test result in MyChart    ED Prescriptions    None     PDMP not reviewed this encounter.   Raylene Everts, MD 04/29/19 1745

## 2019-04-29 NOTE — ED Triage Notes (Addendum)
Pt reports she feels she is having fever and discomfort in her throat. Pt states she is feeling weak, and she's being in bed x 2 days.Pt reports feeling her heart racing for the past 2 days.Pt is taking Tylenol.

## 2019-04-29 NOTE — Discharge Instructions (Signed)
Go home to rest Drink plenty of fluids Take Tylenol for pain or fever You may take over-the-counter cough and cold medicines as needed You must quarantine at home until your test result is available You can check for your test result in MyChart  

## 2019-04-30 ENCOUNTER — Telehealth: Payer: Self-pay | Admitting: Physician Assistant

## 2019-04-30 ENCOUNTER — Encounter: Payer: Self-pay | Admitting: Physician Assistant

## 2019-04-30 NOTE — Telephone Encounter (Signed)
Called to discuss with [name] about Covid symptoms and the use of bamlanivimab/etesevimab or casirivimab/imdevimab, a monoclonal antibody infusion for those with mild to moderate Covid symptoms and at a high risk of hospitalization.     Pt is qualified for this infusion at the [facility] infusion center due to co-morbid conditions and/or a member of an at-risk group (BMI >35), however would like to think more about infusion at this time. Symptoms tier reviewed as well as criteria for ending isolation.  Symptoms reviewed that would warrant ED/Hospital evaluation. Preventative practices reviewed. Patient verbalized understanding. Patient advised to call back if he decides that he does want to get infusion. Callback number to the infusion center given. Patient advised to go to Urgent care or ED with severe symptoms. Last date pt would be eligible for infusion is 05/07/19. She has our number to call us back.    Patient Active Problem List   Diagnosis Date Noted  . Dysfunction of left eustachian tube 05/25/2018  . Cervical polyp 04/28/2017  . Morbid obesity ([place]) 04/28/2017  . Hyperprolactinemia ([place]) 02/11/2017  . Pituitary adenoma ([place]) 02/11/2017  . Sickle cell trait ([place]) 02/01/2017  . Iron deficiency anemia due to chronic blood loss 11/04/2016    Angelena Form PA-C

## 2019-05-02 ENCOUNTER — Other Ambulatory Visit: Payer: Self-pay | Admitting: Physician Assistant

## 2019-05-02 DIAGNOSIS — U071 COVID-19: Secondary | ICD-10-CM

## 2019-05-02 MED ORDER — SODIUM CHLORIDE 0.9 % IV SOLN
Freq: Once | INTRAVENOUS | Status: AC
  Filled 2019-05-02: qty 20

## 2019-05-02 NOTE — Progress Notes (Signed)
  I connected by phone with Ana Padilla on 05/02/2019 at 10:15 AM to discuss the potential use of an new treatment for mild to moderate COVID-19 viral infection in non-hospitalized patients.  This patient is a 34 y.o. female that meets the FDA criteria for Emergency Use Authorization of bamlanivimab/etesevimab or casirivimab/imdevimab.  Has a (+) direct SARS-CoV-2 viral test result  Has mild or moderate COVID-19   Is ? 34 years of age and weighs ? 40 kg  Is NOT hospitalized due to COVID-19  Is NOT requiring oxygen therapy or requiring an increase in baseline oxygen flow rate due to COVID-19  Is within 10 days of symptom onset  Has at least one of the high risk factor(s) for progression to severe COVID-19 and/or hospitalization as defined in EUA.  Specific high risk criteria : BMI >/= 35   I have spoken and communicated the following to the patient or parent/caregiver:  1. FDA has authorized the emergency use of bamlanivimab/etesevimab and casirivimab\imdevimab for the treatment of mild to moderate COVID-19 in adults and pediatric patients with positive results of direct SARS-CoV-2 viral testing who are 37 years of age and older weighing at least 40 kg, and who are at high risk for progressing to severe COVID-19 and/or hospitalization.  2. The significant known and potential risks and benefits of bamlanivimab/etesevimab and casirivimab\imdevimab, and the extent to which such potential risks and benefits are unknown.  3. Information on available alternative treatments and the risks and benefits of those alternatives, including clinical trials.  4. Patients treated with bamlanivimab/etesevimab and casirivimab\imdevimab should continue to self-isolate and use infection control measures (e.g., wear mask, isolate, social distance, avoid sharing personal items, clean and disinfect "high touch" surfaces, and frequent handwashing) according to CDC guidelines.   5. The patient or  parent/caregiver has the option to accept or refuse bamlanivimab/etesevimab or casirivimab\imdevimab .  After reviewing this information with the patient, The patient agreed to proceed with receiving the  Bamlanivimab/etesevimab infusion and will be provided a copy of the Fact sheet prior to receiving the infusion..  Sx onset 4/23. Infusion set up for tomorrow 4/29 @ 11:30am.   Angelena Form PA-C 05/02/2019 10:15 AM

## 2019-05-03 ENCOUNTER — Ambulatory Visit (HOSPITAL_COMMUNITY)
Admission: RE | Admit: 2019-05-03 | Discharge: 2019-05-03 | Disposition: A | Discharge status: Home / Self Care | Payer: 59 | Source: Ambulatory Visit | Attending: Pulmonary Disease | Admitting: Pulmonary Disease

## 2019-05-03 DIAGNOSIS — U071 COVID-19: Secondary | ICD-10-CM | POA: Insufficient documentation

## 2019-05-03 MED ORDER — EPINEPHRINE 0.3 MG/0.3ML IJ SOAJ: 0.3000 mg | Freq: Once | INTRAMUSCULAR | Status: DC | PRN

## 2019-05-03 MED ORDER — METHYLPREDNISOLONE SODIUM SUCC 125 MG IJ SOLR: 125.0000 mg | Freq: Once | INTRAMUSCULAR | Status: DC | PRN

## 2019-05-03 MED ORDER — DIPHENHYDRAMINE HCL 50 MG/ML IJ SOLN: 50.0000 mg | Freq: Once | INTRAMUSCULAR | Status: DC | PRN

## 2019-05-03 MED ORDER — FAMOTIDINE IN NACL 20-0.9 MG/50ML-% IV SOLN: 20.0000 mg | Freq: Once | INTRAVENOUS | Status: DC | PRN

## 2019-05-03 MED ORDER — SODIUM CHLORIDE 0.9 % IV SOLN: INTRAVENOUS | Status: DC | PRN

## 2019-05-03 MED ORDER — ALBUTEROL SULFATE HFA 108 (90 BASE) MCG/ACT IN AERS: 2.0000 | INHALATION_SPRAY | Freq: Once | RESPIRATORY_TRACT | Status: DC | PRN

## 2019-05-03 NOTE — Discharge Instructions (Signed)

## 2019-05-03 NOTE — Progress Notes (Signed)
  Diagnosis: COVID-19  Physician:Dr Joya Gaskins  Procedure: [facility] Infusion Clinic Med: bamlanivimab\etesevimab infusion - Provided patient with bamlanimivab\etesevimab fact sheet for patients, parents and caregivers prior to infusion.  Complications: No immediate complications noted.  Discharge: Discharged home   Ana Padilla 05/03/2019

## 2019-05-17 ENCOUNTER — Encounter: Payer: Self-pay | Admitting: Podiatry

## 2019-05-17 ENCOUNTER — Ambulatory Visit (INDEPENDENT_AMBULATORY_CARE_PROVIDER_SITE_OTHER): Payer: 59 | Admitting: Podiatry

## 2019-05-17 ENCOUNTER — Other Ambulatory Visit: Payer: Self-pay

## 2019-05-17 DIAGNOSIS — M792 Neuralgia and neuritis, unspecified: Secondary | ICD-10-CM

## 2019-05-17 DIAGNOSIS — M722 Plantar fascial fibromatosis: Secondary | ICD-10-CM

## 2019-05-22 ENCOUNTER — Telehealth: Payer: Self-pay | Admitting: Podiatry

## 2019-05-22 NOTE — Telephone Encounter (Signed)
Dr. March Rummage I am waiting on office note for 5/13 appointment to send over Disability paperwork.

## 2019-05-24 ENCOUNTER — Telehealth: Payer: Self-pay | Admitting: Internal Medicine

## 2019-05-24 DIAGNOSIS — D352 Benign neoplasm of pituitary gland: Secondary | ICD-10-CM

## 2019-05-24 MED ORDER — CABERGOLINE 0.5 MG PO TABS: 0.2500 mg | ORAL_TABLET | ORAL | 2 refills | Status: DC

## 2019-05-24 NOTE — Telephone Encounter (Signed)
Medication Refill Request  Did you call your pharmacy and request this refill first? Yes  . If patient has not contacted pharmacy first, instruct them to do so for future refills.  . Remind them that contacting the pharmacy for their refill is the quickest method to get the refill.  . Refill policy also stated that it will take anywhere between 24-72 hours to receive the refill.    Name of medication? cabergoline  Is this a 90 day supply? yes  Name and location of pharmacy?    [facility] # [place], [place] Phone:  [phone]  Fax:  [phone]

## 2019-05-24 NOTE — Telephone Encounter (Signed)
RX sent

## 2019-06-06 ENCOUNTER — Telehealth: Payer: Self-pay | Admitting: Podiatry

## 2019-06-06 NOTE — Telephone Encounter (Signed)
Pt called and would like to know if she can get an extension on returning to work after June 10th  that will be after her appt with Dr. March Rummage on June 10th please

## 2019-06-11 ENCOUNTER — Encounter: Payer: Self-pay | Admitting: Podiatry

## 2019-06-11 ENCOUNTER — Telehealth: Payer: Self-pay | Admitting: Podiatry

## 2019-06-11 NOTE — Telephone Encounter (Signed)
Patients disability company is asking for notes for office visit on 5/13, I dont see where anything has been dictated.

## 2019-06-14 ENCOUNTER — Ambulatory Visit: Payer: 59 | Admitting: Podiatry

## 2019-06-14 NOTE — Progress Notes (Signed)
Subjective:  Patient ID: Ana Padilla, female    DOB: Nov 22, 1985,  MRN: [id number]  Chief Complaint  Patient presents with  . Routine Post Op    POV Manipulation of ankle joint under anesthesia. Pt states still numb on the posterior heel/ankle and lateral aspect of the foot. Denies any pain but does have stiffness and tingling. Pt states had to miss some physical therapy because she caught Covid.   DOS: 02/21/19 Procedure: Gastrocnemius Recession, MUA Ankle left   34 y.o. female presents with the above complaint. History confirmed with patient.    Objective:  Physical Exam: no tenderness at the surgical site, no edema noted and calf supple, nontender. Decreased sensation over the sural nerve distribution.  Incision: healed  Assessment:   1. Plantar fasciitis, left   2. Neuralgia and neuritis    Plan:  Patient was evaluated and treated and all questions answered.  Post-operative State -Still having sural neuritis, again improving but not enough for return to activity. -Continue PT to [facility] -Continue out of work restrictions -F/u in 4 weeks .   No follow-ups on file.

## 2019-07-12 ENCOUNTER — Other Ambulatory Visit: Payer: Self-pay

## 2019-07-12 ENCOUNTER — Ambulatory Visit (INDEPENDENT_AMBULATORY_CARE_PROVIDER_SITE_OTHER): Payer: 59 | Admitting: Podiatry

## 2019-07-12 DIAGNOSIS — M7662 Achilles tendinitis, left leg: Secondary | ICD-10-CM

## 2019-07-12 DIAGNOSIS — M792 Neuralgia and neuritis, unspecified: Secondary | ICD-10-CM

## 2019-07-12 DIAGNOSIS — M722 Plantar fascial fibromatosis: Secondary | ICD-10-CM | POA: Diagnosis not present

## 2019-07-12 NOTE — Progress Notes (Signed)
Subjective:  Patient ID: Ana Padilla, female    DOB: December 04, 1985,  MRN: [id number]  Chief Complaint  Patient presents with  . Ankle Pain    pt is here for left ankle pain f/u pt states that she is feeling a lot better but states that her left ankle pain is due to her overusing it. Pt also states that she has been out of work and going to physical therapy   DOS: 02/21/19 Procedure: Gastrocnemius Recession, MUA Ankle left   34 y.o. female presents with the above complaint. History confirmed with patient. Sensation continues to improve. Pleased with surgery and not having any pain.  Objective:  Physical Exam: no tenderness at the surgical site, no edema noted and calf supple, nontender. Decreased sensation over the sural nerve distribution, improves since prior. Incision: healed  Assessment:   1. Neuralgia and neuritis   2. Plantar fasciitis, left   3. Achilles tendinitis of left lower extremity    Plan:  Patient was evaluated and treated and all questions answered.  Achilles Tendonitis -Overall doing much better. -Neuritis almost fully resolved, sensation returning. Almost fully returned to the foot, lagging in the leg -Ok to return to work next week, try for shorter work week to start -F/u in 6 weks.   Return in about 6 weeks (around 08/23/2019).

## 2019-08-03 ENCOUNTER — Encounter: Payer: Self-pay | Admitting: Podiatry

## 2019-08-09 ENCOUNTER — Encounter: Payer: Self-pay | Admitting: Family Medicine

## 2019-08-09 ENCOUNTER — Ambulatory Visit: Payer: 59 | Admitting: Family Medicine

## 2019-08-09 ENCOUNTER — Other Ambulatory Visit: Payer: Self-pay

## 2019-08-09 VITALS — BP 110/76 | HR 72 | Temp 98.5°F | Wt 354.0 lb

## 2019-08-09 DIAGNOSIS — R5383 Other fatigue: Secondary | ICD-10-CM | POA: Diagnosis not present

## 2019-08-09 DIAGNOSIS — F4321 Adjustment disorder with depressed mood: Secondary | ICD-10-CM

## 2019-08-09 NOTE — Progress Notes (Signed)
Subjective:    Patient ID: Ana Padilla, female    DOB: 05/15/1985, 34 y.o.   MRN: [id number]  No chief complaint on file.   HPI Patient was seen today for follow-up.  Pt endorses feeling fatigued/drained over the last few weeks.  Concern for anemia.  Endorses heavy menses.  No longer on OCPs is trying to become pregnant.  Also trying to lose weight as wants to be healthier for possible pregnancy.  Pt followed by Endo for h/o hyperprolactinemia on cabergoline. Pt endorses grief after the loss of her best friend in March and her grandmother yesterday.  Pt will go to [place] next wk, for her gm's funeral.  Past Medical History:  Diagnosis Date   Adenoma of pituitary ([facility]) 08/2014   Anemia    Elevated ferritin level    Heel spur, left    IBS (irritable bowel syndrome)    Low iron    Morbid obesity ([facility])    Seasonal allergies    Sickle cell trait ([facility])    Vitamin D deficiency     Allergies  Allergen Reactions   Food Other (See Comments)    Seasonal Allergies & fresh fruit---itchy mouth&nose    Pollinex-T [Modified Tree Tyrosine Adsorbate]     Itchy nose and eyes, ears, runny nose,     ROS General: Denies fever, chills, night sweats, changes in appetite   +changes in wt., fatigue HEENT: Denies headaches, ear pain, changes in vision, rhinorrhea, sore throat CV: Denies CP, palpitations, SOB, orthopnea Pulm: Denies SOB, cough, wheezing GI: Denies abdominal pain, nausea, vomiting, diarrhea, constipation GU: Denies dysuria, hematuria, frequency, vaginal discharge Msk: Denies muscle cramps, joint pains Neuro: Denies weakness, numbness, tingling Skin: Denies rashes, bruising Psych: Denies depression, anxiety, hallucinations  +stress     Objective:    Blood pressure 110/76, pulse 72, temperature 98.5 F (36.9 C), temperature source Oral, weight (!) 354 lb (160.6 kg), SpO2 99 %.   Gen. Pleasant, well-nourished, in no distress, normal affect  HEENT: [place]/AT, face symmetric,  conjunctiva clear, no scleral icterus, PERRLA, EOMI, nares patent without drainage Lungs: no accessory muscle use Cardiovascular: RRR, no peripheral edema Musculoskeletal: No deformities, no cyanosis or clubbing, normal tone Neuro:  A&Ox3, CN II-XII intact, normal gait Skin:  Warm, no lesions/ rash   Wt Readings from Last 3 Encounters:  08/09/19 (!) 354 lb (160.6 kg)  02/19/19 (!) 330 lb (149.7 kg)  01/10/19 (!) 339 lb 3.2 oz (153.9 kg)    Lab Results  Component Value Date   WBC CANCELED 02/19/2019   HGB 12.3 01/10/2019   HCT 38.0 01/10/2019   PLT 407 01/10/2019   GLUCOSE 87 02/19/2019   CHOL 187 02/19/2019   TRIG 83 02/19/2019   HDL 70 02/19/2019   LDLCALC 102 (H) 02/19/2019   ALT 18 02/19/2019   AST 20 02/19/2019   NA 139 02/19/2019   K 4.4 02/19/2019   CL 101 02/19/2019   CREATININE 0.75 02/19/2019   BUN 11 02/19/2019   CO2 21 02/19/2019   TSH 0.906 02/19/2019   INR 0.92 08/04/2017   HGBA1C WILL FOLLOW 02/19/2019    Assessment/Plan:  Fatigue, unspecified type  -discussed possible causes including anemia-req'd IV iron in past, vitamin def., depression, poor sleep -will check labs - Plan: Vitamin D, 25-hydroxy, CBC (no diff), CMP, Iron and TIBC, Folate, Lipid panel, TSH, T4, Free, Prolactin, Hemoglobin A1c, Hemoglobin A1c, Prolactin, T4, Free, TSH, Lipid panel, Folate, Iron and TIBC, CMP, CBC (no diff), Vitamin D, 25-hydroxy  Grief -discussed ways to deal with stress and loss -pt to practice self care.  F/u prn in 1 month  Abbe Amsterdam, MD

## 2019-08-09 NOTE — Patient Instructions (Signed)
Fatigue If you have fatigue, you feel tired all the time and have a lack of energy or a lack of motivation. Fatigue may make it difficult to start or complete tasks because of exhaustion. In general, occasional or mild fatigue is often a normal response to activity or life. However, long-lasting (chronic) or extreme fatigue may be a symptom of a medical condition. Follow these instructions at home: General instructions  Watch your fatigue for any changes.  Go to bed and get up at the same time every day.  Avoid fatigue by pacing yourself during the day and getting enough sleep at night.  Maintain a healthy weight. Medicines  Take over-the-counter and prescription medicines only as told by your health care provider.  Take a multivitamin, if told by your health care provider.  Do not use herbal or dietary supplements unless they are approved by your health care provider. Activity   Exercise regularly, as told by your health care provider.  Use or practice techniques to help you relax, such as yoga, tai chi, meditation, or massage therapy. Eating and drinking   Avoid heavy meals in the evening.  Eat a well-balanced diet, which includes lean proteins, whole grains, plenty of fruits and vegetables, and low-fat dairy products.  Avoid consuming too much caffeine.  Avoid the use of alcohol.  Drink enough fluid to keep your urine pale yellow. Lifestyle  Change situations that cause you stress. Try to keep your work and personal schedule in balance.  Do not use any products that contain nicotine or tobacco, such as cigarettes and e-cigarettes. If you need help quitting, ask your health care provider.  Do not use drugs. Contact a health care provider if:  Your fatigue does not get better.  You have a fever.  You suddenly lose or gain weight.  You have headaches.  You have trouble falling asleep or sleeping through the night.  You feel angry, guilty, anxious, or  sad.  You are unable to have a bowel movement (constipation).  Your skin is dry.  You have swelling in your legs or another part of your body. Get help right away if:  You feel confused.  Your vision is blurry.  You feel faint or you pass out.  You have a severe headache.  You have severe pain in your abdomen, your back, or the area between your waist and hips (pelvis).  You have chest pain, shortness of breath, or an irregular or fast heartbeat.  You are unable to urinate, or you urinate less than normal.  You have abnormal bleeding, such as bleeding from the rectum, vagina, nose, lungs, or nipples.  You vomit blood.  You have thoughts about hurting yourself or others. If you ever feel like you may hurt yourself or others, or have thoughts about taking your own life, get help right away. You can go to your nearest emergency department or call:  Your local emergency services (911 in the [place]  A suicide crisis helpline, such as the [facility] at 9164775271. This is open 24 hours a day. Summary  If you have fatigue, you feel tired all the time and have a lack of energy or a lack of motivation.  Fatigue may make it difficult to start or complete tasks because of exhaustion.  Long-lasting (chronic) or extreme fatigue may be a symptom of a medical condition.  Exercise regularly, as told by your health care provider.  Change situations that cause you stress. Try to keep your  work and personal schedule in balance. This information is not intended to replace advice given to you by your health care provider. Make sure you discuss any questions you have with your health care provider. Document Revised: 07/12/2018 Document Reviewed: 09/15/2016 Elsevier Patient Education  2020 [place] for [employer] Loss Calories are units of energy. Your body needs a certain amount of calories from food to keep you going throughout the day.  When you eat more calories than your body needs, your body stores the extra calories as fat. When you eat fewer calories than your body needs, your body burns fat to get the energy it needs. Calorie counting means keeping track of how many calories you eat and drink each day. Calorie counting can be helpful if you need to lose weight. If you make sure to eat fewer calories than your body needs, you should lose weight. Ask your health care provider what a healthy weight is for you. For calorie counting to work, you will need to eat the right number of calories in a day in order to lose a healthy amount of weight per week. A dietitian can help you determine how many calories you need in a day and will give you suggestions on how to reach your calorie goal.  A healthy amount of weight to lose per week is usually 1-2 lb (0.5-0.9 kg). This usually means that your daily calorie intake should be reduced by 500-750 calories.  Eating 1,200 - 1,500 calories per day can help most women lose weight.  Eating 1,500 - 1,800 calories per day can help most men lose weight. What is my plan? My goal is to have __________ calories per day. If I have this many calories per day, I should lose around __________ pounds per week. What do I need to know about calorie counting? In order to meet your daily calorie goal, you will need to:  Find out how many calories are in each food you would like to eat. Try to do this before you eat.  Decide how much of the food you plan to eat.  Write down what you ate and how many calories it had. Doing this is called keeping a food log. To successfully lose weight, it is important to balance calorie counting with a healthy lifestyle that includes regular activity. Aim for 150 minutes of moderate exercise (such as walking) or 75 minutes of vigorous exercise (such as running) each week. Where do I find calorie information?  The number of calories in a food can be found on a Nutrition  Facts label. If a food does not have a Nutrition Facts label, try to look up the calories online or ask your dietitian for help. Remember that calories are listed per serving. If you choose to have more than one serving of a food, you will have to multiply the calories per serving by the amount of servings you plan to eat. For example, the label on a package of bread might say that a serving size is 1 slice and that there are 90 calories in a serving. If you eat 1 slice, you will have eaten 90 calories. If you eat 2 slices, you will have eaten 180 calories. How do I keep a food log? Immediately after each meal, record the following information in your food log:  What you ate. Don't forget to include toppings, sauces, and other extras on the food.  How much you ate. This can be measured in  cups, ounces, or number of items.  How many calories each food and drink had.  The total number of calories in the meal. Keep your food log near you, such as in a small notebook in your pocket, or use a mobile app or website. Some programs will calculate calories for you and show you how many calories you have left for the day to meet your goal. What are some calorie counting tips?   Use your calories on foods and drinks that will fill you up and not leave you hungry: ? Some examples of foods that fill you up are nuts and nut butters, vegetables, lean proteins, and high-fiber foods like whole grains. High-fiber foods are foods with more than 5 g fiber per serving. ? Drinks such as sodas, specialty coffee drinks, alcohol, and juices have a lot of calories, yet do not fill you up.  Eat nutritious foods and avoid empty calories. Empty calories are calories you get from foods or beverages that do not have many vitamins or protein, such as candy, sweets, and soda. It is better to have a nutritious high-calorie food (such as an avocado) than a food with few nutrients (such as a bag of chips).  Know how many calories  are in the foods you eat most often. This will help you calculate calorie counts faster.  Pay attention to calories in drinks. Low-calorie drinks include water and unsweetened drinks.  Pay attention to nutrition labels for "low fat" or "fat free" foods. These foods sometimes have the same amount of calories or more calories than the full fat versions. They also often have added sugar, starch, or salt, to make up for flavor that was removed with the fat.  Find a way of tracking calories that works for you. Get creative. Try different apps or programs if writing down calories does not work for you. What are some portion control tips?  Know how many calories are in a serving. This will help you know how many servings of a certain food you can have.  Use a measuring cup to measure serving sizes. You could also try weighing out portions on a kitchen scale. With time, you will be able to estimate serving sizes for some foods.  Take some time to put servings of different foods on your favorite plates, bowls, and cups so you know what a serving looks like.  Try not to eat straight from a bag or box. Doing this can lead to overeating. Put the amount you would like to eat in a cup or on a plate to make sure you are eating the right portion.  Use smaller plates, glasses, and bowls to prevent overeating.  Try not to multitask (for example, watch TV or use your computer) while eating. If it is time to eat, sit down at a table and enjoy your food. This will help you to know when you are full. It will also help you to be aware of what you are eating and how much you are eating. What are tips for following this plan? Reading food labels  Check the calorie count compared to the serving size. The serving size may be smaller than what you are used to eating.  Check the source of the calories. Make sure the food you are eating is high in vitamins and protein and low in saturated and trans  fats. Shopping  Read nutrition labels while you shop. This will help you make healthy decisions before you decide to purchase  your food.  Make a grocery list and stick to it. Cooking  Try to cook your favorite foods in a healthier way. For example, try baking instead of frying.  Use low-fat dairy products. Meal planning  Use more fruits and vegetables. Half of your plate should be fruits and vegetables.  Include lean proteins like poultry and fish. How do I count calories when eating out?  Ask for smaller portion sizes.  Consider sharing an entree and sides instead of getting your own entree.  If you get your own entree, eat only half. Ask for a box at the beginning of your meal and put the rest of your entree in it so you are not tempted to eat it.  If calories are listed on the menu, choose the lower calorie options.  Choose dishes that include vegetables, fruits, whole grains, low-fat dairy products, and lean protein.  Choose items that are boiled, broiled, grilled, or steamed. Stay away from items that are buttered, battered, fried, or served with cream sauce. Items labeled "crispy" are usually fried, unless stated otherwise.  Choose water, low-fat milk, unsweetened iced tea, or other drinks without added sugar. If you want an alcoholic beverage, choose a lower calorie option such as a glass of wine or light beer.  Ask for dressings, sauces, and syrups on the side. These are usually high in calories, so you should limit the amount you eat.  If you want a salad, choose a garden salad and ask for grilled meats. Avoid extra toppings like bacon, cheese, or fried items. Ask for the dressing on the side, or ask for olive oil and vinegar or lemon to use as dressing.  Estimate how many servings of a food you are given. For example, a serving of cooked rice is  cup or about the size of half a baseball. Knowing serving sizes will help you be aware of how much food you are eating at  restaurants. The list below tells you how big or small some common portion sizes are based on everyday objects: ? 1 oz--4 stacked dice. ? 3 oz--1 deck of cards. ? 1 tsp--1 die. ? 1 Tbsp-- a ping-pong ball. ? 2 Tbsp--1 ping-pong ball. ?  cup-- baseball. ? 1 cup--1 baseball. Summary  Calorie counting means keeping track of how many calories you eat and drink each day. If you eat fewer calories than your body needs, you should lose weight.  A healthy amount of weight to lose per week is usually 1-2 lb (0.5-0.9 kg). This usually means reducing your daily calorie intake by 500-750 calories.  The number of calories in a food can be found on a Nutrition Facts label. If a food does not have a Nutrition Facts label, try to look up the calories online or ask your dietitian for help.  Use your calories on foods and drinks that will fill you up, and not on foods and drinks that will leave you hungry.  Use smaller plates, glasses, and bowls to prevent overeating. This information is not intended to replace advice given to you by your health care provider. Make sure you discuss any questions you have with your health care provider. Document Revised: 09/09/2017 Document Reviewed: 11/21/2015 Elsevier Patient Education  [place].  Preventing Unhealthy [employer], Adult Staying at a healthy weight is important to your overall health. When fat builds up in your body, you may become overweight or obese. Being overweight or obese increases your risk of developing certain health problems,  such as heart disease, diabetes, sleeping problems, joint problems, and some types of cancer. Unhealthy weight gain is often the result of making unhealthy food choices or not getting enough exercise. You can make changes to your lifestyle to prevent obesity and stay as healthy as possible. What nutrition changes can be made?   Eat only as much as your body needs. To do this: ? Pay attention to signs that you  are hungry or full. Stop eating as soon as you feel full. ? If you feel hungry, try drinking water first before eating. Drink enough water so your urine is clear or pale yellow. ? Eat smaller portions. Pay attention to portion sizes when eating out. ? Look at serving sizes on food labels. Most foods contain more than one serving per container. ? Eat the recommended number of calories for your gender and activity level. For most active people, a daily total of 2,000 calories is appropriate. If you are trying to lose weight or are not very active, you may need to eat fewer calories. Talk with your health care provider or a diet and nutrition specialist (dietitian) about how many calories you need each day.  Choose healthy foods, such as: ? Fruits and vegetables. At each meal, try to fill at least half of your plate with fruits and vegetables. ? Whole grains, such as whole-wheat bread, brown rice, and quinoa. ? Lean meats, such as chicken or fish. ? Other healthy proteins, such as beans, eggs, or tofu. ? Healthy fats, such as nuts, seeds, fatty fish, and olive oil. ? Low-fat or fat-free dairy products.  Check food labels, and avoid food and drinks that: ? Are high in calories. ? Have added sugar. ? Are high in sodium. ? Have saturated fats or trans fats.  Cook foods in healthier ways, such as by baking, broiling, or grilling.  Make a meal plan for the week, and shop with a grocery list to help you stay on track with your purchases. Try to avoid going to the grocery store when you are hungry.  When grocery shopping, try to shop around the outside of the store first, where the fresh foods are. Doing this helps you to avoid prepackaged foods, which can be high in sugar, salt (sodium), and fat. What lifestyle changes can be made?   Exercise for 30 or more minutes on 5 or more days each week. Exercising may include brisk walking, yard work, biking, running, swimming, and team sports like  basketball and soccer. Ask your health care provider which exercises are safe for you.  Do muscle-strengthening activities, such as lifting weights or using resistance bands, on 2 or more days a week.  Do not use any products that contain nicotine or tobacco, such as cigarettes and e-cigarettes. If you need help quitting, ask your health care provider.  Limit alcohol intake to no more than 1 drink a day for nonpregnant women and 2 drinks a day for men. One drink equals 12 oz of beer, 5 oz of wine, or 1 oz of hard liquor.  Try to get 7-9 hours of sleep each night. What other changes can be made?  Keep a food and activity journal to keep track of: ? What you ate and how many calories you had. Remember to count the calories in sauces, dressings, and side dishes. ? Whether you were active, and what exercises you did. ? Your calorie, weight, and activity goals.  Check your weight regularly. Track any changes. If you  notice you have gained weight, make changes to your diet or activity routine.  Avoid taking weight-loss medicines or supplements. Talk to your health care provider before starting any new medicine or supplement.  Talk to your health care provider before trying any new diet or exercise plan. Why are these changes important? Eating healthy, staying active, and having healthy habits can help you to prevent obesity. Those changes also:  Help you manage stress and emotions.  Help you connect with friends and family.  Improve your self-esteem.  Improve your sleep.  Prevent long-term health problems. What can happen if changes are not made? Being obese or overweight can cause you to develop joint or bone problems, which can make it hard for you to stay active or do activities you enjoy. Being obese or overweight also puts stress on your heart and lungs and can lead to health problems like diabetes, heart disease, and some cancers. Where to find more information Talk with your  health care provider or a dietitian about healthy eating and healthy lifestyle choices. You may also find information from:  [place] Department of Agriculture, My[employer]ate: FormerBoss.no  American Heart Association: www.heart.org  Centers for Disease Control and Prevention: http://www.wolf.info/ Summary  Staying at a healthy weight is important to your overall health. It helps you to prevent certain diseases and health problems, such as heart disease, diabetes, joint problems, sleep disorders, and some types of cancer.  Being obese or overweight can cause you to develop joint or bone problems, which can make it hard for you to stay active or do activities you enjoy.  You can prevent unhealthy weight gain by eating a healthy diet, exercising regularly, not smoking, limiting alcohol, and getting enough sleep.  Talk with your health care provider or a dietitian for guidance about healthy eating and healthy lifestyle choices. This information is not intended to replace advice given to you by your health care provider. Make sure you discuss any questions you have with your health care provider. Document Revised: 12/24/2016 Document Reviewed: 01/28/2016 Elsevier Patient Education  [place] Loss, Adult People experience loss in many different ways throughout their lives. Events such as moving, changing jobs, and losing friends can create a sense of loss. The loss may be as serious as a major health change, divorce, death of a pet, or death of a loved one. All of these types of loss are likely to create a physical and emotional reaction known as grief. Grief is the result of a major change or an absence of something or someone that you count on. Grief is a normal reaction to loss. A variety of factors can affect your grieving experience, including:  The nature of your loss.  Your relationship to what or whom you lost.  Your understanding of grief and how to manage it.  Your support  system. How to manage lifestyle changes Keep to your normal routine as much as possible.  If you have trouble focusing or doing normal activities, it is acceptable to take some time away from your normal routine.  Spend time with friends and loved ones.  Eat a healthy diet, get plenty of sleep, and rest when you feel tired. How to recognize changes  The way that you deal with your grief will affect your ability to function as you normally do. When grieving, you may experience these changes:  Numbness, shock, sadness, anxiety, anger, denial, and guilt.  Thoughts about death.  Unexpected crying.  A physical sensation  of emptiness in your stomach.  Problems sleeping and eating.  Tiredness (fatigue).  Loss of interest in normal activities.  Dreaming about or imagining seeing the person who died.  A need to remember what or whom you lost.  Difficulty thinking about anything other than your loss for a period of time.  Relief. If you have been expecting the loss for a while, you may feel a sense of relief when it happens. Follow these instructions at home:  Activity Express your feelings in healthy ways, such as:  Talking with others about your loss. It may be helpful to find others who have had a similar loss, such as a support group.  Writing down your feelings in a journal.  Doing physical activities to release stress and emotional energy.  Doing creative activities like painting, sculpting, or playing or listening to music.  Practicing resilience. This is the ability to recover and adjust after facing challenges. Reading some resources that encourage resilience may help you to learn ways to practice those behaviors. General instructions  Be patient with yourself and others. Allow the grieving process to happen, and remember that grieving takes time. ? It is likely that you may never feel completely done with some grief. You may find a way to move on while still  cherishing memories and feelings about your loss. ? Accepting your loss is a process. It can take months or longer to adjust.  Keep all follow-up visits as told by your health care provider. This is important. Where to find support To get support for managing loss:  Ask your health care provider for help and recommendations, such as grief counseling or therapy.  Think about joining a support group for people who are managing a loss. Where to find more information You can find more information about managing loss from:  American Society of Clinical Oncology: www.cancer.net  American Psychological Association: TVStereos.ch Contact a health care provider if:  Your grief is extreme and keeps getting worse.  You have ongoing grief that does not improve.  Your body shows symptoms of grief, such as illness.  You feel depressed, anxious, or lonely. Get help right away if:  You have thoughts about hurting yourself or others. If you ever feel like you may hurt yourself or others, or have thoughts about taking your own life, get help right away. You can go to your nearest emergency department or call:  Your local emergency services (911 in the [place]  A suicide crisis helpline, such as the [facility] at (828)714-0100. This is open 24 hours a day. Summary  Grief is the result of a major change or an absence of someone or something that you count on. Grief is a normal reaction to loss.  The depth of grief and the period of recovery depend on the type of loss and your ability to adjust to the change and process your feelings.  Processing grief requires patience and a willingness to accept your feelings and talk about your loss with people who are supportive.  It is important to find resources that work for you and to realize that people experience grief differently. There is not one grieving process that works for everyone in the same way.  Be aware that when  grief becomes extreme, it can lead to more severe issues like isolation, depression, anxiety, or suicidal thoughts. Talk with your health care provider if you have any of these issues. This information is not intended to replace advice given  to you by your health care provider. Make sure you discuss any questions you have with your health care provider. Document Revised: 02/24/2018 Document Reviewed: 05/06/2016 Elsevier Patient Education  [place].

## 2019-08-10 ENCOUNTER — Other Ambulatory Visit: Payer: Self-pay | Admitting: Family Medicine

## 2019-08-10 ENCOUNTER — Encounter: Payer: Self-pay | Admitting: Family Medicine

## 2019-08-10 DIAGNOSIS — F4321 Adjustment disorder with depressed mood: Secondary | ICD-10-CM

## 2019-08-10 DIAGNOSIS — E559 Vitamin D deficiency, unspecified: Secondary | ICD-10-CM

## 2019-08-10 MED ORDER — VITAMIN D (ERGOCALCIFEROL) 1.25 MG (50000 UNIT) PO CAPS: 50000.0000 [IU] | ORAL_CAPSULE | ORAL | 0 refills | Status: DC

## 2019-08-10 MED ORDER — HYDROXYZINE HCL 25 MG PO TABS: 25.0000 mg | ORAL_TABLET | Freq: Three times a day (TID) | ORAL | 0 refills | Status: DC | PRN

## 2019-08-13 NOTE — Telephone Encounter (Signed)
Pt is calling in to see if she can have a short term anxiety medication due to having a lot of death and other things going on.  Pt would like to have a call back.

## 2019-08-14 LAB — CBC
HCT: 36.1 % (ref 35.0–45.0)
Hemoglobin: 10.8 g/dL — ABNORMAL LOW (ref 11.7–15.5)
MCH: 22 pg — ABNORMAL LOW (ref 27.0–33.0)
MCHC: 29.9 g/dL — ABNORMAL LOW (ref 32.0–36.0)
MCV: 73.4 fL — ABNORMAL LOW (ref 80.0–100.0)
MPV: 9.2 fL (ref 7.5–12.5)
Platelets: 460 10*3/uL — ABNORMAL HIGH (ref 140–400)
RBC: 4.92 10*6/uL (ref 3.80–5.10)
RDW: 14.5 % (ref 11.0–15.0)
WBC: 6.9 10*3/uL (ref 3.8–10.8)

## 2019-08-14 LAB — COMPREHENSIVE METABOLIC PANEL
AG Ratio: 1.2 (calc) (ref 1.0–2.5)
ALT: 11 U/L (ref 6–29)
AST: 15 U/L (ref 10–30)
Albumin: 3.9 g/dL (ref 3.6–5.1)
Alkaline phosphatase (APISO): 69 U/L (ref 31–125)
BUN: 12 mg/dL (ref 7–25)
CO2: 30 mmol/L (ref 20–32)
Calcium: 9.6 mg/dL (ref 8.6–10.2)
Chloride: 102 mmol/L (ref 98–110)
Creat: 0.75 mg/dL (ref 0.50–1.10)
Globulin: 3.2 g/dL (calc) (ref 1.9–3.7)
Glucose, Bld: 92 mg/dL (ref 65–99)
Potassium: 4.3 mmol/L (ref 3.5–5.3)
Sodium: 138 mmol/L (ref 135–146)
Total Bilirubin: 0.5 mg/dL (ref 0.2–1.2)
Total Protein: 7.1 g/dL (ref 6.1–8.1)

## 2019-08-14 LAB — VITAMIN B12: Vitamin B-12: 342 pg/mL (ref 200–1100)

## 2019-08-14 LAB — LIPID PANEL
Cholesterol: 176 mg/dL (ref <200)
HDL: 72 mg/dL (ref >=50)
LDL Cholesterol (Calc): 83 mg/dL (calc)
Non-HDL Cholesterol (Calc): 104 mg/dL (calc) (ref <130)
Total CHOL/HDL Ratio: 2.4 (calc) (ref <5.0)
Triglycerides: 115 mg/dL (ref <150)

## 2019-08-14 LAB — VITAMIN D 25 HYDROXY (VIT D DEFICIENCY, FRACTURES): Vit D, 25-Hydroxy: 17 ng/mL — ABNORMAL LOW (ref 30–100)

## 2019-08-14 LAB — FOLATE: Folate: 10 ng/mL

## 2019-08-14 LAB — HEMOGLOBIN A1C
Hgb A1c MFr Bld: 5.1 % of total Hgb (ref <5.7)
Mean Plasma Glucose: 100 (calc)
eAG (mmol/L): 5.5 (calc)

## 2019-08-14 LAB — T4, FREE: Free T4: 1.1 ng/dL (ref 0.8–1.8)

## 2019-08-14 LAB — IRON, TOTAL/TOTAL IRON BINDING CAP
%SAT: 15 % (calc) — ABNORMAL LOW (ref 16–45)
Iron: 54 ug/dL (ref 40–190)
TIBC: 366 mcg/dL (calc) (ref 250–450)

## 2019-08-14 LAB — TEST AUTHORIZATION 2

## 2019-08-14 LAB — PROLACTIN: Prolactin: 6.8 ng/mL

## 2019-08-14 LAB — TSH: TSH: 1.61 mIU/L

## 2019-08-15 ENCOUNTER — Encounter: Payer: Self-pay | Admitting: Family Medicine

## 2019-08-15 ENCOUNTER — Telehealth: Payer: Self-pay | Admitting: Hematology

## 2019-08-15 NOTE — Telephone Encounter (Signed)
Called pt per 8/10 sch msg - no answer - left message for patient to call back to set up an appt if needed

## 2019-08-28 ENCOUNTER — Ambulatory Visit: Payer: 59 | Admitting: Podiatry

## 2019-08-28 ENCOUNTER — Ambulatory Visit (INDEPENDENT_AMBULATORY_CARE_PROVIDER_SITE_OTHER): Payer: 59

## 2019-08-28 ENCOUNTER — Other Ambulatory Visit: Payer: Self-pay

## 2019-08-28 DIAGNOSIS — E6609 Other obesity due to excess calories: Secondary | ICD-10-CM | POA: Diagnosis not present

## 2019-08-28 DIAGNOSIS — M7750 Other enthesopathy of unspecified foot: Secondary | ICD-10-CM

## 2019-08-28 DIAGNOSIS — M7662 Achilles tendinitis, left leg: Secondary | ICD-10-CM | POA: Diagnosis not present

## 2019-08-28 DIAGNOSIS — M722 Plantar fascial fibromatosis: Secondary | ICD-10-CM | POA: Diagnosis not present

## 2019-09-17 ENCOUNTER — Other Ambulatory Visit: Payer: Self-pay

## 2019-09-17 ENCOUNTER — Telehealth (INDEPENDENT_AMBULATORY_CARE_PROVIDER_SITE_OTHER): Payer: 59 | Admitting: Internal Medicine

## 2019-09-17 ENCOUNTER — Encounter: Payer: Self-pay | Admitting: Internal Medicine

## 2019-09-17 ENCOUNTER — Telehealth: Payer: Self-pay

## 2019-09-17 DIAGNOSIS — N912 Amenorrhea, unspecified: Secondary | ICD-10-CM

## 2019-09-17 DIAGNOSIS — D352 Benign neoplasm of pituitary gland: Secondary | ICD-10-CM | POA: Diagnosis not present

## 2019-09-17 DIAGNOSIS — E221 Hyperprolactinemia: Secondary | ICD-10-CM

## 2019-09-17 NOTE — Telephone Encounter (Signed)
DOS 09/19/19  CALCANEAL OSTECTOMY LT - 45859  AETNA  EFFECTIVE DATE - 07/18/2019  PLAN DEDUCTIBLE - $550.00 W/ $0.00 REMAINING OUT OF POCKET - $2500.00 W/ $853.51 REMAINING COPAY $75.00 COINSURANCE - 80%  BENEFITS CALL REF # [id number]  SPOKE TO STEVE R AT [facility] REF# [id number] STEVE R, HE STATED NO AUTH IS REQUIRED FOR CPT [phone]

## 2019-09-17 NOTE — Progress Notes (Signed)
Patient ID: Ana Padilla, female   DOB: 1985/06/21, 34 y.o.   MRN: [id number]   Patient location: Home My location: Office Persons participating in the virtual visit: patient, provider  Referring Provider: Deeann Saint, MD  I connected with the patient on 09/17/19 at 10:26 AM EDT by a video enabled telemedicine application and verified that I am speaking with the correct person.   I discussed the limitations of evaluation and management by telemedicine and the availability of in person appointments. The patient expressed understanding and agreed to proceed.   Details of the encounter are shown below.   HPI  Ana Padilla is a 34 y.o.-year-old female, initially referred by her PCP, Dr. Salomon Fick, presenting for follow-up for pituitary adenoma and hyperprolactinemia.  Last visit 1 year and 4 months ago.  She is seen in the Weight management clinic at [facility].  Reviewed and addended history: She was diagnosed with a pituitary adenoma in 2014 during investigation for amenorrhea.  At that time, prolactin level was 43.  She ruled out for adrenal insufficiency and Cushing's syndrome then. She also ruled out for PCOS.  Reviewed records from previous endocrinologist: Received records from [facility] (Dr. 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  It decreased in size from 2015 to 2017:  MRI brain (07/29/2015): The previously reported hypoenhancing pituitary nodule, grossly, is slightly smaller, measuring 2.4 mm (previously 4 mm).  The pituitary gland is normal in size.  The infundibulum is midline.  The optic chiasm is normal.  After 1 month of Cabergoline use in 08/2015, her prolactin normalized and she restarted to have menstrual cycles.  Also, she has a history of adrenal insufficiency due to failed cosyntropin stimulation test in 2015, however, this was performed after 1 month use of dexamethasone and when retested by Dr. Garlon Hatchet, her ACTH and cortisol were within  normal limits in 2017.  After her diagnosis of adrenal insufficiency, she was on hydrocortisone for 6 months with significant weight gain. Dr. Garlon Hatchet strongly recommended against restarting corticosteroids.  She was started on Cabergoline in 2017 and at last visit she was taking 0.25 mg twice a week.  Her menses have been irregular but became regular after starting Cabergoline, however, she moved to [place] afterwards and was off Cabergoline for 7 months, during which she started to have amenorrhea again.  She restarted this in 10/2016.  In 02/2017, PRL level was excellent, at 4.5 and I advised her to decrease the dose of Cabergoline to 0.25 once a week only.  In In 07/2017, PRL was still normal at 8.3.  She was lost for follow-up afterwards and she stopped Cabergoline in the fall 2019... A prolactin level obtained in spring/2020 was very high.  This was obtained to a preconception counseling.  Reviewed prolactin levels: Lab Results  Component Value Date   PROLACTIN 6.8 08/09/2019   PROLACTIN 3.0 07/24/2018   PROLACTIN 62.3 (H) 05/02/2018   PROLACTIN 8.3 07/04/2017   PROLACTIN 4.5 02/11/2017   PROLACTIN 54.6 (H) 09/20/2016  03/04/2016: Prolactin 5.16, TSH 1.113 09/30/2015: Prolactin 21.3  07/29/2015: Prolactin 51.4  She mentioned that she started to have some breast fullness, amenorrhea, and headaches, but no galactorrhea after stopping Cabergoline.  She did not have a history of nausea, dizziness, headaches, from Cabergoline. She previously had headaches in the past but these resolved after new glasses and blepharoplasty.  We restarted Cabergoline 04/2018.  She is currently on 0.25 mg weekly.  She now has fatigue >> recent vit D  very low, at 17 >> started replacement.  She also has anemia for last set of labs.  We checked her pituitary hormones and these were normal: Component     Latest Ref Rng & Units 02/11/2017  IGF-I, LC/MS     63 - 373 ng/mL 93  Z-Score (Female)     -2.0 - 2 SD  -1.1  LH     mIU/mL 15.95  Cortisol - AM     mcg/dL 16.1  W960 ACTH     6 - 50 pg/mL 16   Lab Results  Component Value Date   TSH 1.61 08/09/2019   TSH 0.906 02/19/2019   TSH 1.19 09/20/2016   FREET4 1.1 08/09/2019   FREET4 1.08 02/19/2019   FREET4 0.68 09/20/2016    07/29/2015: Cortisol 12.9, ACTH 29, IGF-I 121 (63-373)  ROS: Constitutional: no weight gain/no weight loss, no fatigue, no subjective hyperthermia, no subjective hypothermia Eyes: no blurry vision, no xerophthalmia ENT: no sore throat, no nodules palpated in neck, no dysphagia, no odynophagia, no hoarseness Cardiovascular: no CP/no SOB/no palpitations/no leg swelling Respiratory: no cough/no SOB/no wheezing Gastrointestinal: no N/no V/no D/no C/no acid reflux Musculoskeletal: no muscle aches/no joint aches Skin: no rashes, no hair loss Neurological: no tremors/no numbness/no tingling/no dizziness  I reviewed pt's medications, allergies, PMH, social hx, family hx, and changes were documented in the history of present illness. Otherwise, unchanged from my initial visit note.  Past Medical History:  Diagnosis Date  . 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 Anemia   . Elevated ferritin level   . Heel spur, left   . IBS (irritable bowel syndrome)   . Low iron   . Morbid obesity (HCC)   . Seasonal allergies   . Sickle cell trait (HCC)   . Vitamin D deficiency    Past Surgical History:  Procedure Laterality Date  . BLEPHAROPLASTY Right    lower lid x 3  . CHOLECYSTECTOMY  2016  . ELECTROLYSIS OF MISDIRECTED LASHES Right 08/04/2017   Procedure: ELECTROLYSIS OF MISDIRECTED LASHES RIGHT EYE;  Surgeon: Floydene Flock, MD;  Location: [facility] OR;  Service: Ophthalmology;  Laterality: Right;  . ENTROPIAN REPAIR Right 08/04/2017   Procedure: REPAIR OF ENTROPION WITH TARSAL STRIP RIGHT EYE;  Surgeon: Floydene Flock, MD;  Location: [facility] OR;  Service: Ophthalmology;  Laterality: Right;  . HYSTEROSCOPY WITH D & C N/A  06/27/2017   Procedure: DILATATION & CURETTAGE/HYSTEROSCOPY;  Surgeon: Patton Salles, MD;  Location: [facility] ORS;  Service: Gynecology;  Laterality: N/A;  . iud placement     been removed   Social History   Socioeconomic History  . Marital status: Single    Spouse name: Not on file  . Number of children: 0  Social Needs  Occupational History  .  Scheduler  Tobacco Use  . Smoking status: Never Smoker  . Smokeless tobacco: Never Used  Substance and Sexual Activity  . Alcohol use: Yes    Comment: socially   . Drug use: No   Current Outpatient Medications on File Prior to Visit  Medication Sig Dispense Refill  . acetaminophen (TYLENOL) 500 MG tablet Take 1 tablet (500 mg total) by mouth every 6 (six) hours as needed for mild pain or moderate pain. 30 tablet 0  . cabergoline (DOSTINEX) 0.5 MG tablet Take 0.5 tablets (0.25 mg total) by mouth once a week. 10 tablet 2  . colestipol (COLESTID) 1 g tablet Take 1 tablet (1 g total) by mouth  daily. Patient must keep upcoming office visit for further refills!!! 90 tablet 4  . hydrOXYzine (ATARAX/VISTARIL) 25 MG tablet Take 1 tablet (25 mg total) by mouth 3 (three) times daily as needed for anxiety. 30 tablet 0  . Vitamin D, Ergocalciferol, (DRISDOL) 1.25 MG (50000 UNIT) CAPS capsule Take 1 capsule (50,000 Units total) by mouth every 7 (seven) days. 12 capsule 0   No current facility-administered medications on file prior to visit.   Allergies  Allergen Reactions  . Food Other (See Comments)    Seasonal Allergies & fresh fruit---itchy mouth&nose   . Pollinex-T [Modified Tree Tyrosine Adsorbate]     Itchy nose and eyes, ears, runny nose,    Family History  Adopted: Yes  Problem Relation Age of Onset  . Sickle cell trait Mother   . Heart disease Father   . Obesity Father   Also, hypertension in father  PE: There were no vitals taken for this visit. Wt Readings from Last 3 Encounters:  08/09/19 (!) 354 lb (160.6 kg)  02/19/19  (!) 330 lb (149.7 kg)  01/10/19 (!) 339 lb 3.2 oz (153.9 kg)   Constitutional:  in NAD  The physical exam was not performed (virtual visit).  ASSESSMENT: 1. Pituitary adenoma  2.  Hyperprolactinemia  3.  Amenorrhea  PLAN:  1. And 2.  Patient with a history of pituitary adenoma, incidentally found during investigation for amenorrhea.  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  Her prolactin was high at that time, suspicious of prolactinoma, but it appeared that the prolactin levels were too low for microadenoma.  We discussed about the possibility of stalk compression from a pituitary tumor which may not be a prolactinoma, but also the possibility of hook effect of a falsely low prolactin due to assay interference from a very high prolactin level in blood.  However, I received records after our last visit and it appeared that her pituitary tumor was actually a microadenoma.  In this case, her prolactin levels were as expected for a small tumor. -Of note, she was started on Cabergoline, initially 0.25 mg twice a week, which we decreased to 0.25 mg once a week, however, she came off Cabergoline in fall 2019.  The subsequent prolactin obtained during preconception counseling was high, at 62.3.  She had amenorrhea at that time.  We restarted Cabergoline once a week.  She is tolerating this well, without headaches, nausea, dizziness. -We reviewed together her latest prolactin level and this was excellent last month -For now, we will continue Cabergoline and we discussed about stopping Cabergoline as she gets pregnant and continuing off the medication for the duration of pregnancy.  Since she has a micro and not a macroadenoma, no follow-up is needed for the prolactin level or tumor size during pregnancy.  We discussed that we will recheck her prolactin level after pregnancy and after she finishes breast-feeding.  3.  Amenorrhea -Most likely  related to high prolactin in the setting of stopping Cabergoline treatment in the past -We restarted Cabergoline 04/2018 -Her menstrual cycles resumed and they are now regular but they are heavy and she also has anemia.  She was discussed with OB/GYN about these.  No orders of the defined types were placed in this encounter.    Carlus Pavlov, MD PhD [facility]

## 2019-09-17 NOTE — Progress Notes (Signed)
Subjective:  Patient ID: Ana Padilla, female    DOB: 08-03-1985,  MRN: [id number]  Chief Complaint  Patient presents with  . Follow-up    L achilles tendonitis and neuritis. Pt stated, "The pain is worse now. Constant stinging in the back of the heel - 8/10. PT stretches aren't helping anymore. The back of my leg is still numb. I'm alternating ibuprofen/naproxen/Tylenol".   DOS: 02/21/19 Procedure: Gastrocnemius Recession, MUA Ankle left   34 y.o. female presents with the above complaint. History confirmed with patient.    Objective:  Physical Exam: pain to palpation about the posterior heel left. Prominent haglund. Decreased sensation over the sural nerve distribution, improves since prior. Incision: healed  Assessment:   1. Achilles tendinitis of left lower extremity   2. Obesity due to excess calories without serious comorbidity, unspecified classification   3. Plantar fasciitis, left   4. Tendonitis of ankle or foot    Plan:  Patient was evaluated and treated and all questions answered.  Achilles Tendonitis, Haglund deformity -Still having significant pain -XR reviewed with patient. Large posterior spur slightly worsened since prior. -Discussed continued conservative vs surgical intervention. Patient would like to proceed with surgical intervention -Patient has failed all conservative therapy and wishes to proceed with surgical intervention. All risks, benefits, and alternatives discussed with patient. No guarantees given. Consent reviewed and signed by patient. -Planned procedures: Haglund resection with detachment/reattachment of Achilles tendon  -Risk factors: obesity  No follow-ups on file.

## 2019-09-17 NOTE — Patient Instructions (Addendum)
Please return to see me 1 month after you finish breastfeeding or in 1.5 years.  For now, continue the same Cabergoline dose.

## 2019-09-19 ENCOUNTER — Other Ambulatory Visit: Payer: Self-pay | Admitting: Podiatry

## 2019-09-19 ENCOUNTER — Encounter: Payer: Self-pay | Admitting: Podiatry

## 2019-09-19 DIAGNOSIS — M7662 Achilles tendinitis, left leg: Secondary | ICD-10-CM

## 2019-09-19 MED ORDER — OXYCODONE-ACETAMINOPHEN 10-325 MG PO TABS: 1.0000 | ORAL_TABLET | ORAL | 0 refills | Status: DC | PRN

## 2019-09-19 MED ORDER — CEPHALEXIN 500 MG PO CAPS
500.0000 mg | ORAL_CAPSULE | Freq: Two times a day (BID) | ORAL | 0 refills | Status: DC
Start: 2019-09-19 — End: 2019-12-20

## 2019-09-19 MED ORDER — ONDANSETRON HCL 4 MG PO TABS: 4.0000 mg | ORAL_TABLET | Freq: Three times a day (TID) | ORAL | 0 refills | Status: DC | PRN

## 2019-09-19 NOTE — Progress Notes (Signed)
Rx sent to pharmacy for outpatient surgery. °

## 2019-09-24 ENCOUNTER — Telehealth: Payer: Self-pay

## 2019-09-24 NOTE — Telephone Encounter (Signed)
Pt had surgery on 09/19/19 and needs a refill on her pain medication. Please send the refill to the [facility].

## 2019-09-25 ENCOUNTER — Other Ambulatory Visit: Payer: Self-pay

## 2019-09-25 ENCOUNTER — Ambulatory Visit (INDEPENDENT_AMBULATORY_CARE_PROVIDER_SITE_OTHER): Payer: 59

## 2019-09-25 ENCOUNTER — Ambulatory Visit (INDEPENDENT_AMBULATORY_CARE_PROVIDER_SITE_OTHER): Payer: 59 | Admitting: Podiatry

## 2019-09-25 ENCOUNTER — Telehealth: Payer: Self-pay

## 2019-09-25 DIAGNOSIS — M7732 Calcaneal spur, left foot: Secondary | ICD-10-CM | POA: Diagnosis not present

## 2019-09-25 DIAGNOSIS — Z9889 Other specified postprocedural states: Secondary | ICD-10-CM | POA: Diagnosis not present

## 2019-09-25 MED ORDER — IBUPROFEN 800 MG PO TABS: 800.0000 mg | ORAL_TABLET | Freq: Three times a day (TID) | ORAL | 0 refills | Status: DC | PRN

## 2019-09-25 MED ORDER — OXYCODONE-ACETAMINOPHEN 10-325 MG PO TABS: 1.0000 | ORAL_TABLET | ORAL | 0 refills | Status: DC | PRN

## 2019-09-25 NOTE — Telephone Encounter (Signed)
Refilled today, patient's medication was refilled.

## 2019-09-25 NOTE — Progress Notes (Signed)
Subjective:  Patient ID: Ana Padilla, female    DOB: [birth year],  MRN: [id number]  Chief Complaint  Patient presents with  . Routine Post Op    POV#1 DOS 9.15.2021 Calcaneal ostectomy left. Pt states healing well without any concerns aside some aching due to staples.    DOS: 09/19/19 Procedure: Resection haglund deformity with detachment/reattachment of Achilles tendon  34 y.o. female presents with the above complaint. History confirmed with patient.   Objective:  Physical Exam: tenderness at the surgical site, local edema noted and calf supple, nontender. Incision: healing well, no significant drainage, no dehiscence, no significant erythema  No images are attached to the encounter.  Radiographs: X-ray of the left foot: good resection of posterior heel spur, consistent with post-op state.  Assessment:   1. Calcaneal spur of left foot   2. Post-operative state     Plan:  Patient was evaluated and treated and all questions answered.  Post-operative State -XR reviewed with patient -NWB with knee scooter -Pain medication refilled, Ibuprofen filled so patient could alternate. -Short leg cast applied.  Procedure: Short Leg Cast Application  Rationale: above injury Technique: Well padded short leg cast applied with 3 layers of fiberglass casting material. Disposition: Patient tolerated procedure well. NV status intact post-application.   Return in about 2 weeks (around 10/09/2019) for Post-Op (No XRs).

## 2019-09-25 NOTE — Telephone Encounter (Signed)
Pt called requesting refill on pain medication ?

## 2019-10-02 ENCOUNTER — Encounter: Payer: 59 | Admitting: Podiatry

## 2019-10-12 ENCOUNTER — Other Ambulatory Visit: Payer: Self-pay

## 2019-10-12 ENCOUNTER — Ambulatory Visit (INDEPENDENT_AMBULATORY_CARE_PROVIDER_SITE_OTHER): Payer: 59 | Admitting: Podiatry

## 2019-10-12 DIAGNOSIS — M7732 Calcaneal spur, left foot: Secondary | ICD-10-CM

## 2019-10-12 DIAGNOSIS — Z9889 Other specified postprocedural states: Secondary | ICD-10-CM

## 2019-10-12 DIAGNOSIS — M7662 Achilles tendinitis, left leg: Secondary | ICD-10-CM

## 2019-10-12 MED ORDER — OXYCODONE-ACETAMINOPHEN 10-325 MG PO TABS: 1.0000 | ORAL_TABLET | ORAL | 0 refills | Status: DC | PRN

## 2019-10-12 NOTE — Progress Notes (Signed)
Subjective:  Patient ID: Ana Padilla, female    DOB: [birth year],  MRN: [id number]  Chief Complaint  Patient presents with  . Routine Post Op    denies numbness, tingling in lower ext., Denies N/V fever and chills   . Follow-up    requested refills on pain medication     DOS: 09/19/19 Procedure: Resection haglund deformity with detachment/reattachment of Achilles tendon  34 y.o. female presents with the above complaint. History confirmed with patient.   Objective:  Physical Exam: tenderness at the surgical site, local edema noted and calf supple, nontender. Incision: healing well, no significant drainage, no dehiscence, no significant erythema  Assessment:   1. Calcaneal spur of left foot   2. Post-operative state   3. Achilles tendinitis of left lower extremity     Plan:  Patient was evaluated and treated and all questions answered.  Post-operative State -XR reviewed with patient -NWB with knee scooter -Cast removed. Pt to be back in CAM boot for protection. -Sutures removed. Staples left intact. -Pain medication refilled,   -Refer to PT  PT Protocol -Work on PROM x1 week. No excessive ankle dorsiflexion THEN -Work on AROM x1 week, THEN -Work on Investment banker, operational in [employer] x1 week, THEN -Work on Investment banker, operational in [employer] x1 week    Return in about 2 weeks (around 10/26/2019) for Post-Op (No XRs).

## 2019-10-16 ENCOUNTER — Encounter: Payer: 59 | Admitting: Podiatry

## 2019-10-22 ENCOUNTER — Telehealth: Payer: Self-pay

## 2019-10-22 NOTE — Telephone Encounter (Signed)
Pt called stating that she is starting to have lots of pain and spasms. Pt would like pain medication. Pt states that her pain level is a 7-8 out of 10. Pt would like this medication send to the [place] on [address].

## 2019-10-23 MED ORDER — OXYCODONE-ACETAMINOPHEN 10-325 MG PO TABS: 1.0000 | ORAL_TABLET | ORAL | 0 refills | Status: DC | PRN

## 2019-10-23 NOTE — Addendum Note (Signed)
Addended by: Boneta Lucks on: 10/23/2019 04:26 AM   Modules accepted: Orders

## 2019-10-30 ENCOUNTER — Other Ambulatory Visit: Payer: Self-pay

## 2019-10-30 ENCOUNTER — Ambulatory Visit (INDEPENDENT_AMBULATORY_CARE_PROVIDER_SITE_OTHER): Payer: 59 | Admitting: Podiatry

## 2019-10-30 DIAGNOSIS — M7732 Calcaneal spur, left foot: Secondary | ICD-10-CM

## 2019-10-30 DIAGNOSIS — Z9889 Other specified postprocedural states: Secondary | ICD-10-CM

## 2019-11-04 NOTE — Progress Notes (Signed)
Subjective:  Patient ID: Ana Padilla, female    DOB: [birth year],  MRN: [id number]  Chief Complaint  Patient presents with   Routine Post Op    POV#3 Pt states pain in calcaneous. Denies fever/nausea/vomiting/chills.    DOS: 09/19/19 Procedure: Resection haglund deformity with detachment/reattachment of Achilles tendon  34 y.o. female presents with the above complaint. History confirmed with patient.   Objective:  Physical Exam: tenderness at the surgical site, local edema noted and calf supple, nontender. Incision: healing well, no significant drainage, no dehiscence, no significant erythema  Assessment:   1. Calcaneal spur of left foot   2. Post-operative state     Plan:  Patient was evaluated and treated and all questions answered.  Post-operative State -Staples removed today. Okay to shower and get foot wet. She do not need physical therapy as she did not hear from them about referral will place new referral  PT Protocol -New PT referral placed -Work on PROM x1 week. No excessive ankle dorsiflexion THEN -Work on AROM x1 week, THEN -Work on Investment banker, operational in [employer] x1 week, THEN -Work on Investment banker, operational in [employer] x1 week    Return in about 2 weeks (around 11/13/2019) for Post-Op (No XRs).

## 2019-11-05 ENCOUNTER — Telehealth: Payer: Self-pay | Admitting: Podiatry

## 2019-11-05 ENCOUNTER — Other Ambulatory Visit: Payer: Self-pay | Admitting: Podiatry

## 2019-11-05 DIAGNOSIS — M7732 Calcaneal spur, left foot: Secondary | ICD-10-CM

## 2019-11-05 DIAGNOSIS — Z9889 Other specified postprocedural states: Secondary | ICD-10-CM

## 2019-11-05 MED ORDER — OXYCODONE-ACETAMINOPHEN 10-325 MG PO TABS: 1.0000 | ORAL_TABLET | ORAL | 0 refills | Status: DC | PRN

## 2019-11-05 NOTE — Telephone Encounter (Signed)
Sent please inform 

## 2019-11-05 NOTE — Telephone Encounter (Signed)
Pt called stating she is in a lot of pain and would like pain meds. Please advise.

## 2019-11-05 NOTE — Progress Notes (Signed)
Rx sent to pharmacy   

## 2019-11-17 ENCOUNTER — Encounter: Payer: Self-pay | Admitting: Podiatry

## 2019-11-17 ENCOUNTER — Other Ambulatory Visit: Payer: Self-pay

## 2019-11-17 ENCOUNTER — Ambulatory Visit (INDEPENDENT_AMBULATORY_CARE_PROVIDER_SITE_OTHER): Payer: 59 | Admitting: Podiatry

## 2019-11-17 DIAGNOSIS — M7732 Calcaneal spur, left foot: Secondary | ICD-10-CM

## 2019-11-17 DIAGNOSIS — Z9889 Other specified postprocedural states: Secondary | ICD-10-CM

## 2019-11-17 MED ORDER — IBUPROFEN 800 MG PO TABS: 800.0000 mg | ORAL_TABLET | Freq: Three times a day (TID) | ORAL | 0 refills | Status: DC | PRN

## 2019-11-17 MED ORDER — OXYCODONE-ACETAMINOPHEN 5-325 MG PO TABS: 1.0000 | ORAL_TABLET | ORAL | 0 refills | Status: DC | PRN

## 2019-11-17 NOTE — Progress Notes (Signed)
Subjective:  Patient ID: Edmon Crape, female    DOB: August 05, 1985,  MRN: [id number]  Chief Complaint  Patient presents with  . Routine Post Op    doing ok on the left foot and i am going to physical therapy once a week     DOS: 09/19/19 Procedure: Resection haglund deformity with detachment/reattachment of Achilles tendon  34 y.o. female presents with the above complaint. History confirmed with patient. Only has pain with prolonged standing but doing well at PT.  Objective:  Physical Exam: tenderness at the surgical site, local edema noted and calf supple, nontender. Incision: well healed, some hyperkeratosis  Assessment:   1. Calcaneal spur of left foot   2. Post-operative state     Plan:  Patient was evaluated and treated and all questions answered.  Post-operative State -Doing quite better, able to walk well in boot, only pain with prolonged standing. -Transition to normal shoegear with PT once comfortable/able. -F/u in 1 month for recheck -Extend OOW at least 1 month   No follow-ups on file.

## 2019-11-19 ENCOUNTER — Telehealth: Payer: Self-pay | Admitting: Physician Assistant

## 2019-11-19 MED ORDER — COLESTIPOL HCL 1 G PO TABS
1.0000 g | ORAL_TABLET | Freq: Every day | ORAL | 0 refills | Status: DC
Start: 2019-11-19 — End: 2019-12-20

## 2019-11-19 NOTE — Telephone Encounter (Signed)
30 day supply has been sent to the pharmacy. Patient must keep appointment for further refills

## 2019-12-18 ENCOUNTER — Ambulatory Visit (INDEPENDENT_AMBULATORY_CARE_PROVIDER_SITE_OTHER): Payer: 59 | Admitting: Podiatry

## 2019-12-18 ENCOUNTER — Other Ambulatory Visit: Payer: Self-pay

## 2019-12-18 DIAGNOSIS — M7662 Achilles tendinitis, left leg: Secondary | ICD-10-CM

## 2019-12-18 DIAGNOSIS — Z9889 Other specified postprocedural states: Secondary | ICD-10-CM

## 2019-12-18 DIAGNOSIS — M7732 Calcaneal spur, left foot: Secondary | ICD-10-CM

## 2019-12-18 MED ORDER — DEXAMETHASONE 0.5 MG/5ML PO SOLN: ORAL | 0 refills | Status: DC

## 2019-12-18 NOTE — Progress Notes (Signed)
Subjective:  Patient ID: Ana Padilla, female    DOB: July 22, 1985,  MRN: [id number]  Chief Complaint  Patient presents with  . Routine Post Op    Pov#5 pt states experiencing pain and swelling at posterior tendon, very tender, has been icing.    DOS: 09/19/19 Procedure: Resection haglund deformity with detachment/reattachment of Achilles tendon  34 y.o. female presents with the above complaint. History confirmed with patient. Having pain at the back of the heel but doing therapy. Working on scar tissue with mobilization which does cause pain.  Objective:  Physical Exam: tenderness at the surgical site, local edema noted and calf supple, nontender. Prominent palpable scar tissue. Incision: well healed, some hyperkeratosis.  Assessment:   1. Calcaneal spur of left foot   2. Post-operative state   3. Achilles tendinitis of left lower extremity     Plan:  Patient was evaluated and treated and all questions answered.  Post-operative State -Good strength and motion noted. She does have some prominent scar tissue. Continue working with PT on this. RX Dexamethasone for use in PT with Iontophoresis.  Return in about 1 month (around 01/18/2020) for Tendonitis.

## 2019-12-20 ENCOUNTER — Ambulatory Visit: Payer: 59 | Admitting: Physician Assistant

## 2019-12-20 ENCOUNTER — Encounter: Payer: Self-pay | Admitting: Physician Assistant

## 2019-12-20 VITALS — BP 112/70 | HR 85 | Ht 67.0 in | Wt 357.0 lb

## 2019-12-20 DIAGNOSIS — K9089 Other intestinal malabsorption: Secondary | ICD-10-CM | POA: Diagnosis not present

## 2019-12-20 MED ORDER — COLESTIPOL HCL 1 G PO TABS
1.0000 g | ORAL_TABLET | Freq: Two times a day (BID) | ORAL | 12 refills | Status: DC
Start: 2019-12-20 — End: 2021-02-13

## 2019-12-20 NOTE — Progress Notes (Signed)
Noted  

## 2019-12-20 NOTE — Patient Instructions (Addendum)
If you are age 32 or older, your body mass index should be between 23-30. Your Body mass index is 55.91 kg/m. If this is out of the aforementioned range listed, please consider follow up with your Primary Care Provider.  If you are age 6 or younger, your body mass index should be between 19-25. Your Body mass index is 55.91 kg/m. If this is out of the aformentioned range listed, please consider follow up with your Primary Care Provider.   Refill of Colestipol has been sent in to your pharmacy. 1 tablet 1 hour away from other medications. Take 1 tablet daily, you may take a second tablet if needed.  Follow up in year or sooner if needed.  Thank you for entrusting me with your care and choosing [facility].  Amy Esterwood, PA-C

## 2019-12-20 NOTE — Progress Notes (Signed)
Subjective:    Patient ID: Ana Padilla, female    DOB: 1985/03/22, 34 y.o.   MRN: [id number]  HPI Ana Padilla is a pleasant 34 year old African-American female, known to myself and Dr. Marina Goodell.  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  She has been taking Colestid 1 g daily with good control of symptoms she says she will have random days of diarrhea that may occur once every couple of weeks and on those days may have several diarrheal episodes.  She is not having any other issues, denies any abdominal pain, bloating, cramping.  No upper GI symptoms. She mentions that she had gained about 20 pounds over the past several months, is recuperating from another surgery on her foot and is now in physical therapy and trying to get back on track with diet and exercise. Other medical issues include history of pituitary adenoma, hyperprolactinemia, obesity, sickle cell trait and prior history of iron deficiency anemia.  She plans to follow-up with her PCP in the near future as been having some increased episodes with menorrhagia.  Review of Systems Pertinent positive and negative review of systems were noted in the above HPI section.  All other review of systems was otherwise negative.  Outpatient Encounter Medications as of 12/20/2019  Medication Sig   acetaminophen (TYLENOL) 500 MG tablet Take 1 tablet (500 mg total) by mouth every 6 (six) hours as needed for mild pain or moderate pain.   cabergoline (DOSTINEX) 0.5 MG tablet Take 0.5 tablets (0.25 mg total) by mouth once a week.   dexamethasone (DECADRON) 0.5 MG/5ML solution For use with PT   ibuprofen (ADVIL) 800 MG tablet Take 1 tablet (800 mg total) by mouth every 8 (eight) hours as needed. Take with food.   Iron-Vitamins (S.S.S. TONIC PO) Take by mouth.   VITAMIN D, ERGOCALCIFEROL, PO Take 3,000 Units by mouth daily.   [DISCONTINUED] colestipol (COLESTID) 1 g tablet Take 1  tablet (1 g total) by mouth daily. Patient must keep upcoming office visit for further refills!!!   colestipol (COLESTID) 1 g tablet Take 1 tablet (1 g total) by mouth 2 (two) times daily.   [DISCONTINUED] cephALEXin (KEFLEX) 500 MG capsule Take 1 capsule (500 mg total) by mouth 2 (two) times daily. (Patient not taking: Reported on 12/20/2019)   [DISCONTINUED] hydrOXYzine (ATARAX/VISTARIL) 25 MG tablet Take 1 tablet (25 mg total) by mouth 3 (three) times daily as needed for anxiety. (Patient not taking: Reported on 12/20/2019)   [DISCONTINUED] ondansetron (ZOFRAN) 4 MG tablet Take 1 tablet (4 mg total) by mouth every 8 (eight) hours as needed for nausea or vomiting. (Patient not taking: Reported on 12/20/2019)   [DISCONTINUED] oxyCODONE-acetaminophen (PERCOCET) 5-325 MG tablet Take 1 tablet by mouth every 4 (four) hours as needed for severe pain. (Patient not taking: Reported on 12/20/2019)   [DISCONTINUED] Vitamin D, Ergocalciferol, (DRISDOL) 1.25 MG (50000 UNIT) CAPS capsule Take 1 capsule (50,000 Units total) by mouth every 7 (seven) days. (Patient not taking: Reported on 12/20/2019)   No facility-administered encounter medications on file as of 12/20/2019.   Allergies  Allergen Reactions   Food Other (See Comments)    Seasonal Allergies & fresh fruit---itchy mouth&nose    Pollinex-T [Modified Tree Tyrosine Adsorbate]     Itchy nose and eyes, ears, runny nose,    Patient Active Problem List   Diagnosis Date Noted   Bile salt-induced diarrhea 12/20/2019   Dysfunction of left eustachian tube  05/25/2018   Cervical polyp 04/28/2017   Morbid obesity (HCC) 04/28/2017   Hyperprolactinemia (HCC) 02/11/2017   Pituitary adenoma (HCC) 02/11/2017   Sickle cell trait (HCC) 02/01/2017   Iron deficiency anemia due to chronic blood loss 11/04/2016   Social History   Socioeconomic History   Marital status: Single    Spouse name: Not on file   Number of children: 0   Years of  education: Not on file   Highest education level: Not on file  Occupational History   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  Tobacco Use   Smoking status: Never Smoker   Smokeless tobacco: Never Used  Vaping Use   Vaping Use: Never used  Substance and Sexual Activity   Alcohol use: Yes    Alcohol/week: 2.0 standard drinks    Types: 2 Glasses of wine per week   Drug use: No   Sexual activity: Yes    Birth control/protection: Condom  Other Topics Concern   Not on file  Social History Narrative   Not on file   Social Determinants of Health   Financial Resource Strain: Not on file  Food Insecurity: Not on file  Transportation Needs: Not on file  Physical Activity: Not on file  Stress: Not on file  Social Connections: Not on file  Intimate Partner Violence: Not on file    Ms. Brooker's family history includes Heart disease in her father; Obesity in her father; Sickle cell trait in her mother. She was adopted.      Objective:    Vitals:   12/20/19 1150  BP: 112/70  Pulse: 85  SpO2: 98%    Physical Exam Well-developed well-nourished  AA female  in no acute distress.  Height, ZOXWRU,045  BMI 55.9  HEENT; nontraumatic normocephalic, EOMI, PE RR LA, sclera anicteric.  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  She will have occasional episodes of diarrhea occurring once or twice per month.  #2 morbid obesity 3.  Sickle cell trait 4.  History of pituitary adenoma and hyperprolactinemia 5.  Prior history of iron deficiency anemia  Plan; refill Colestid, advised taking 1 g p.o. daily, and may increase to twice daily as needed  For episodes of diarrhea.  #60/11 refills  Patient will follow  up with Dr. Marina Goodell or myself in 1 year or sooner as needed   Kaysan Peixoto Oswald Hillock PA-C 12/20/2019   Cc: Deeann Saint, MD

## 2020-01-18 ENCOUNTER — Other Ambulatory Visit: Payer: Self-pay

## 2020-01-18 ENCOUNTER — Ambulatory Visit (INDEPENDENT_AMBULATORY_CARE_PROVIDER_SITE_OTHER): Payer: 59 | Admitting: Podiatry

## 2020-01-18 DIAGNOSIS — M7662 Achilles tendinitis, left leg: Secondary | ICD-10-CM

## 2020-01-18 DIAGNOSIS — M7732 Calcaneal spur, left foot: Secondary | ICD-10-CM

## 2020-01-18 DIAGNOSIS — Z9889 Other specified postprocedural states: Secondary | ICD-10-CM | POA: Diagnosis not present

## 2020-01-18 NOTE — Progress Notes (Signed)
Subjective:  Patient ID: Ana Padilla, female    DOB: Jun 05, 1985,  MRN: [id number]  Chief Complaint  Patient presents with  . Routine Post Op    Pov#6 Pt states healing well and doing much better overall. Pt states increased range of motion. Pt admits to some tenderness in heel when standing for long periods of time and expressed some pain with physical therapy after about 30 minutes. Pt also states was unable to have Dexamethasone due to facility not having the proper equipment. No other concerns.   DOS: 09/19/19 Procedure: Resection haglund deformity with detachment/reattachment of Achilles tendon  35 y.o. female presents with the above complaint. History confirmed with patient.  Objective:  Physical Exam: tenderness at the surgical site, local edema noted and calf supple, nontender. Reduced palpable scar tissue. Incision: well healed, some hyperkeratosis.  Assessment:   1. Calcaneal spur of left foot   2. Post-operative state   3. Achilles tendinitis of left lower extremity     Plan:  Patient was evaluated and treated and all questions answered.  Post-operative State -Continues to improve, has much better motion today. Still has pain with prolonged standing especially on the bottom of the heel. -Continue PT. -Will continue out of work and then progress back. Return to work date 03/04/20. -Limitations at return to work:  No walking >30 mins without break  No standing > 1 hour without break  No lifting >10 lbs  No squatting  No follow-ups on file.

## 2020-02-01 ENCOUNTER — Ambulatory Visit (INDEPENDENT_AMBULATORY_CARE_PROVIDER_SITE_OTHER): Payer: 59 | Admitting: Podiatry

## 2020-02-01 ENCOUNTER — Other Ambulatory Visit: Payer: Self-pay

## 2020-02-01 DIAGNOSIS — M7662 Achilles tendinitis, left leg: Secondary | ICD-10-CM | POA: Diagnosis not present

## 2020-02-01 DIAGNOSIS — M7732 Calcaneal spur, left foot: Secondary | ICD-10-CM | POA: Diagnosis not present

## 2020-02-01 MED ORDER — MELOXICAM 15 MG PO TABS: 15.0000 mg | ORAL_TABLET | Freq: Every day | ORAL | 0 refills | Status: DC

## 2020-02-01 MED ORDER — METHYLPREDNISOLONE 4 MG PO TBPK: ORAL_TABLET | ORAL | 0 refills | Status: DC

## 2020-02-04 NOTE — Progress Notes (Signed)
Subjective:  Patient ID: Ana Padilla, female    DOB: 03/10/1985,  MRN: [id number]  Chief Complaint  Patient presents with  . Foot Pain    Pt states acute onset pain at plantar/posterior heel at location of surgical scar, pain worsened with dorsal flexion, 1 1/2 week duration.   DOS: 09/19/19 Procedure: Resection haglund deformity with detachment/reattachment of Achilles tendon  35 y.o. female presents with the above complaint. History confirmed with patient.  Objective:  Physical Exam: tenderness at the surgical site, local edema noted and calf supple, nontender. Reduced palpable scar tissue. Incision: well healed, some hyperkeratosis.  Assessment:   1. Calcaneal spur of left foot   2. Achilles tendinitis of left lower extremity     Plan:  Patient was evaluated and treated and all questions answered.  Post-operative State -Slightly worsened today.  Double therapy of meloxicam and Medrol pack.  Patient not to start the meloxicam till after completion of steroid pack.  We did discuss continued scar massage and stretching.  Should issues persist we may have to hold back in her return to work -Will continue out of work and then progress back. Return to work date 03/04/20. -Limitations at return to work:  No walking >30 mins without break  No standing > 1 hour without break  No lifting >10 lbs  No squatting  No follow-ups on file.

## 2020-02-22 ENCOUNTER — Ambulatory Visit (INDEPENDENT_AMBULATORY_CARE_PROVIDER_SITE_OTHER): Payer: 59 | Admitting: Podiatry

## 2020-02-22 ENCOUNTER — Other Ambulatory Visit: Payer: Self-pay

## 2020-02-22 DIAGNOSIS — M7732 Calcaneal spur, left foot: Secondary | ICD-10-CM

## 2020-02-22 DIAGNOSIS — M7662 Achilles tendinitis, left leg: Secondary | ICD-10-CM

## 2020-02-22 NOTE — Patient Instructions (Signed)
Recommend Arnica gel daily to scar to thin it out. Recommend Voltaren gel several times a day for inflammation.

## 2020-03-03 NOTE — Progress Notes (Signed)
Subjective:  Patient ID: Ana Padilla, female    DOB: 03/24/1985,  MRN: [id number]  Chief Complaint  Patient presents with  . Foot Pain    Pt states improvement but has frustration over continued soreness. Pt expressed interest in orthotics, inserts, braces/splints and anything else she can do to improve her foot pain.   DOS: 09/19/19 Procedure: Resection haglund deformity with detachment/reattachment of Achilles tendon  35 y.o. female presents with the above complaint. History confirmed with patient.  Objective:  Physical Exam: tenderness at the surgical site, local edema noted and calf supple, nontender. Reduced palpable scar tissue. Incision: well healed, some hyperkeratosis.  Assessment:   1. Calcaneal spur of left foot   2. Achilles tendinitis of left lower extremity     Plan:  Patient was evaluated and treated and all questions answered.  Post-operative State -At this point we will trial EPAT shockwave therapy to see if this can help alleviate her symptoms.  I think given time she should make significant improvement she does appear to be well-healed.  I recommend her continue physical therapy. No follow-ups on file.

## 2020-03-07 ENCOUNTER — Ambulatory Visit (INDEPENDENT_AMBULATORY_CARE_PROVIDER_SITE_OTHER): Payer: 59 | Admitting: Podiatry

## 2020-03-07 ENCOUNTER — Other Ambulatory Visit: Payer: Self-pay

## 2020-03-07 DIAGNOSIS — M7732 Calcaneal spur, left foot: Secondary | ICD-10-CM

## 2020-03-07 NOTE — Patient Instructions (Signed)

## 2020-03-07 NOTE — Progress Notes (Signed)
Patient presents for the 1st EPAT treatment today with complaint of left posterior heel pain. Diagnosed with Calcaneal spur of the left foot by Dr.Price. This has been ongoing for several months. The patient has tried ice, stretching, NSAIDS and supportive shoe gear with no long term relief.   Most of the pain is located Left posterior heel .  ESWT administered and tolerated well.Treatment settings initiated at:   Energy: 15  Ended treatment session today with 3000 shocks at the following settings:   Energy: 15  Frequency: 6.0  Joules: 14.72   Reviewed post EPAT instructions. Advised to avoid ice and NSAIDs throughout the treatment process and to utilize boot or supportive shoes for at least the next 3 days.  Follow up for #2 treatment in 1 week.

## 2020-03-11 ENCOUNTER — Ambulatory Visit: Payer: 59 | Admitting: Podiatry

## 2020-03-13 ENCOUNTER — Other Ambulatory Visit: Payer: Self-pay

## 2020-03-13 ENCOUNTER — Ambulatory Visit: Payer: 59 | Admitting: Obstetrics and Gynecology

## 2020-03-13 ENCOUNTER — Encounter: Payer: Self-pay | Admitting: Obstetrics and Gynecology

## 2020-03-13 VITALS — BP 118/80 | HR 89 | Ht 67.0 in | Wt 359.0 lb

## 2020-03-13 DIAGNOSIS — N760 Acute vaginitis: Secondary | ICD-10-CM | POA: Diagnosis not present

## 2020-03-13 LAB — WET PREP FOR TRICH, YEAST, CLUE

## 2020-03-13 NOTE — Progress Notes (Signed)
GYNECOLOGY  VISIT   HPI: 35 y.o.   married  [place] American  female   [place] with Patient's last menstrual period was 02/19/2020.   here for infection check. Patient c/o having odor and light brownish discharge. Patient denies having any urinary symptoms.  No itching or burning.   Using SSS tonic and wonders if this is having an effect on her vaginal odor.  Having some irregular menstrual bleeding and low iron.  Having fatigue.   Doing weekly pregnancy tests which have been negative.   Husband is interested in childbearing and patient is wanting to wait until her orthopedic issues are under control.   Had achilles tendon lengthening and then removal of bone spurs.  She is on medical leave.  Now doing shock wave therapy.  Stopped steroids. Last abx was September or October.   GYNECOLOGIC HISTORY: Patient's last menstrual period was 02/19/2020. Contraception:  none Menopausal hormone therapy:  n/a Last mammogram:  n/a Last pap smear:   04/28/17 Neg:Neg HR HPV        OB History    Gravida  0   Para  0   Term  0   Preterm  0   AB  0   Living  0     SAB  0   IAB  0   Ectopic  0   Multiple  0   Live Births  0              Patient Active Problem List   Diagnosis Date Noted  . Bile salt-induced diarrhea 12/20/2019  . Dysfunction of left eustachian tube 05/25/2018  . Cervical polyp 04/28/2017  . Morbid obesity ([facility]) 04/28/2017  . Hyperprolactinemia ([facility]) 02/11/2017  . Pituitary adenoma ([facility]) 02/11/2017  . Sickle cell trait ([facility]) 02/01/2017  . Iron deficiency anemia due to chronic blood loss 11/04/2016    Past Medical History:  Diagnosis Date  . Adenoma of pituitary ([facility]) 08/2014  . Anemia   . Elevated ferritin level   . Heel spur, left   . IBS (irritable bowel syndrome)   . Low iron   . Morbid obesity ([facility])   . Seasonal allergies   . Sickle cell trait ([facility])   . Vitamin D deficiency     Past Surgical History:  Procedure Laterality Date  .  BLEPHAROPLASTY Right    lower lid x 3  . CHOLECYSTECTOMY  2016  . ELECTROLYSIS OF MISDIRECTED LASHES Right 08/04/2017   Procedure: ELECTROLYSIS OF MISDIRECTED LASHES RIGHT EYE;  Surgeon: Clista Bernhardt, MD;  Location: [facility];  Service: Ophthalmology;  Laterality: Right;  . ENTROPIAN REPAIR Right 08/04/2017   Procedure: REPAIR OF ENTROPION WITH TARSAL STRIP RIGHT EYE;  Surgeon: Clista Bernhardt, MD;  Location: [facility];  Service: Ophthalmology;  Laterality: Right;  . HYSTEROSCOPY WITH D & C N/A 06/27/2017   Procedure: DILATATION & CURETTAGE/HYSTEROSCOPY;  Surgeon: Nunzio Cobbs, MD;  Location: [facility];  Service: Gynecology;  Laterality: N/A;  . iud placement     been removed    Current Outpatient Medications  Medication Sig Dispense Refill  . acetaminophen (TYLENOL) 500 MG tablet Take 1 tablet (500 mg total) by mouth every 6 (six) hours as needed for mild pain or moderate pain. 30 tablet 0  . cabergoline (DOSTINEX) 0.5 MG tablet Take 0.5 tablets (0.25 mg total) by mouth once a week. 10 tablet 2  . colestipol (COLESTID) 1 g tablet Take 1 tablet (1 g total) by mouth 2 (  two) times daily. 60 tablet 12  . ibuprofen (ADVIL) 800 MG tablet Take 1 tablet (800 mg total) by mouth every 8 (eight) hours as needed. Take with food. 30 tablet 0  . Iron-Vitamins (S.S.S. TONIC PO) Take by mouth.    Marland Kitchen VITAMIN D, ERGOCALCIFEROL, PO Take 3,000 Units by mouth daily.     No current facility-administered medications for this visit.     ALLERGIES: Food and Pollinex-t [modified tree tyrosine adsorbate]  Family History  Adopted: Yes  Problem Relation Age of Onset  . Sickle cell trait Mother   . Heart disease Father   . Obesity Father     Social History   Socioeconomic History  . Marital status: Single    Spouse name: Not on file  . Number of children: 0  . Years of education: Not on file  . Highest education level: Not on file  Occupational History  . 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 Smoking status: Never Smoker  . Smokeless tobacco: Never Used  Vaping Use  . Vaping Use: Never used  Substance and Sexual Activity  . Alcohol use: Yes    Alcohol/week: 2.0 standard drinks    Types: 2 Glasses of wine per week  . Drug use: No  . Sexual activity: Yes    Birth control/protection: Condom  Other Topics Concern  . Not on file  Social History Narrative  . Not on file   Social Determinants of Health   Financial Resource Strain: Not on file  Food Insecurity: Not on file  Transportation Needs: Not on file  Physical Activity: Not on file  Stress: Not on file  Social Connections: Not on file  Intimate Partner Violence: Not on file    Review of Systems  Constitutional: Negative.   HENT: Negative.   Eyes: Negative.   Respiratory: Negative.   Cardiovascular: Negative.   Gastrointestinal: Negative.   Endocrine: Negative.   Genitourinary: Positive for vaginal discharge.       Vaginal odor  Musculoskeletal: Negative.   Skin: Negative.   Allergic/Immunologic: Negative.   Neurological: Negative.   Hematological: Negative.   Psychiatric/Behavioral: Negative.     PHYSICAL EXAMINATION:    BP 118/80   Pulse 89   Ht 5\' 7"  (1.702 m)   Wt (!) 359 lb (162.8 kg)   LMP 02/19/2020   SpO2 96%   BMI 56.23 kg/m     General appearance: alert, cooperative and appears stated age  Pelvic: External genitalia:  no lesions              Urethra:  normal appearing urethra with no masses, tenderness or lesions              Bartholins and Skenes: normal                 Vagina: normal appearing vagina with normal color and discharge, no lesions              Cervix: no lesions                Bimanual Exam:  Uterus:  normal size, contour, position, consistency, mobility, non-tender              Adnexa: no mass, fullness, tenderness              Chaperone was present for exam.  ASSESSMENT  Vaginitis.  Hx prolactinoma.  On Dostinex.  Desire for pregnancy.    PLAN  Wet prep:  negative yeast, clue cells, and trichomonas.  No treatment today.  Patient will stop the SSS tonic.  Start PNV. Continue Dostinex.  Fu for annual exam.    14 min  total time was spent for this patient encounter, including preparation, face-to-face counseling with the patient, coordination of care, and documentation of the encounter.

## 2020-03-14 ENCOUNTER — Ambulatory Visit: Payer: 59

## 2020-03-14 ENCOUNTER — Ambulatory Visit (INDEPENDENT_AMBULATORY_CARE_PROVIDER_SITE_OTHER): Payer: 59

## 2020-03-14 DIAGNOSIS — M7732 Calcaneal spur, left foot: Secondary | ICD-10-CM

## 2020-03-14 NOTE — Progress Notes (Signed)
Patient presents for the 2nd EPAT treatment today with complaint of left posterior heel pain. Diagnosed with Calcaneal spur of the left foot by Dr.Price. This has been ongoing for several months. The patient has tried ice, stretching, NSAIDS and supportive shoe gear with no long term relief.   Most of the pain is located Left posterior heel . Patient mentioned during session there was improvement from previous session, pleased with EPAT results.  ESWT administered and tolerated well.Treatment settings initiated at:   Energy: 20  Ended treatment session today with 3000 shocks at the following settings:   Energy: 20  Frequency: 5.0  Joules: 19.62   Reviewed post EPAT instructions. Advised to avoid ice and NSAIDs throughout the treatment process and to utilize boot or supportive shoes for at least the next 3 days.

## 2020-03-18 ENCOUNTER — Ambulatory Visit (INDEPENDENT_AMBULATORY_CARE_PROVIDER_SITE_OTHER): Payer: 59 | Admitting: Podiatry

## 2020-03-18 ENCOUNTER — Other Ambulatory Visit: Payer: Self-pay

## 2020-03-18 DIAGNOSIS — M7662 Achilles tendinitis, left leg: Secondary | ICD-10-CM | POA: Diagnosis not present

## 2020-03-18 DIAGNOSIS — M7732 Calcaneal spur, left foot: Secondary | ICD-10-CM | POA: Diagnosis not present

## 2020-03-18 NOTE — Progress Notes (Signed)
Subjective:  Patient ID: Ana Padilla, female    DOB: [birth year],  MRN: [id number]  Chief Complaint  Patient presents with  . Foot Pain    Follow up Calcaneal spur of left foot. Pt. States she is still having same pain that is not improving. Becoming tight and stiff.    DOS: 09/19/19 Procedure: Resection haglund deformity with detachment/reattachment of Achilles tendon  35 y.o. female presents with the above complaint. History confirmed with patient.  Objective:  Physical Exam: No tenderness posterior achilles. ROM of the ankle to 10 deg dorsiflexion. POP inferior-lateral calcaneus. Incision: well healed with some keloid.  Assessment:   1. Calcaneal spur of left foot   2. Achilles tendinitis of left lower extremity     Plan:  Patient was evaluated and treated and all questions answered.  Post-operative State -She is doing ok. She actually has great ROM. She does have some keloid scar that is likely contributing to pain. Will consider injection at this area if issues persist -All her pain is on the bottom of the heel, not at the plantar fascial insertion. Likely will need padding. -Continue EPAT  Return in about 3 weeks (around 04/08/2020) for Achilles tendonitis f/u.

## 2020-03-19 ENCOUNTER — Ambulatory Visit: Payer: 59 | Admitting: Family Medicine

## 2020-03-19 ENCOUNTER — Other Ambulatory Visit: Payer: Self-pay | Admitting: Family Medicine

## 2020-03-19 ENCOUNTER — Encounter: Payer: Self-pay | Admitting: Family Medicine

## 2020-03-19 VITALS — BP 120/90 | HR 88 | Temp 98.6°F | Wt 360.6 lb

## 2020-03-19 DIAGNOSIS — G4700 Insomnia, unspecified: Secondary | ICD-10-CM | POA: Diagnosis not present

## 2020-03-19 DIAGNOSIS — R5383 Other fatigue: Secondary | ICD-10-CM

## 2020-03-19 DIAGNOSIS — F321 Major depressive disorder, single episode, moderate: Secondary | ICD-10-CM

## 2020-03-19 DIAGNOSIS — F419 Anxiety disorder, unspecified: Secondary | ICD-10-CM

## 2020-03-19 DIAGNOSIS — R635 Abnormal weight gain: Secondary | ICD-10-CM

## 2020-03-19 DIAGNOSIS — E559 Vitamin D deficiency, unspecified: Secondary | ICD-10-CM

## 2020-03-19 LAB — VITAMIN B12: Vitamin B-12: 274 pg/mL (ref 211–911)

## 2020-03-19 LAB — CBC WITH DIFFERENTIAL/PLATELET
Basophils Absolute: 0 10*3/uL (ref 0.0–0.1)
Basophils Relative: 0.5 % (ref 0.0–3.0)
Eosinophils Absolute: 0.2 10*3/uL (ref 0.0–0.7)
Eosinophils Relative: 3.3 % (ref 0.0–5.0)
HCT: 32.7 % — ABNORMAL LOW (ref 36.0–46.0)
Hemoglobin: 10.8 g/dL — ABNORMAL LOW (ref 12.0–15.0)
Lymphocytes Relative: 26 % (ref 12.0–46.0)
Lymphs Abs: 1.7 10*3/uL (ref 0.7–4.0)
MCHC: 32.9 g/dL (ref 30.0–36.0)
MCV: 66.5 fl — ABNORMAL LOW (ref 78.0–100.0)
Monocytes Absolute: 0.4 10*3/uL (ref 0.1–1.0)
Monocytes Relative: 5.8 % (ref 3.0–12.0)
Neutro Abs: 4.1 10*3/uL (ref 1.4–7.7)
Neutrophils Relative %: 64.4 % (ref 43.0–77.0)
Platelets: 412 10*3/uL — ABNORMAL HIGH (ref 150.0–400.0)
RBC: 4.92 Mil/uL (ref 3.87–5.11)
RDW: 16.8 % — ABNORMAL HIGH (ref 11.5–15.5)
WBC: 6.4 10*3/uL (ref 4.0–10.5)

## 2020-03-19 LAB — IBC PANEL
Iron: 49 ug/dL (ref 42–145)
Saturation Ratios: 13.6 % — ABNORMAL LOW (ref 20.0–50.0)
Transferrin: 258 mg/dL (ref 212.0–360.0)

## 2020-03-19 LAB — VITAMIN D 25 HYDROXY (VIT D DEFICIENCY, FRACTURES): VITD: 28.58 ng/mL — ABNORMAL LOW (ref 30.00–100.00)

## 2020-03-19 LAB — TSH: TSH: 1.24 u[IU]/mL (ref 0.35–4.50)

## 2020-03-19 LAB — T4, FREE: Free T4: 0.77 ng/dL (ref 0.60–1.60)

## 2020-03-19 MED ORDER — SERTRALINE HCL 25 MG PO TABS: 25.0000 mg | ORAL_TABLET | Freq: Every day | ORAL | 2 refills | Status: DC

## 2020-03-19 MED ORDER — VITAMIN D (ERGOCALCIFEROL) 1.25 MG (50000 UNIT) PO CAPS: 50000.0000 [IU] | ORAL_CAPSULE | ORAL | 0 refills | Status: DC

## 2020-03-19 NOTE — Patient Instructions (Signed)
Managing Loss, Adult People experience loss in many different ways throughout their lives. Events such as moving, changing jobs, and losing friends can create a sense of loss. The loss may be as serious as a major health change, divorce, death of a pet, or death of a loved one. All of these types of loss are likely to create a physical and emotional reaction known as grief. Grief is the result of a major change or an absence of something or someone that you count on. Grief is a normal reaction to loss. A variety of factors can affect your grieving experience, including:  The nature of your loss.  Your relationship to what or whom you lost.  Your understanding of grief and how to manage it.  Your support system. How to manage lifestyle changes Keep to your normal routine as much as possible.  If you have trouble focusing or doing normal activities, it is acceptable to take some time away from your normal routine.  Spend time with friends and loved ones.  Eat a healthy diet, get plenty of sleep, and rest when you feel tired.   How to recognize changes  The way that you deal with your grief will affect your ability to function as you normally do. When grieving, you may experience these changes:  Numbness, shock, sadness, anxiety, anger, denial, and guilt.  Thoughts about death.  Unexpected crying.  A physical sensation of emptiness in your stomach.  Problems sleeping and eating.  Tiredness (fatigue).  Loss of interest in normal activities.  Dreaming about or imagining seeing the person who died.  A need to remember what or whom you lost.  Difficulty thinking about anything other than your loss for a period of time.  Relief. If you have been expecting the loss for a while, you may feel a sense of relief when it happens. Follow these instructions at home: Activity Express your feelings in healthy ways, such as:  Talking with others about your loss. It may be helpful to find  others who have had a similar loss, such as a support group.  Writing down your feelings in a journal.  Doing physical activities to release stress and emotional energy.  Doing creative activities like painting, sculpting, or playing or listening to music.  Practicing resilience. This is the ability to recover and adjust after facing challenges. Reading some resources that encourage resilience may help you to learn ways to practice those behaviors.   General instructions  Be patient with yourself and others. Allow the grieving process to happen, and remember that grieving takes time. ? It is likely that you may never feel completely done with some grief. You may find a way to move on while still cherishing memories and feelings about your loss. ? Accepting your loss is a process. It can take months or longer to adjust.  Keep all follow-up visits as told by your health care provider. This is important. Where to find support To get support for managing loss:  Ask your health care provider for help and recommendations, such as grief counseling or therapy.  Think about joining a support group for people who are managing a loss. Where to find more information You can find more information about managing loss from:  American Society of Clinical Oncology: www.cancer.net  American Psychological Association: www.apa.org Contact a health care provider if:  Your grief is extreme and keeps getting worse.  You have ongoing grief that does not improve.  Your body shows symptoms   of grief, such as illness.  You feel depressed, anxious, or lonely. Get help right away if:  You have thoughts about hurting yourself or others. If you ever feel like you may hurt yourself or others, or have thoughts about taking your own life, get help right away. You can go to your nearest emergency department or call:  Your local emergency services (911 in the [place]  A suicide crisis helpline, such as the  [place] at (661)873-9271. This is open 24 hours a day. Summary  Grief is the result of a major change or an absence of someone or something that you count on. Grief is a normal reaction to loss.  The depth of grief and the period of recovery depend on the type of loss and your ability to adjust to the change and process your feelings.  Processing grief requires patience and a willingness to accept your feelings and talk about your loss with people who are supportive.  It is important to find resources that work for you and to realize that people experience grief differently. There is not one grieving process that works for everyone in the same way.  Be aware that when grief becomes extreme, it can lead to more severe issues like isolation, depression, anxiety, or suicidal thoughts. Talk with your health care provider if you have any of these issues. This information is not intended to replace advice given to you by your health care provider. Make sure you discuss any questions you have with your health care provider. Document Revised: 06/14/2019 Document Reviewed: 06/14/2019 Elsevier Patient Education  [place].  http://APA.org/depression-guideline"> https://clinicalkey.com"> http://point-of-care.elsevierperformancemanager.com/skills/"> http://point-of-care.elsevierperformancemanager.com">  Managing Depression, Adult Depression is a mental health condition that affects your thoughts, feelings, and actions. Being diagnosed with depression can bring you relief if you did not know why you have felt or behaved a certain way. It could also leave you feeling overwhelmed with uncertainty about your future. Preparing yourself to manage your symptoms can help you feel more positive about your future. How to manage lifestyle changes Managing stress Stress is your body's reaction to life changes and events, both good and bad. Stress can add to your feelings of  depression. Learning to manage your stress can help lessen your feelings of depression. Try some of the following approaches to reducing your stress (stress reduction techniques):  Listen to music that you enjoy and that inspires you.  Try using a meditation app or take a meditation class.  Develop a practice that helps you connect with your spiritual self. Walk in nature, pray, or go to a place of worship.  Do some deep breathing. To do this, inhale slowly through your nose. Pause at the top of your inhale for a few seconds and then exhale slowly, letting your muscles relax.  Practice yoga to help relax and work your muscles. Choose a stress reduction technique that suits your lifestyle and personality. These techniques take time and practice to develop. Set aside 5-15 minutes a day to do them. Therapists can offer training in these techniques. Other things you can do to manage stress include:  Keeping a stress diary.  Knowing your limits and saying no when you think something is too much.  Paying attention to how you react to certain situations. You may not be able to control everything, but you can change your reaction.  Adding humor to your life by watching funny films or TV shows.  Making time for activities that you enjoy and  that relax you.   Medicines Medicines, such as antidepressants, are often a part of treatment for depression.  Talk with your pharmacist or health care provider about all the medicines, supplements, and herbal products that you take, their possible side effects, and what medicines and other products are safe to take together.  Make sure to report any side effects you may have to your health care provider. Relationships Your health care provider may suggest family therapy, couples therapy, or individual therapy as part of your treatment. How to recognize changes Everyone responds differently to treatment for depression. As you recover from depression, you  may start to:  Have more interest in doing activities.  Feel less hopeless.  Have more energy.  Overeat less often, or have a better appetite.  Have better mental focus. It is important to recognize if your depression is not getting better or is getting worse. The symptoms you had in the beginning may return, such as:  Tiredness (fatigue) or low energy.  Eating too much or too little.  Sleeping too much or too little.  Feeling restless, agitated, or hopeless.  Trouble focusing or making decisions.  Unexplained physical complaints.  Feeling irritable, angry, or aggressive. If you or your family members notice these symptoms coming back, let your health care provider know right away. Follow these instructions at home: Activity  Try to get some form of exercise each day, such as walking, biking, swimming, or lifting weights.  Practice stress reduction techniques.  Engage your mind by taking a class or doing some volunteer work.   Lifestyle  Get the right amount and quality of sleep.  Cut down on using caffeine, tobacco, alcohol, and other potentially harmful substances.  Eat a healthy diet that includes plenty of vegetables, fruits, whole grains, low-fat dairy products, and lean protein. Do not eat a lot of foods that are high in solid fats, added sugars, or salt (sodium). General instructions  Take over-the-counter and prescription medicines only as told by your health care provider.  Keep all follow-up visits as told by your health care provider. This is important. Where to find support Talking to others Friends and family members can be sources of support and guidance. Talk to trusted friends or family members about your condition. Explain your symptoms to them, and let them know that you are working with a health care provider to treat your depression. Tell friends and family members how they also can be helpful.   Finances  Find appropriate mental health  providers that fit with your financial situation.  Talk with your health care provider about options to get reduced prices on your medicines. Where to find more information You can find support in your area from:  Anxiety and Depression Association of America (ADAA): www.adaa.org  Mental Health America: www.mentalhealthamerica.net  [employer] on Mental Illness: www.nami.org Contact a health care provider if:  You stop taking your antidepressant medicines, and you have any of these symptoms: ? Nausea. ? Headache. ? Light-headedness. ? Chills and body aches. ? Not being able to sleep (insomnia).  You or your friends and family think your depression is getting worse. Get help right away if:  You have thoughts of hurting yourself or others. If you ever feel like you may hurt yourself or others, or have thoughts about taking your own life, get help right away. Go to your nearest emergency department or:  Call your local emergency services (911 in the [place]  Call a suicide crisis helpline, such  as the [place] at 220 015 2151. This is open 24 hours a day in the [place]  Text the Crisis Text Line at 317-719-6945 (in the [place].). Summary  If you are diagnosed with depression, preparing yourself to manage your symptoms is a good way to feel positive about your future.  Work with your health care provider on a management plan that includes stress reduction techniques, medicines (if applicable), therapy, and healthy lifestyle habits.  Keep talking with your health care provider about how your treatment is working.  If you have thoughts about taking your own life, call a suicide crisis helpline or text a crisis text line. This information is not intended to replace advice given to you by your health care provider. Make sure you discuss any questions you have with your health care provider. Document Revised: 11/01/2018 Document Reviewed: 11/01/2018 Elsevier  Patient Education  2021 [place], Adult After being diagnosed with an anxiety disorder, you may be relieved to know why you have felt or behaved a certain way. You may also feel overwhelmed about the treatment ahead and what it will mean for your life. With care and support, you can manage this condition and recover from it. How to manage lifestyle changes Managing stress and anxiety Stress is your body's reaction to life changes and events, both good and bad. Most stress will last just a few hours, but stress can be ongoing and can lead to more than just stress. Although stress can play a major role in anxiety, it is not the same as anxiety. Stress is usually caused by something external, such as a deadline, test, or competition. Stress normally passes after the triggering event has ended.  Anxiety is caused by something internal, such as imagining a terrible outcome or worrying that something will go wrong that will devastate you. Anxiety often does not go away even after the triggering event is over, and it can become long-term (chronic) worry. It is important to understand the differences between stress and anxiety and to manage your stress effectively so that it does not lead to an anxious response. Talk with your health care provider or a counselor to learn more about reducing anxiety and stress. He or she may suggest tension reduction techniques, such as:  Music therapy. This can include creating or listening to music that you enjoy and that inspires you.  Mindfulness-based meditation. This involves being aware of your normal breaths while not trying to control your breathing. It can be done while sitting or walking.  Centering prayer. This involves focusing on a word, phrase, or sacred image that means something to you and brings you peace.  Deep breathing. To do this, expand your stomach and inhale slowly through your nose. Hold your breath for 3-5 seconds. Then  exhale slowly, letting your stomach muscles relax.  Self-talk. This involves identifying thought patterns that lead to anxiety reactions and changing those patterns.  Muscle relaxation. This involves tensing muscles and then relaxing them. Choose a tension reduction technique that suits your lifestyle and personality. These techniques take time and practice. Set aside 5-15 minutes a day to do them. Therapists can offer counseling and training in these techniques. The training to help with anxiety may be covered by some insurance plans. Other things you can do to manage stress and anxiety include:  Keeping a stress/anxiety diary. This can help you learn what triggers your reaction and then learn ways to manage your response.  Thinking about how  you react to certain situations. You may not be able to control everything, but you can control your response.  Making time for activities that help you relax and not feeling guilty about spending your time in this way.  Visual imagery and yoga can help you stay calm and relax.   Medicines Medicines can help ease symptoms. Medicines for anxiety include:  Anti-anxiety drugs.  Antidepressants. Medicines are often used as a primary treatment for anxiety disorder. Medicines will be prescribed by a health care provider. When used together, medicines, psychotherapy, and tension reduction techniques may be the most effective treatment. Relationships Relationships can play a big part in helping you recover. Try to spend more time connecting with trusted friends and family members. Consider going to couples counseling, taking family education classes, or going to family therapy. Therapy can help you and others better understand your condition. How to recognize changes in your anxiety Everyone responds differently to treatment for anxiety. Recovery from anxiety happens when symptoms decrease and stop interfering with your daily activities at home or work. This  may mean that you will start to:  Have better concentration and focus. Worry will interfere less in your daily thinking.  Sleep better.  Be less irritable.  Have more energy.  Have improved memory. It is important to recognize when your condition is getting worse. Contact your health care provider if your symptoms interfere with home or work and you feel like your condition is not improving. Follow these instructions at home: Activity  Exercise. Most adults should do the following: ? Exercise for at least 150 minutes each week. The exercise should increase your heart rate and make you sweat (moderate-intensity exercise). ? Strengthening exercises at least twice a week.  Get the right amount and quality of sleep. Most adults need 7-9 hours of sleep each night. Lifestyle  Eat a healthy diet that includes plenty of vegetables, fruits, whole grains, low-fat dairy products, and lean protein. Do not eat a lot of foods that are high in solid fats, added sugars, or salt.  Make choices that simplify your life.  Do not use any products that contain nicotine or tobacco, such as cigarettes, e-cigarettes, and chewing tobacco. If you need help quitting, ask your health care provider.  Avoid caffeine, alcohol, and certain over-the-counter cold medicines. These may make you feel worse. Ask your pharmacist which medicines to avoid.   General instructions  Take over-the-counter and prescription medicines only as told by your health care provider.  Keep all follow-up visits as told by your health care provider. This is important. Where to find support You can get help and support from these sources:  Self-help groups.  Online and [employer].  A trusted spiritual leader.  Couples counseling.  Family education classes.  Family therapy. Where to find more information You may find that joining a support group helps you deal with your anxiety. The following sources can help you  locate counselors or support groups near you:  [place]: www.mentalhealthamerica.net  Anxiety and Depression Association of [place] (ADAA): https://www.clark.net/  National Alliance on Mental Illness (NAMI): www.nami.org Contact a health care provider if you:  Have a hard time staying focused or finishing daily tasks.  Spend many hours a day feeling worried about everyday life.  Become exhausted by worry.  Start to have headaches, feel tense, or have nausea.  Urinate more than normal.  Have diarrhea. Get help right away if you have:  A racing heart and shortness of breath.  Thoughts of hurting yourself or others. If you ever feel like you may hurt yourself or others, or have thoughts about taking your own life, get help right away. You can go to your nearest emergency department or call:  Your local emergency services (911 in the [place]  A suicide crisis helpline, such as the [place] at 716-659-1322. This is open 24 hours a day. Summary  Taking steps to learn and use tension reduction techniques can help calm you and help prevent triggering an anxiety reaction.  When used together, medicines, psychotherapy, and tension reduction techniques may be the most effective treatment.  Family, friends, and partners can play a big part in helping you recover from an anxiety disorder. This information is not intended to replace advice given to you by your health care provider. Make sure you discuss any questions you have with your health care provider. Document Revised: 05/23/2018 Document Reviewed: 05/23/2018 Elsevier Patient Education  2021 [place]. Sertraline Tablets What is this medicine? SERTRALINE (SER tra leen) is used to treat depression. It may also be used to treat obsessive compulsive disorder, panic disorder, post-trauma stress disorder, premenstrual dysphoric disorder (PMDD) or social anxiety. This medicine may be used for other  purposes; ask your health care provider or pharmacist if you have questions. COMMON BRAND NAME(S): Zoloft What should I tell my health care provider before I take this medicine? They need to know if you have any of these conditions:  bleeding disorders  bipolar disorder or a family history of bipolar disorder  glaucoma  heart disease  high blood pressure  history of irregular heartbeat  history of low levels of calcium, magnesium, or potassium in the blood  if you often drink alcohol  liver disease  receiving electroconvulsive therapy  seizures  suicidal thoughts, plans, or attempt; a previous suicide attempt by you or a family member  take medicines that treat or prevent blood clots  thyroid disease  an unusual or allergic reaction to sertraline, other medicines, foods, dyes, or preservatives  pregnant or trying to get pregnant  breast-feeding How should I use this medicine? Take this medicine by mouth with a glass of water. Follow the directions on the prescription label. You can take it with or without food. Take your medicine at regular intervals. Do not take your medicine more often than directed. Do not stop taking this medicine suddenly except upon the advice of your doctor. Stopping this medicine too quickly may cause serious side effects or your condition may worsen. A special MedGuide will be given to you by the pharmacist with each prescription and refill. Be sure to read this information carefully each time. Talk to your pediatrician regarding the use of this medicine in children. While this drug may be prescribed for children as young as 7 years for selected conditions, precautions do apply. Overdosage: If you think you have taken too much of this medicine contact a poison control center or emergency room at once. NOTE: This medicine is only for you. Do not share this medicine with others. What if I miss a dose? If you miss a dose, take it as soon as you can.  If it is almost time for your next dose, take only that dose. Do not take double or extra doses. What may interact with this medicine? Do not take this medicine with any of the following medications:  cisapride  dronedarone  linezolid  MAOIs like Carbex, Eldepryl, Marplan, Nardil, and Parnate  methylene blue (injected  into a vein)  pimozide  thioridazine This medicine may also interact with the following medications:  alcohol  amphetamines  aspirin and aspirin-like medicines  certain medicines for depression, anxiety, or psychotic disturbances  certain medicines for fungal infections like ketoconazole, fluconazole, posaconazole, and itraconazole  certain medicines for irregular heart beat like flecainide, quinidine, propafenone  certain medicines for migraine headaches like almotriptan, eletriptan, frovatriptan, naratriptan, rizatriptan, sumatriptan, zolmitriptan  certain medicines for sleep  certain medicines for seizures like carbamazepine, valproic acid, phenytoin  certain medicines that treat or prevent blood clots like warfarin, enoxaparin, dalteparin  cimetidine  digoxin  diuretics  fentanyl  isoniazid  lithium  NSAIDs, medicines for pain and inflammation, like ibuprofen or naproxen  other medicines that prolong the QT interval (cause an abnormal heart rhythm) like dofetilide  rasagiline  safinamide  supplements like St. John's wort, kava kava, valerian  tolbutamide  tramadol  tryptophan This list may not describe all possible interactions. Give your health care provider a list of all the medicines, herbs, non-prescription drugs, or dietary supplements you use. Also tell them if you smoke, drink alcohol, or use illegal drugs. Some items may interact with your medicine. What should I watch for while using this medicine? Tell your doctor if your symptoms do not get better or if they get worse. Visit your doctor or health care professional for  regular checks on your progress. Because it may take several weeks to see the full effects of this medicine, it is important to continue your treatment as prescribed by your doctor. Patients and their families should watch out for new or worsening thoughts of suicide or depression. Also watch out for sudden changes in feelings such as feeling anxious, agitated, panicky, irritable, hostile, aggressive, impulsive, severely restless, overly excited and hyperactive, or not being able to sleep. If this happens, especially at the beginning of treatment or after a change in dose, call your health care professional. Dennis Bast may get drowsy or dizzy. Do not drive, use machinery, or do anything that needs mental alertness until you know how this medicine affects you. Do not stand or sit up quickly, especially if you are an older patient. This reduces the risk of dizzy or fainting spells. Alcohol may interfere with the effect of this medicine. Avoid alcoholic drinks. Your mouth may get dry. Chewing sugarless gum or sucking hard candy, and drinking plenty of water may help. Contact your doctor if the problem does not go away or is severe. What side effects may I notice from receiving this medicine? Side effects that you should report to your doctor or health care professional as soon as possible:  allergic reactions like skin rash, itching or hives, swelling of the face, lips, or tongue  anxious  black, tarry stools  changes in vision  confusion  elevated mood, decreased need for sleep, racing thoughts, impulsive behavior  eye pain  fast, irregular heartbeat  feeling faint or lightheaded, falls  feeling agitated, angry, or irritable  hallucination, loss of contact with reality  loss of balance or coordination  loss of memory  painful or prolonged erections  restlessness, pacing, inability to keep still  seizures  stiff muscles  suicidal thoughts or other mood changes  trouble  sleeping  unusual bleeding or bruising  unusually weak or tired  vomiting Side effects that usually do not require medical attention (report to your doctor or health care professional if they continue or are bothersome):  change in appetite or weight  change in sex  drive or performance  diarrhea  increased sweating  indigestion, nausea  tremors This list may not describe all possible side effects. Call your doctor for medical advice about side effects. You may report side effects to FDA at 1-800-FDA-1088. Where should I keep my medicine? Keep out of the reach of children. Store at room temperature between 15 and 30 degrees C (59 and 86 degrees F). Throw away any unused medicine after the expiration date. NOTE: This sheet is a summary. It may not cover all possible information. If you have questions about this medicine, talk to your doctor, pharmacist, or health care provider.  2021 Elsevier/Gold Standard (2019-11-01 09:18:23)

## 2020-03-19 NOTE — Progress Notes (Signed)
Subjective:    Patient ID: Ana Padilla, female    DOB: 1985-09-16, 35 y.o.   MRN: [id number]  Chief Complaint  Patient presents with  . Fatigue    HPI Patient is a 35 year old female with pmh sig for IBS, pituitary adenoma, hyperprolactinemia, left heel spur, iron deficiency anemia, seasonal allergies, obesity, sickle cell trait, vitamin D deficiency who was seen today for f/u.  Pt endorses increased fatigue and depression.  Concerned about recurrent anemia.  Still grieving from the death of her grandmother and a close friend.  Pt notes crying spells, feeling short tempered, decreased motivation, increased anxiety, and insomnia.  Mind races at night making it difficult to fall asleep.  Patient tried taking melatonin 10 mg.  Pt taking care of her step children and in school for IT.  In the past patient tried Wellbutrin but it caused her to feel tired and "weird".  Patient considering counseling.  Patient unsure of family history as she was adopted.  Denies personal history of thyroid cancer or seizures.  Having continued L foot pain 2/2 Achilles tendinitis and heel spur, was taken out of work.  Seen by podiatry, Dr. Samuella Cota.  Foot pain making it difficult to lose weight.  Ordering food as having less energy.  Past Medical History:  Diagnosis Date  . 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 Anemia   . Elevated ferritin level   . Heel spur, left   . IBS (irritable bowel syndrome)   . Low iron   . Morbid obesity (HCC)   . Seasonal allergies   . Sickle cell trait (HCC)   . Vitamin D deficiency     Allergies  Allergen Reactions  . Food Other (See Comments)    Seasonal Allergies & fresh fruit---itchy mouth&nose   . Pollinex-T [Modified Tree Tyrosine Adsorbate]     Itchy nose and eyes, ears, runny nose,     ROS General: Denies fever, chills, night sweats, changes in appetite  + fatigue, insomnia, weight gain HEENT: Denies headaches, ear pain, changes in vision, rhinorrhea, sore throat CV:  Denies CP, palpitations, SOB, orthopnea Pulm: Denies SOB, cough, wheezing GI: Denies abdominal pain, nausea, vomiting, diarrhea, constipation GU: Denies dysuria, hematuria, frequency, vaginal discharge Msk: Denies muscle cramps, joint pains Neuro: Denies weakness, numbness, tingling Skin: Denies rashes, bruising Psych: Denies hallucinations  +anxiety, depression, grief    Objective:    Blood pressure 120/90, pulse 88, temperature 98.6 F (37 C), temperature source Oral, weight (!) 360 lb 9.6 oz (163.6 kg), last menstrual period 02/19/2020, SpO2 96 %.   Gen. Pleasant, well-nourished, obese, in no distress, normal affect   HEENT: [place]/AT, face symmetric, conjunctiva clear, no scleral icterus, PERRLA, EOMI, nares patent without drainage Lungs: no accessory muscle use, CTAB, no wheezes or rales Cardiovascular: RRR, no m/r/g, no peripheral edema Musculoskeletal: No deformities, no cyanosis or clubbing, normal tone Neuro:  A&Ox3, CN II-XII intact, normal gait Skin:  Warm, no lesions/ rash   Wt Readings from Last 3 Encounters:  03/19/20 (!) 360 lb 9.6 oz (163.6 kg)  03/13/20 (!) 359 lb (162.8 kg)  12/20/19 (!) 357 lb (161.9 kg)    Lab Results  Component Value Date   WBC 6.9 08/09/2019   HGB 10.8 (L) 08/09/2019   HCT 36.1 08/09/2019   PLT 460 (H) 08/09/2019   GLUCOSE 92 08/09/2019   CHOL 176 08/09/2019   TRIG 115 08/09/2019   HDL 72 08/09/2019   LDLCALC 83 08/09/2019   ALT 11 08/09/2019  AST 15 08/09/2019   NA 138 08/09/2019   K 4.3 08/09/2019   CL 102 08/09/2019   CREATININE 0.75 08/09/2019   BUN 12 08/09/2019   CO2 30 08/09/2019   TSH 1.61 08/09/2019   INR 0.92 08/04/2017   HGBA1C 5.1 08/09/2019    Assessment/Plan:  Fatigue, unspecified type  -Discussed possible causes including chronic anemia thyroid dysfunction, anxiety, depression, vitamin deficiency. -Further recommendations based on labs - Plan: CBC with Differential/Platelet, Vitamin B12, Vitamin D,  25-hydroxy, TSH, T4, Free, IBC Panel([facility])  Insomnia, unspecified type -Discussed sleep hygiene -Anxiety likely contributing. -We will continue to monitor  Weight gain -Discussed lifestyle modifications -We will obtain labs -Consider medication options as patient would like to avoid surgery  Depression, major, single episode, moderate (HCC) -PHQ-9 score 15 -Given BH resources.  Consider EAP -Discussed the importance of self-care -Continue to monitor -Follow-up in 4-6 weeks, sooner if needed - Plan: sertraline (ZOLOFT) 25 MG tablet  Anxiety  -GAD-7 score 12 -In the past Wellbutrin caused fatigue -Continue deep breathing and other techniques to reduce anxiety symptoms -We will start Zoloft 25 mg -Follow-up in 4-6 weeks, sooner if needed - Plan: sertraline (ZOLOFT) 25 MG tablet  F/u 4-6 weeks, sooner if needed  Abbe Amsterdam, MD

## 2020-03-20 NOTE — Progress Notes (Signed)
Results viewed on MyChart. 

## 2020-03-24 ENCOUNTER — Ambulatory Visit (INDEPENDENT_AMBULATORY_CARE_PROVIDER_SITE_OTHER): Payer: 59 | Admitting: Podiatry

## 2020-03-24 ENCOUNTER — Other Ambulatory Visit: Payer: Self-pay

## 2020-03-24 DIAGNOSIS — M7732 Calcaneal spur, left foot: Secondary | ICD-10-CM

## 2020-03-24 NOTE — Progress Notes (Signed)
Patient presents for the 3rd EPAT treatment today with complaint of left posterior heel pain. Diagnosed with Calcaneal spur of the left foot by Dr.Price. This has been ongoing for several months. The patient has tried ice, stretching, NSAIDS and supportive shoe gear with no long term relief.   Most of the pain is located Left posterior heel . Patient mentioned during session there was improvement from previous session, pleased with EPAT results.  ESWT administered and tolerated well.Treatment settings initiated at:   Energy: 25  Ended treatment session today with 3000 shocks at the following settings:   Energy: 25  Frequency: 4.0  Joules: 24.52   Reviewed post EPAT instructions. Advised to avoid ice and NSAIDs throughout the treatment process and to utilize boot or supportive shoes for at least the next 3 days.  Follow-up for EPAT #4 in 2 weeks.

## 2020-04-01 ENCOUNTER — Telehealth: Payer: Self-pay | Admitting: Podiatry

## 2020-04-01 ENCOUNTER — Ambulatory Visit: Payer: 59 | Admitting: Podiatry

## 2020-04-01 NOTE — Telephone Encounter (Signed)
Pt is scheduled for EPAT on 3/4 then scheduled to see you the next day, should she reschedule your appt. Or is the next day ok, to see you.

## 2020-04-03 NOTE — Telephone Encounter (Signed)
Reschedule it so we can see how she feels afterwards

## 2020-04-07 ENCOUNTER — Other Ambulatory Visit: Payer: 59

## 2020-04-08 ENCOUNTER — Ambulatory Visit: Payer: 59 | Admitting: Podiatry

## 2020-04-15 ENCOUNTER — Other Ambulatory Visit: Payer: Self-pay

## 2020-04-15 ENCOUNTER — Ambulatory Visit (INDEPENDENT_AMBULATORY_CARE_PROVIDER_SITE_OTHER): Payer: 59 | Admitting: Podiatry

## 2020-04-15 DIAGNOSIS — M7662 Achilles tendinitis, left leg: Secondary | ICD-10-CM

## 2020-04-15 DIAGNOSIS — M7732 Calcaneal spur, left foot: Secondary | ICD-10-CM | POA: Diagnosis not present

## 2020-04-16 ENCOUNTER — Ambulatory Visit: Payer: 59 | Admitting: Family Medicine

## 2020-04-23 ENCOUNTER — Telehealth: Payer: Self-pay | Admitting: Urology

## 2020-04-23 NOTE — Telephone Encounter (Addendum)
DOS - 05/21/20  TENOLYSIS LEFT --- 18485  [employer] EFFECTIVE DATE -  10/21/19  PLAN DEDUCTIBLE - $550.00 W/ $550.00 REMAINING OUT OF POCKET - $1,100.00 W/ $1,100.00 REMAINING COINSURANCE - 20% COPAY - $75.00   SPOKE WITH ADRIANA V. WITH [employer] INSURANCE, SHE STATED THAT NO PRIOR AUTH IS REQUIRED FOR CPT CODE 92763. REV#[id number] [place]

## 2020-04-24 ENCOUNTER — Telehealth: Payer: Self-pay

## 2020-04-24 NOTE — Telephone Encounter (Signed)
Ana Padilla called to cancel her surgery with Dr. March Rummage on 05/07/2020. She stated she owes $1400.00 to [facility] and doesn't have the money to pay. Notified Dr. March Rummage and Caren Griffins with [facility]

## 2020-04-25 ENCOUNTER — Other Ambulatory Visit: Payer: 59

## 2020-04-25 ENCOUNTER — Telehealth: Payer: Self-pay | Admitting: Podiatry

## 2020-04-25 NOTE — Telephone Encounter (Signed)
Good Morning Dr. March Rummage  I need notes for 04/15/2020 visit, for her Disability. Thank you

## 2020-04-28 ENCOUNTER — Other Ambulatory Visit: Payer: Self-pay

## 2020-04-28 ENCOUNTER — Ambulatory Visit (INDEPENDENT_AMBULATORY_CARE_PROVIDER_SITE_OTHER): Payer: 59

## 2020-04-28 DIAGNOSIS — M7732 Calcaneal spur, left foot: Secondary | ICD-10-CM

## 2020-04-28 NOTE — Progress Notes (Signed)
Patient presents for the 4th EPAT treatment today with complaint of left posterior heel pain. Diagnosed with Calcaneal spur of the left foot by Dr.Price. This has been ongoing for several months. The patient has tried ice, stretching, NSAIDS and supportive shoe gear with no long term relief.   Most of the pain is located Left posterior heel . Patient mentioned during session there was improvement from previous session, pleased with EPAT results.  ESWT administered and tolerated well.Treatment settings initiated at:   Energy: 30 Ended treatment session today with 3000 shocks at the following settings:   Energy: 30  Frequency: 4.0  Joules: 29.43   Reviewed post EPAT instructions. Advised to avoid ice and NSAIDs throughout the treatment process and to utilize boot or supportive shoes for at least the next 3 days.  Follow-up with Dr. March Rummage in 6 weeks.

## 2020-04-29 NOTE — Progress Notes (Signed)
Subjective:  Patient ID: Ana Padilla, female    DOB: [birth year],  MRN: [id number]  Chief Complaint  Patient presents with  . Follow-up    3 week fu Achilles tendonitis- still having tenderness and swelling-tx: icing/meloxicam-had to r/s last epat-reports soreness and more agitated w/ shoes.    DOS: 09/19/19 Procedure: Resection haglund deformity with detachment/reattachment of Achilles tendon  35 y.o. female presents with the above complaint. History confirmed with patient.  Objective:  Physical Exam: + tenderness posterior achilles. ROM of the ankle to 10 deg dorsiflexion. POP inferior-lateral calcaneus. Incision: well healed with some keloid.  Assessment:   1. Calcaneal spur of left foot   2. Achilles tendinitis of left lower extremity     Plan:  Patient was evaluated and treated and all questions answered.  Post-operative State -Continue EPAT -Given that she has had continued pain.  I think Achilles tendon debridement is indicated.  We discussed performing micro debridement with radiofrequency ablation.  Patient verbalized understanding and wished to proceed.  We reviewed the consent form and will plan for the procedure in the coming weeks -We will keep her out of work at this time as I think her pain will prevent her standing for an extended period -Patient has failed all conservative therapy and wishes to proceed with surgical intervention. All risks, benefits, and alternatives discussed with patient. No guarantees given. Consent reviewed and signed by patient. -Planned procedures: left Achilles tendon microdebridement with radiofrequency ablation -ASA 3 - Patient with moderate systemic disease with functional limitations   -Post-op anticoagulation: chemoprophylaxis not indicated   No follow-ups on file.

## 2020-04-30 ENCOUNTER — Telehealth: Payer: Self-pay | Admitting: Podiatry

## 2020-04-30 NOTE — Telephone Encounter (Signed)
Ana Padilla was wondering if she could do a couple more EPATs before consenting to another surgery, to see if it helps. She would rather try that first, because she has to pay 1500.00 up front for surgery. But if you feel the surgery is needed then she will get the funds up. Also she is out of work until 06/04/2020, do you want me to keep that oow date?

## 2020-05-01 ENCOUNTER — Telehealth: Payer: Self-pay | Admitting: Podiatry

## 2020-05-01 NOTE — Telephone Encounter (Signed)
Can we talk to patient about having her surgery at the hospital? She will need H&P and everything. I'll get some dates together for you

## 2020-05-01 NOTE — Telephone Encounter (Signed)
I spoke with Ana Padilla and she would like to try the surgery at the Hospital, because she is starting to hurt a lot more.

## 2020-05-01 NOTE — Telephone Encounter (Signed)
Yes please re: work.   We can try EPAT but she would have to pay for those at this point. We could also consider moving her surgery to the hospital surgery center where she may not need an up front payment and could do a payment plan.  Let me know what she would like to do

## 2020-05-02 ENCOUNTER — Ambulatory Visit: Payer: 59 | Admitting: Podiatry

## 2020-05-07 NOTE — Progress Notes (Signed)
Pt surgery posted for [facility] on 05-21-2020 for Dr March Rummage.  Noted pt has BMI 56.48.  Per anesthesia ambulatory guidelines this pt is not a candidate for [facility] due to BMI greater than 50.  Called and left message for [place], OR for Dr March Rummage, informed her pt would need to be done at main OR.

## 2020-05-13 ENCOUNTER — Encounter: Payer: 59 | Admitting: Podiatry

## 2020-05-15 ENCOUNTER — Other Ambulatory Visit: Payer: Self-pay

## 2020-05-15 NOTE — Progress Notes (Signed)
Pt. Needs orderrs and H & P for upcomming surgery. PAT and labs: 05/16/20

## 2020-05-15 NOTE — Patient Instructions (Addendum)
DUE TO COVID-19 ONLY ONE VISITOR IS ALLOWED TO COME WITH YOU AND STAY IN THE WAITING ROOM ONLY DURING PRE OP AND PROCEDURE DAY OF SURGERY. THE 1 VISITOR  MAY VISIT WITH YOU AFTER SURGERY IN YOUR PRIVATE ROOM DURING VISITING HOURS ONLY!                Ana Padilla    Your procedure is scheduled on: 05/21/20   Report to [facility] Main  Entrance   Report to admitting at: 11:15 AM     Call this number if you have problems the morning of surgery [phone]    Remember: NO SOLID FOOD AFTER MIDNIGHT THE NIGHT PRIOR TO SURGERY. NOTHING BY MOUTH EXCEPT CLEAR LIQUIDS UNTIL: 10:15 AM . PLEASE FINISH ENSURE DRINK PER SURGEON ORDER  WHICH NEEDS TO BE COMPLETED AT: 10:15 AM .  CLEAR LIQUID DIET  Foods Allowed                                                                     Foods Excluded  Coffee and tea, regular and decaf                             liquids that you cannot  Plain Jell-O any favor except red or purple                                           see through such as: Fruit ices (not with fruit pulp)                                     milk, soups, orange juice  Iced Popsicles                                    All solid food Carbonated beverages, regular and diet                                    Cranberry, grape and apple juices Sports drinks like Gatorade Lightly seasoned clear broth or consume(fat free) Sugar, honey syrup  Sample Menu Breakfast                                Lunch                                     Supper Cranberry juice                    Beef broth                            Chicken broth Jell-O  Grape juice                           Apple juice Coffee or tea                        Jell-O                                      Popsicle                                                Coffee or tea                        Coffee or  tea  _____________________________________________________________________  BRUSH YOUR TEETH MORNING OF SURGERY AND RINSE YOUR MOUTH OUT, NO CHEWING GUM CANDY OR MINTS.    Take these medicines the morning of surgery with A SIP OF WATER: cetirizine,cabergolin.                               You may not have any metal on your body including hair pins and              piercings  Do not wear jewelry, make-up, lotions, powders or perfumes, deodorant             Do not wear nail polish on your fingernails.  Do not shave  48 hours prior to surgery.    Do not bring valuables to the hospital. [place].  Contacts, dentures or bridgework may not be worn into surgery.  Leave suitcase in the car. After surgery it may be brought to your room.     Patients discharged the day of surgery will not be allowed to drive home. IF YOU ARE HAVING SURGERY AND GOING HOME THE SAME DAY, YOU MUST HAVE AN ADULT TO DRIVE YOU HOME AND BE WITH YOU FOR 24 HOURS. YOU MAY GO HOME BY TAXI OR UBER OR ORTHERWISE, BUT AN ADULT MUST ACCOMPANY YOU HOME AND STAY WITH YOU FOR 24 HOURS.  Name and phone number of your driver:  Special Instructions: N/A              Please read over the following fact sheets you were given: _____________________________________________________________________         [facility] - Preparing for Surgery Before surgery, you can play an important role.  Because skin is not sterile, your skin needs to be as free of germs as possible.  You can reduce the number of germs on your skin by washing with CHG (chlorahexidine gluconate) soap before surgery.  CHG is an antiseptic cleaner which kills germs and bonds with the skin to continue killing germs even after washing. Please DO NOT use if you have an allergy to CHG or antibacterial soaps.  If your skin becomes reddened/irritated stop using the CHG and inform your nurse when you arrive at Short Stay. Do not  shave (including legs and underarms) for at least 48 hours prior to the first CHG shower.  You may shave your face/neck. Please follow these instructions carefully:  1.  Shower with CHG Soap the night before surgery and the  morning of Surgery.  2.  If you choose to wash your hair, wash your hair first as usual with your  normal  shampoo.  3.  After you shampoo, rinse your hair and body thoroughly to remove the  shampoo.                           4.  Use CHG as you would any other liquid soap.  You can apply chg directly  to the skin and wash                       Gently with a scrungie or clean washcloth.  5.  Apply the CHG Soap to your body ONLY FROM THE NECK DOWN.   Do not use on face/ open                           Wound or open sores. Avoid contact with eyes, ears mouth and genitals (private parts).                       Wash face,  Genitals (private parts) with your normal soap.             6.  Wash thoroughly, paying special attention to the area where your surgery  will be performed.  7.  Thoroughly rinse your body with warm water from the neck down.  8.  DO NOT shower/wash with your normal soap after using and rinsing off  the CHG Soap.                9.  Pat yourself dry with a clean towel.            10.  Wear clean pajamas.            11.  Place clean sheets on your bed the night of your first shower and do not  sleep with pets. Day of Surgery : Do not apply any lotions/deodorants the morning of surgery.  Please wear clean clothes to the hospital/surgery center.  FAILURE TO FOLLOW THESE INSTRUCTIONS MAY RESULT IN THE CANCELLATION OF YOUR SURGERY PATIENT SIGNATURE_________________________________  NURSE SIGNATURE__________________________________  ________________________________________________________________________   Ana Padilla  An incentive spirometer is a tool that can help keep your lungs clear and active. This tool measures how well you are filling your lungs  with each breath. Taking long deep breaths may help reverse or decrease the chance of developing breathing (pulmonary) problems (especially infection) following:  A long period of time when you are unable to move or be active. BEFORE THE PROCEDURE   If the spirometer includes an indicator to show your best effort, your nurse or respiratory therapist will set it to a desired goal.  If possible, sit up straight or lean slightly forward. Try not to slouch.  Hold the incentive spirometer in an upright position. INSTRUCTIONS FOR USE  1. Sit on the edge of your bed if possible, or sit up as far as you can in bed or on a chair. 2. Hold the incentive spirometer in an upright position. 3. Breathe out normally. 4. Place the mouthpiece in your mouth and seal your lips tightly around it. 5. Breathe in slowly and as deeply as possible, raising  the piston or the ball toward the top of the column. 6. Hold your breath for 3-5 seconds or for as long as possible. Allow the piston or ball to fall to the bottom of the column. 7. Remove the mouthpiece from your mouth and breathe out normally. 8. Rest for a few seconds and repeat Steps 1 through 7 at least 10 times every 1-2 hours when you are awake. Take your time and take a few normal breaths between deep breaths. 9. The spirometer may include an indicator to show your best effort. Use the indicator as a goal to work toward during each repetition. 10. After each set of 10 deep breaths, practice coughing to be sure your lungs are clear. If you have an incision (the cut made at the time of surgery), support your incision when coughing by placing a pillow or rolled up towels firmly against it. Once you are able to get out of bed, walk around indoors and cough well. You may stop using the incentive spirometer when instructed by your caregiver.  RISKS AND COMPLICATIONS  Take your time so you do not get dizzy or light-headed.  If you are in pain, you may need to  take or ask for pain medication before doing incentive spirometry. It is harder to take a deep breath if you are having pain. AFTER USE  Rest and breathe slowly and easily.  It can be helpful to keep track of a log of your progress. Your caregiver can provide you with a simple table to help with this. If you are using the spirometer at home, follow these instructions: [place] IF:   You are having difficultly using the spirometer.  You have trouble using the spirometer as often as instructed.  Your pain medication is not giving enough relief while using the spirometer.  You develop fever of 100.5 F (38.1 C) or higher. SEEK IMMEDIATE MEDICAL CARE IF:   You cough up bloody sputum that had not been present before.  You develop fever of 102 F (38.9 C) or greater.  You develop worsening pain at or near the incision site. MAKE SURE YOU:   Understand these instructions.  Will watch your condition.  Will get help right away if you are not doing well or get worse. Document Released: 05/03/2006 Document Revised: 03/15/2011 Document Reviewed: 07/04/2006 [facility] Patient Information 2014 [place], [place].   ________________________________________________________________________

## 2020-05-16 ENCOUNTER — Encounter (HOSPITAL_COMMUNITY)
Admission: RE | Admit: 2020-05-16 | Discharge: 2020-05-16 | Disposition: A | Discharge status: Home / Self Care | Payer: 59 | Source: Ambulatory Visit | Attending: Podiatry | Admitting: Podiatry

## 2020-05-16 ENCOUNTER — Encounter: Payer: Self-pay | Admitting: Family Medicine

## 2020-05-16 ENCOUNTER — Ambulatory Visit: Payer: 59 | Admitting: Podiatry

## 2020-05-16 ENCOUNTER — Other Ambulatory Visit: Payer: Self-pay

## 2020-05-16 ENCOUNTER — Ambulatory Visit: Payer: 59 | Admitting: Family Medicine

## 2020-05-16 ENCOUNTER — Encounter (HOSPITAL_COMMUNITY): Payer: Self-pay

## 2020-05-16 VITALS — BP 130/86 | HR 85 | Temp 98.3°F | Ht 67.0 in | Wt 364.0 lb

## 2020-05-16 DIAGNOSIS — Z01818 Encounter for other preprocedural examination: Secondary | ICD-10-CM | POA: Diagnosis not present

## 2020-05-16 DIAGNOSIS — Z01812 Encounter for preprocedural laboratory examination: Secondary | ICD-10-CM | POA: Insufficient documentation

## 2020-05-16 DIAGNOSIS — F439 Reaction to severe stress, unspecified: Secondary | ICD-10-CM | POA: Diagnosis not present

## 2020-05-16 LAB — CBC
HCT: 34.8 % — ABNORMAL LOW (ref 36.0–46.0)
Hemoglobin: 10.5 g/dL — ABNORMAL LOW (ref 12.0–15.0)
MCH: 21.5 pg — ABNORMAL LOW (ref 26.0–34.0)
MCHC: 30.2 g/dL (ref 30.0–36.0)
MCV: 71.2 fL — ABNORMAL LOW (ref 80.0–100.0)
Platelets: 402 10*3/uL — ABNORMAL HIGH (ref 150–400)
RBC: 4.89 MIL/uL (ref 3.87–5.11)
RDW: 16.3 % — ABNORMAL HIGH (ref 11.5–15.5)
WBC: 5.8 10*3/uL (ref 4.0–10.5)
nRBC: 0 % (ref 0.0–0.2)

## 2020-05-16 MED ORDER — CYCLOBENZAPRINE HCL 5 MG PO TABS: 5.0000 mg | ORAL_TABLET | Freq: Every evening | ORAL | 0 refills | Status: DC | PRN

## 2020-05-16 NOTE — H&P (View-Only) (Signed)
Subjective:    Patient ID: Ana Padilla, female    DOB: Oct 12, 1985, 35 y.o.   MRN: [id number]  Chief Complaint  Patient presents with  . Surgical clearance     HPI Patient was seen today for follow-up and preop evaluation.  Patient scheduled to have debridement of left foot 05/21/2020 by Dr. Samuella Cota at [facility].  Patient having continued pain in left foot.  Patient denies current issues with ADLs.  Given ongoing foot issues patient finds it difficult to exercise.  Eating out less times per week, but notes weight gain since last office visit.  Patient with increased family stress.  Pt has been taking care of two children.  One of the kid's biological parents are dealing with substance issues.  Pt notes increased anxiety.  Typically worse at night, causing L sided upper chest and neck tightness.  Pt notes the muscles in her neck become tense and she is trying to lie down and relax for bed.  Pt tried Zoloft 20 mg, however stopped medication after a few days as it made her feel "cloudy".  Kidney disease? No   Prior surgeries/Issues following anesthesia? no Hx MI, heart arrythmia, CHF, angina or stroke? None Epilepsy or Seizures? None  Arthritis or problems with neck or jaw? None   Thyroid disease? None  Liver disease? None  Asthma, COPD or chronic lung disease? None  Diabetes? None -h/o pituitary adenoma.  Other: Poor nutrition, Frail or other: no   Past Medical History:  Diagnosis Date  . 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 Anemia   . Elevated ferritin level   . Heel spur, left   . IBS (irritable bowel syndrome)   . Low iron   . Morbid obesity (HCC)   . Seasonal allergies   . Sickle cell trait (HCC)   . Vitamin D deficiency     Allergies  Allergen Reactions  . Food Other (See Comments)    Seasonal Allergies & fresh fruit---itchy mouth&nose   . Pollinex-T [Modified Tree Tyrosine Adsorbate]     Itchy nose and eyes, ears, runny nose,    Family History   Adopted: Yes  Problem Relation Age of Onset  . Sickle cell trait Mother   . Heart disease Father   . Obesity Father     METS: ?Can take care of self, such as eat, dress, or use the toilet (1 MET). yes ?Can walk up a flight of steps or a hill (4 METs).yes ?Can do heavy work around the house such as scrubbing floors or lifting or moving heavy furniture (between 4 and 10 METs). yes ?Can participate in strenuous sports such as swimming, singles tennis, football, basketball, and skiing (>10 METs) . AHA Risks: Major predictors that require intensive management and may lead to delay in or cancellation of the operative procedure unless emergent: NONE  . Unstable coronary syndromes including unstable or severe angina or recent MI  . Decompensated heart failure including NYHA functional class IV or worsening or new-onset HF  . Significant arrhythmias including high grade AV block, symptomatic ventricular arrhythmias, supraventricular arrhythmias with ventricular rate >100 bpm at rest, symptomatic bradycardia, and newly recognized ventricular tachycardia  . Severe heart valve disease including severe aortic stenosis or symptomatic mitral stenosis   Other clinical predictors that warrant careful assessment of current status: NONE  . History of ischemic heart disease . History of cerebrovascular disease  . History of compensated heart failure or prior heart failure  . Diabetes mellitus  .  Renal insufficiency  Type of surgery and Risk: 1) High risk (reported risk of cardiac death or nonfatal myocardial infarction [MI] often greater than 5 percent):  Marland Kitchen Aortic and other major vascular surgery  . Peripheral artery surgery   2)Intermediate risk (reported risk of cardiac death or nonfatal MI generally 1 to 5 percent):  Marland Kitchen Carotid endarterectomy  . Head and neck surgery  . Intraperitoneal and intrathoracic surgery  . Orthopedic surgery  . Prostate surgery   3)Low risk (reported risk of cardiac  death or nonfatal MI generally less than 1 percent):  Marland Kitchen Ambulatory surgery  . Endoscopic procedures  . Superficial procedure  . Cataract surgery  . Breast surgery  ROS General: Denies fever, chills, night sweats, changes in appetite + weight gain HEENT: Denies headaches, ear pain, changes in vision, rhinorrhea, sore throat CV: Denies CP, palpitations, SOB, orthopnea Pulm: Denies SOB, cough, wheezing GI: Denies abdominal pain, nausea, vomiting, diarrhea, constipation GU: Denies dysuria, hematuria, frequency, vaginal discharge Msk: Denies muscle cramps, joint pains  +L foot pain Neuro: Denies weakness, numbness, tingling Skin: Denies rashes, bruising Psych: Denies depression, anxiety, hallucinations  +anxiety, stress    Objective:    Blood pressure 130/86, pulse 85, temperature 98.3 F (36.8 C), temperature source Oral, height 5\' 7"  (1.702 m), weight (!) 364 lb (165.1 kg), SpO2 97 %.  Gen. Pleasant, well-nourished, obese, in no distress, normal affect   HEENT: [place]/AT, face symmetric, conjunctiva clear, no scleral icterus, PERRLA, EOMI, nares patent without drainage Neck: No JVD, no thyromegaly, no carotid bruits Lungs: no accessory muscle use, CTAB, no wheezes or rales Cardiovascular: RRR, no m/r/g, no peripheral edema Abdomen: BS present, soft, NT/ND Musculoskeletal: No deformities, no cyanosis or clubbing, normal tone Neuro:  A&Ox3, CN II-XII intact, normal gait Skin:  Warm, no lesions/ rash  Wt Readings from Last 3 Encounters:  05/16/20 (!) 364 lb (165.1 kg)  03/19/20 (!) 360 lb 9.6 oz (163.6 kg)  03/13/20 (!) 359 lb (162.8 kg)    Lab Results  Component Value Date   WBC 6.4 03/19/2020   HGB 10.8 (L) 03/19/2020   HCT 32.7 (L) 03/19/2020   PLT 412.0 (H) 03/19/2020   GLUCOSE 92 08/09/2019   CHOL 176 08/09/2019   TRIG 115 08/09/2019   HDL 72 08/09/2019   LDLCALC 83 08/09/2019   ALT 11 08/09/2019   AST 15 08/09/2019   NA 138 08/09/2019   K 4.3 08/09/2019   CL 102  08/09/2019   CREATININE 0.75 08/09/2019   BUN 12 08/09/2019   CO2 30 08/09/2019   TSH 1.24 03/19/2020   INR 0.92 08/04/2017   HGBA1C 5.1 08/09/2019    Assessment/Plan:  Preop examination -Risk factors: obesity -Surgery Risks:low -functional capacity: > 4 METs without symptoms -comorbidities: obesity Patient Specific Risks: patient is low risk for low risks surgery  Recommendations for optimizing general medical care prior to surgery: -advised patient to discuss specific risks morbidity and mortality of surgery with surgeon, CV risks discussed with patient -advised patient will defer to surgeon for post-op DVT prophylaxis and post op care -no specific medical recommendations for this patient at this time and no recommendations to defer surgery or for further CV testing prior to surgery -form for pre-op optimization of general medical care prior to surgery faxed to surgeon office   Stress  -Discussed the importance of self-care -will d/c Zoloft 20 mg -Consider counseling -Discussed stretching, heat, and other relaxation techniques. - Plan: cyclobenzaprine (FLEXERIL) 5 MG tablet  F/u as needed  Abbe Amsterdam, MD

## 2020-05-16 NOTE — Progress Notes (Signed)
Subjective:    Patient ID: Ana Padilla, female    DOB: Oct 12, 1985, 35 y.o.   MRN: [id number]  Chief Complaint  Patient presents with  . Surgical clearance     HPI Patient was seen today for follow-up and preop evaluation.  Patient scheduled to have debridement of left foot 05/21/2020 by Dr. Samuella Cota at [facility].  Patient having continued pain in left foot.  Patient denies current issues with ADLs.  Given ongoing foot issues patient finds it difficult to exercise.  Eating out less times per week, but notes weight gain since last office visit.  Patient with increased family stress.  Pt has been taking care of two children.  One of the kid's biological parents are dealing with substance issues.  Pt notes increased anxiety.  Typically worse at night, causing L sided upper chest and neck tightness.  Pt notes the muscles in her neck become tense and she is trying to lie down and relax for bed.  Pt tried Zoloft 20 mg, however stopped medication after a few days as it made her feel "cloudy".  Kidney disease? No   Prior surgeries/Issues following anesthesia? no Hx MI, heart arrythmia, CHF, angina or stroke? None Epilepsy or Seizures? None  Arthritis or problems with neck or jaw? None   Thyroid disease? None  Liver disease? None  Asthma, COPD or chronic lung disease? None  Diabetes? None -h/o pituitary adenoma.  Other: Poor nutrition, Frail or other: no   Past Medical History:  Diagnosis Date  . 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 Anemia   . Elevated ferritin level   . Heel spur, left   . IBS (irritable bowel syndrome)   . Low iron   . Morbid obesity (HCC)   . Seasonal allergies   . Sickle cell trait (HCC)   . Vitamin D deficiency     Allergies  Allergen Reactions  . Food Other (See Comments)    Seasonal Allergies & fresh fruit---itchy mouth&nose   . Pollinex-T [Modified Tree Tyrosine Adsorbate]     Itchy nose and eyes, ears, runny nose,    Family History   Adopted: Yes  Problem Relation Age of Onset  . Sickle cell trait Mother   . Heart disease Father   . Obesity Father     METS: ?Can take care of self, such as eat, dress, or use the toilet (1 MET). yes ?Can walk up a flight of steps or a hill (4 METs).yes ?Can do heavy work around the house such as scrubbing floors or lifting or moving heavy furniture (between 4 and 10 METs). yes ?Can participate in strenuous sports such as swimming, singles tennis, football, basketball, and skiing (>10 METs) . AHA Risks: Major predictors that require intensive management and may lead to delay in or cancellation of the operative procedure unless emergent: NONE  . Unstable coronary syndromes including unstable or severe angina or recent MI  . Decompensated heart failure including NYHA functional class IV or worsening or new-onset HF  . Significant arrhythmias including high grade AV block, symptomatic ventricular arrhythmias, supraventricular arrhythmias with ventricular rate >100 bpm at rest, symptomatic bradycardia, and newly recognized ventricular tachycardia  . Severe heart valve disease including severe aortic stenosis or symptomatic mitral stenosis   Other clinical predictors that warrant careful assessment of current status: NONE  . History of ischemic heart disease . History of cerebrovascular disease  . History of compensated heart failure or prior heart failure  . Diabetes mellitus  .  Renal insufficiency  Type of surgery and Risk: 1) High risk (reported risk of cardiac death or nonfatal myocardial infarction [MI] often greater than 5 percent):  Marland Kitchen Aortic and other major vascular surgery  . Peripheral artery surgery   2)Intermediate risk (reported risk of cardiac death or nonfatal MI generally 1 to 5 percent):  Marland Kitchen Carotid endarterectomy  . Head and neck surgery  . Intraperitoneal and intrathoracic surgery  . Orthopedic surgery  . Prostate surgery   3)Low risk (reported risk of cardiac  death or nonfatal MI generally less than 1 percent):  Marland Kitchen Ambulatory surgery  . Endoscopic procedures  . Superficial procedure  . Cataract surgery  . Breast surgery  ROS General: Denies fever, chills, night sweats, changes in appetite + weight gain HEENT: Denies headaches, ear pain, changes in vision, rhinorrhea, sore throat CV: Denies CP, palpitations, SOB, orthopnea Pulm: Denies SOB, cough, wheezing GI: Denies abdominal pain, nausea, vomiting, diarrhea, constipation GU: Denies dysuria, hematuria, frequency, vaginal discharge Msk: Denies muscle cramps, joint pains  +L foot pain Neuro: Denies weakness, numbness, tingling Skin: Denies rashes, bruising Psych: Denies depression, anxiety, hallucinations  +anxiety, stress    Objective:    Blood pressure 130/86, pulse 85, temperature 98.3 F (36.8 C), temperature source Oral, height 5\' 7"  (1.702 m), weight (!) 364 lb (165.1 kg), SpO2 97 %.  Gen. Pleasant, well-nourished, obese, in no distress, normal affect   HEENT: [place]/AT, face symmetric, conjunctiva clear, no scleral icterus, PERRLA, EOMI, nares patent without drainage Neck: No JVD, no thyromegaly, no carotid bruits Lungs: no accessory muscle use, CTAB, no wheezes or rales Cardiovascular: RRR, no m/r/g, no peripheral edema Abdomen: BS present, soft, NT/ND Musculoskeletal: No deformities, no cyanosis or clubbing, normal tone Neuro:  A&Ox3, CN II-XII intact, normal gait Skin:  Warm, no lesions/ rash  Wt Readings from Last 3 Encounters:  05/16/20 (!) 364 lb (165.1 kg)  03/19/20 (!) 360 lb 9.6 oz (163.6 kg)  03/13/20 (!) 359 lb (162.8 kg)    Lab Results  Component Value Date   WBC 6.4 03/19/2020   HGB 10.8 (L) 03/19/2020   HCT 32.7 (L) 03/19/2020   PLT 412.0 (H) 03/19/2020   GLUCOSE 92 08/09/2019   CHOL 176 08/09/2019   TRIG 115 08/09/2019   HDL 72 08/09/2019   LDLCALC 83 08/09/2019   ALT 11 08/09/2019   AST 15 08/09/2019   NA 138 08/09/2019   K 4.3 08/09/2019   CL 102  08/09/2019   CREATININE 0.75 08/09/2019   BUN 12 08/09/2019   CO2 30 08/09/2019   TSH 1.24 03/19/2020   INR 0.92 08/04/2017   HGBA1C 5.1 08/09/2019    Assessment/Plan:  Preop examination -Risk factors: obesity -Surgery Risks:low -functional capacity: > 4 METs without symptoms -comorbidities: obesity Patient Specific Risks: patient is low risk for low risks surgery  Recommendations for optimizing general medical care prior to surgery: -advised patient to discuss specific risks morbidity and mortality of surgery with surgeon, CV risks discussed with patient -advised patient will defer to surgeon for post-op DVT prophylaxis and post op care -no specific medical recommendations for this patient at this time and no recommendations to defer surgery or for further CV testing prior to surgery -form for pre-op optimization of general medical care prior to surgery faxed to surgeon office   Stress  -Discussed the importance of self-care -will d/c Zoloft 20 mg -Consider counseling -Discussed stretching, heat, and other relaxation techniques. - Plan: cyclobenzaprine (FLEXERIL) 5 MG tablet  F/u as needed  Abbe Amsterdam, MD

## 2020-05-16 NOTE — Progress Notes (Signed)
COVID Vaccine Completed: Yes Date COVID Vaccine completed: 11/10/19 COVID vaccine manufacturer: Pfizer      PCP -Dr. Grier Mitts. LOV: 03/19/20  Cardiologist -   Chest x-ray -  EKG -  Stress Test -  ECHO -  Cardiac Cath -  Pacemaker/ICD device last checked:  Sleep Study -  CPAP -   Fasting Blood Sugar -  Checks Blood Sugar _____ times a day  Blood Thinner Instructions: Aspirin Instructions: Last Dose:  Anesthesia review:   Patient denies shortness of breath, fever, cough and chest pain at PAT appointment   Patient verbalized understanding of instructions that were given to them at the PAT appointment. Patient was also instructed that they will need to review over the PAT instructions again at home before surgery.

## 2020-05-19 NOTE — Progress Notes (Signed)
Preop H and P done by DR Grier Mitts- 05/16/20- in epic.

## 2020-05-21 ENCOUNTER — Ambulatory Visit (HOSPITAL_COMMUNITY): Payer: 59 | Admitting: Emergency Medicine

## 2020-05-21 ENCOUNTER — Ambulatory Visit (HOSPITAL_COMMUNITY): Payer: 59 | Admitting: Certified Registered"

## 2020-05-21 ENCOUNTER — Encounter (HOSPITAL_COMMUNITY)
Admission: RE | Disposition: A | Discharge status: Home / Self Care | Payer: Self-pay | Source: Ambulatory Visit | Attending: Podiatry

## 2020-05-21 ENCOUNTER — Encounter (HOSPITAL_COMMUNITY): Payer: Self-pay | Admitting: Podiatry

## 2020-05-21 ENCOUNTER — Ambulatory Visit (HOSPITAL_COMMUNITY)
Admission: RE | Admit: 2020-05-21 | Discharge: 2020-05-21 | Disposition: A | Discharge status: Home / Self Care | Payer: 59 | Source: Ambulatory Visit | Attending: Podiatry | Admitting: Podiatry

## 2020-05-21 DIAGNOSIS — Z91018 Allergy to other foods: Secondary | ICD-10-CM | POA: Diagnosis not present

## 2020-05-21 DIAGNOSIS — M7660 Achilles tendinitis, unspecified leg: Secondary | ICD-10-CM | POA: Diagnosis not present

## 2020-05-21 DIAGNOSIS — M7662 Achilles tendinitis, left leg: Secondary | ICD-10-CM

## 2020-05-21 HISTORY — PX: TENDON REPAIR: SHX5111

## 2020-05-21 LAB — PREGNANCY, URINE: Preg Test, Ur: NEGATIVE

## 2020-05-21 SURGERY — TENDON REPAIR
Anesthesia: Monitor Anesthesia Care | Site: Foot | Laterality: Left

## 2020-05-21 MED ORDER — FENTANYL CITRATE (PF) 100 MCG/2ML IJ SOLN
INTRAMUSCULAR | Status: DC | PRN
  Administered 2020-05-21 (×2): 50 ug via INTRAVENOUS

## 2020-05-21 MED ORDER — PROPOFOL 500 MG/50ML IV EMUL
INTRAVENOUS | Status: AC
  Filled 2020-05-21: qty 50

## 2020-05-21 MED ORDER — ONDANSETRON HCL 4 MG/2ML IJ SOLN
INTRAMUSCULAR | Status: AC
  Administered 2020-05-21: 4 mg via INTRAVENOUS
  Filled 2020-05-21: qty 2

## 2020-05-21 MED ORDER — MIDAZOLAM HCL 2 MG/2ML IJ SOLN
INTRAMUSCULAR | Status: DC | PRN
  Administered 2020-05-21: 2 mg via INTRAVENOUS

## 2020-05-21 MED ORDER — OXYCODONE HCL 5 MG PO TABS: 5.0000 mg | ORAL_TABLET | ORAL | 0 refills | Status: DC | PRN

## 2020-05-21 MED ORDER — CEFAZOLIN SODIUM-DEXTROSE 2-4 GM/100ML-% IV SOLN
INTRAVENOUS | Status: AC
  Filled 2020-05-21: qty 100

## 2020-05-21 MED ORDER — PROPOFOL 500 MG/50ML IV EMUL
INTRAVENOUS | Status: DC | PRN
  Administered 2020-05-21: 130 ug/kg/min via INTRAVENOUS

## 2020-05-21 MED ORDER — FENTANYL CITRATE (PF) 100 MCG/2ML IJ SOLN
INTRAMUSCULAR | Status: AC
  Administered 2020-05-21: 50 ug via INTRAVENOUS
  Filled 2020-05-21: qty 2

## 2020-05-21 MED ORDER — ONDANSETRON HCL 4 MG/2ML IJ SOLN: 4.0000 mg | Freq: Four times a day (QID) | INTRAMUSCULAR | Status: AC | PRN

## 2020-05-21 MED ORDER — CEPHALEXIN 500 MG PO CAPS: ORAL_CAPSULE | ORAL | 0 refills | Status: DC

## 2020-05-21 MED ORDER — LACTATED RINGERS IV SOLN: INTRAVENOUS | Status: DC

## 2020-05-21 MED ORDER — CEFAZOLIN IN SODIUM CHLORIDE 3-0.9 GM/100ML-% IV SOLN
3.0000 g | INTRAVENOUS | Status: AC
Start: 2020-05-21 — End: 2020-05-21
  Administered 2020-05-21: 3 g via INTRAVENOUS
  Filled 2020-05-21: qty 100

## 2020-05-21 MED ORDER — ONDANSETRON HCL 4 MG PO TABS: 4.0000 mg | ORAL_TABLET | Freq: Three times a day (TID) | ORAL | 0 refills | Status: DC | PRN

## 2020-05-21 MED ORDER — OXYCODONE HCL 5 MG PO TABS
ORAL_TABLET | ORAL | Status: AC
  Administered 2020-05-21: 5 mg via ORAL
  Filled 2020-05-21: qty 1

## 2020-05-21 MED ORDER — ONDANSETRON HCL 4 MG/2ML IJ SOLN
INTRAMUSCULAR | Status: DC | PRN
  Administered 2020-05-21: 4 mg via INTRAVENOUS

## 2020-05-21 MED ORDER — FENTANYL CITRATE (PF) 100 MCG/2ML IJ SOLN
25.0000 ug | INTRAMUSCULAR | Status: DC | PRN
  Administered 2020-05-21: 50 ug via INTRAVENOUS

## 2020-05-21 MED ORDER — MIDAZOLAM HCL 2 MG/2ML IJ SOLN
INTRAMUSCULAR | Status: AC
  Filled 2020-05-21: qty 2

## 2020-05-21 MED ORDER — BUPIVACAINE HCL (PF) 0.25 % IJ SOLN
INTRAMUSCULAR | Status: AC
  Filled 2020-05-21: qty 30

## 2020-05-21 MED ORDER — FENTANYL CITRATE (PF) 100 MCG/2ML IJ SOLN
INTRAMUSCULAR | Status: AC
  Filled 2020-05-21: qty 2

## 2020-05-21 MED ORDER — OXYCODONE HCL 5 MG/5ML PO SOLN: 5.0000 mg | Freq: Once | ORAL | Status: AC | PRN

## 2020-05-21 MED ORDER — METHYLPREDNISOLONE ACETATE 40 MG/ML IJ SUSP
INTRAMUSCULAR | Status: AC
  Filled 2020-05-21: qty 2

## 2020-05-21 MED ORDER — ORAL CARE MOUTH RINSE
15.0000 mL | Freq: Once | OROMUCOSAL | Status: AC
  Administered 2020-05-21: 15 mL via OROMUCOSAL

## 2020-05-21 MED ORDER — ONDANSETRON HCL 4 MG/2ML IJ SOLN
INTRAMUSCULAR | Status: AC
  Filled 2020-05-21: qty 2

## 2020-05-21 MED ORDER — CHLORHEXIDINE GLUCONATE 0.12 % MT SOLN: 15.0000 mL | Freq: Once | OROMUCOSAL | Status: AC

## 2020-05-21 MED ORDER — LIDOCAINE HCL (PF) 1 % IJ SOLN
INTRAMUSCULAR | Status: AC
  Filled 2020-05-21: qty 30

## 2020-05-21 MED ORDER — PROPOFOL 10 MG/ML IV BOLUS
INTRAVENOUS | Status: AC
  Filled 2020-05-21: qty 20

## 2020-05-21 MED ORDER — LIDOCAINE HCL (PF) 1 % IJ SOLN
INTRAMUSCULAR | Status: DC | PRN
  Administered 2020-05-21: 10 mL

## 2020-05-21 MED ORDER — OXYCODONE HCL 5 MG PO TABS
5.0000 mg | ORAL_TABLET | Freq: Once | ORAL | Status: AC | PRN
Start: 2020-05-21 — End: 2020-05-21

## 2020-05-21 MED ORDER — PROPOFOL 10 MG/ML IV BOLUS
INTRAVENOUS | Status: DC | PRN
  Administered 2020-05-21 (×2): 20 mg via INTRAVENOUS

## 2020-05-21 SURGICAL SUPPLY — 50 items
APPLICATOR COTTON TIP 6 STRL (MISCELLANEOUS) IMPLANT
APPLICATOR COTTON TIP 6IN STRL (MISCELLANEOUS)
BANDAGE ESMARK 6X9 LF (GAUZE/BANDAGES/DRESSINGS) IMPLANT
BENZOIN TINCTURE PRP APPL 2/3 (GAUZE/BANDAGES/DRESSINGS) IMPLANT
BLADE CLIPPER SURG (BLADE) IMPLANT
BLADE SURG 15 STRL LF DISP TIS (BLADE) IMPLANT
BLADE SURG 15 STRL SS (BLADE)
BNDG ELASTIC 4X5.8 VLCR STR LF (GAUZE/BANDAGES/DRESSINGS) ×2 IMPLANT
BNDG ESMARK 4X9 LF (GAUZE/BANDAGES/DRESSINGS) IMPLANT
BNDG ESMARK 6X9 LF (GAUZE/BANDAGES/DRESSINGS)
BNDG GAUZE ELAST 4 BULKY (GAUZE/BANDAGES/DRESSINGS) IMPLANT
CHLORAPREP W/TINT 26 (MISCELLANEOUS) ×2 IMPLANT
COVER SURGICAL LIGHT HANDLE (MISCELLANEOUS) ×2 IMPLANT
COVER WAND RF STERILE (DRAPES) IMPLANT
DECANTER SPIKE VIAL GLASS SM (MISCELLANEOUS) IMPLANT
DRAPE U-SHAPE 47X51 STRL (DRAPES) ×2 IMPLANT
DRSG EMULSION OIL 3X3 NADH (GAUZE/BANDAGES/DRESSINGS) IMPLANT
DRSG OPSITE POSTOP 4X6 (GAUZE/BANDAGES/DRESSINGS) ×2 IMPLANT
DRSG PAD ABDOMINAL 8X10 ST (GAUZE/BANDAGES/DRESSINGS) IMPLANT
ELECT REM PT RETURN 15FT ADLT (MISCELLANEOUS) IMPLANT
GAUZE SPONGE 4X4 12PLY STRL (GAUZE/BANDAGES/DRESSINGS) IMPLANT
GLOVE SRG 8 PF TXTR STRL LF DI (GLOVE) ×1 IMPLANT
GLOVE SURG LTX SZ7.5 (GLOVE) ×2 IMPLANT
GLOVE SURG LTX SZ8 (GLOVE) IMPLANT
GLOVE SURG UNDER POLY LF SZ8 (GLOVE) ×1
GOWN STRL REUS W/ TWL LRG LVL3 (GOWN DISPOSABLE) ×1 IMPLANT
GOWN STRL REUS W/ TWL XL LVL3 (GOWN DISPOSABLE) ×1 IMPLANT
GOWN STRL REUS W/TWL LRG LVL3 (GOWN DISPOSABLE) ×1
GOWN STRL REUS W/TWL XL LVL3 (GOWN DISPOSABLE) ×1
KIT BASIN OR (CUSTOM PROCEDURE TRAY) ×2 IMPLANT
KIT TURNOVER KIT A (KITS) ×2 IMPLANT
NEEDLE HYPO 22GX1.5 SAFETY (NEEDLE) ×2 IMPLANT
NS IRRIG 1000ML POUR BTL (IV SOLUTION) ×2 IMPLANT
PACK ORTHO EXTREMITY (CUSTOM PROCEDURE TRAY) ×2 IMPLANT
PAD ARMBOARD 7.5X6 YLW CONV (MISCELLANEOUS) ×2 IMPLANT
PAD CAST 4YDX4 CTTN HI CHSV (CAST SUPPLIES) IMPLANT
PAD MASON LEG HOLDER (PIN) IMPLANT
PADDING CAST COTTON 4X4 STRL (CAST SUPPLIES)
PENCIL SMOKE EVACUATOR (MISCELLANEOUS) IMPLANT
SPONGE LAP 4X18 RFD (DISPOSABLE) ×2 IMPLANT
STRIP CLOSURE SKIN 1/2X4 (GAUZE/BANDAGES/DRESSINGS) IMPLANT
SUT ETHILON 4 0 PS 2 18 (SUTURE) IMPLANT
SUT MERSILENE 4 0 P 3 (SUTURE) IMPLANT
SUT VIC AB 3-0 FS2 27 (SUTURE) IMPLANT
SUT VIC AB 3-0 PS2 18 (SUTURE)
SUT VIC AB 3-0 PS2 18XBRD (SUTURE) IMPLANT
SYR CONTROL 10ML LL (SYRINGE) ×2 IMPLANT
TOWEL OR 17X26 10 PK STRL BLUE (TOWEL DISPOSABLE) ×2 IMPLANT
WAND TOPAZ MICRO DEBRIDER (MISCELLANEOUS) ×2 IMPLANT
WATER STERILE IRR 1000ML POUR (IV SOLUTION) IMPLANT

## 2020-05-21 NOTE — Interval H&P Note (Signed)
History and Physical Interval Note:  05/21/2020 11:46 AM  Ana Padilla  has presented today for surgery, with the diagnosis of ACHILLES TENDONITIS.  The various methods of treatment have been discussed with the patient and family. After consideration of risks, benefits and other options for treatment, the patient has consented to  Procedure(s): TENOLYSIS OF LEFT FOOT (Left) as a surgical intervention.  The patient's history has been reviewed, patient examined, no change in status, stable for surgery.  I have reviewed the patient's chart and labs.  Questions were answered to the patient's satisfaction.     Evelina Bucy

## 2020-05-21 NOTE — Discharge Instructions (Signed)
After Surgery Instructions   1) If you are recuperating from surgery anywhere other than home, please be sure to leave [place] the number where you can be reached.  2) Go directly home and rest.  3) Keep the operated foot(feet) elevated six inches above the hip when sitting or lying down. This will help control swelling and pain.  4) Support the elevated foot and leg with pillows. DO NOT PLACE PILLOWS UNDER THE KNEE.  5) DO NOT REMOVE or get your bandages WET, unless you were given different instructions by your doctor to do so. This increases the risk of infection.  6) Wear your surgical shoe or surgical boot at all times when you are up on your feet.  7) A limited amount of pain and swelling may occur. The skin may take on a bruised appearance. DO NOT BE ALARMED, THIS IS NORMAL.  8) For slight pain and swelling, apply an ice pack directly over the bandages for 15 minutes only out of each hour of the day. Continue until seen in the office for your first post op visit. DO NOT APPLY ANY FORM OF HEAT TO THE AREA.  9) Have prescriptions filled immediately and take as directed.  10) Drink lots of liquids, water and juice to stay hydrated.  11) CALL IMMEDIATELY IF:  *Bleeding continues until the following day of surgery  *Pain increases and/or does not respond to medication  *Bandages or cast appears to tight  *If your bandage gets wet  *Trip, fall or stump your surgical foot  *If your temperature goes above 101  *If you have ANY questions at all  12) You are expected to be non-weightbearing after your surgery.   If you need to reach the nurse for any reason, please call: [place]: [phone] [place]: [phone] [place]: [phone]   General Anesthesia, Adult, Care After This sheet gives you information about how to care for yourself after your procedure. Your health care provider may also give you more specific instructions. If you have problems or  questions, contact your health care provider. What can I expect after the procedure? After the procedure, the following side effects are common:  Pain or discomfort at the IV site.  Nausea.  Vomiting.  Sore throat.  Trouble concentrating.  Feeling cold or chills.  Feeling weak or tired.  Sleepiness and fatigue.  Soreness and body aches. These side effects can affect parts of the body that were not involved in surgery. Follow these instructions at home: For the time period you were told by your health care provider:  Rest.  Do not participate in activities where you could fall or become injured.  Do not drive or use machinery.  Do not drink alcohol.  Do not take sleeping pills or medicines that cause drowsiness.  Do not make important decisions or sign legal documents.  Do not take care of children on your own.   Eating and drinking  Follow any instructions from your health care provider about eating or drinking restrictions.  When you feel hungry, start by eating small amounts of foods that are soft and easy to digest (bland), such as toast. Gradually return to your regular diet.  Drink enough fluid to keep your urine pale yellow.  If you vomit, rehydrate by drinking water, juice, or clear broth. General instructions  If you have sleep apnea, surgery and certain medicines can increase your risk for breathing problems. Follow instructions from your health care provider about wearing your sleep  device: ? Anytime you are sleeping, including during daytime naps. ? While taking prescription pain medicines, sleeping medicines, or medicines that make you drowsy.  Have a responsible adult stay with you for the time you are told. It is important to have someone help care for you until you are awake and alert.  Return to your normal activities as told by your health care provider. Ask your health care provider what activities are safe for you.  Take over-the-counter and  prescription medicines only as told by your health care provider.  If you smoke, do not smoke without supervision.  Keep all follow-up visits as told by your health care provider. This is important. Contact a health care provider if:  You have nausea or vomiting that does not get better with medicine.  You cannot eat or drink without vomiting.  You have pain that does not get better with medicine.  You are unable to pass urine.  You develop a skin rash.  You have a fever.  You have redness around your IV site that gets worse. Get help right away if:  You have difficulty breathing.  You have chest pain.  You have blood in your urine or stool, or you vomit blood. Summary  After the procedure, it is common to have a sore throat or nausea. It is also common to feel tired.  Have a responsible adult stay with you for the time you are told. It is important to have someone help care for you until you are awake and alert.  When you feel hungry, start by eating small amounts of foods that are soft and easy to digest (bland), such as toast. Gradually return to your regular diet.  Drink enough fluid to keep your urine pale yellow.  Return to your normal activities as told by your health care provider. Ask your health care provider what activities are safe for you. This information is not intended to replace advice given to you by your health care provider. Make sure you discuss any questions you have with your health care provider. Document Revised: 09/06/2019 Document Reviewed: 04/05/2019 Elsevier Patient Education  2021 [employer].

## 2020-05-21 NOTE — Anesthesia Procedure Notes (Signed)
Procedure Name: MAC Date/Time: 05/21/2020 12:06 PM Performed by: Niel Hummer, CRNA Pre-anesthesia Checklist: Emergency Drugs available, Patient identified, Suction available and Patient being monitored Oxygen Delivery Method: Simple face mask

## 2020-05-21 NOTE — Op Note (Signed)
Patient Name: MAYLASIA SHOBE DOB: 04/04/1985  MRN: [id number]   Date of Surgery: 05/21/2020  Surgeon: Dr. Ventura Sellers, DPM Assistants: none  Pre-operative Diagnosis:  * No Diagnosis Codes entered * Post-operative Diagnosis:  * No Diagnosis Codes entered * Procedures:  1) Microdebridement of Achilles tendon Pathology/Specimens: * No specimens in log * Anesthesia: MAC Hemostasis: * No tourniquets in log * Estimated Blood Loss: 1 mL Materials: * No implants in log * Medications: 10 ml lidocaine 1% plain Complications: none  Indications for Procedure:  This is a 35 y.o. 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 She presents for debridement of the tendon to promote healing.   Procedure in Detail: Patient was identified in pre-operative holding area. Formal consent was signed and the left lower extremity was marked. Patient was brought back to the operating room. Anesthesia was induced. The extremity was prepped and draped in the usual sterile fashion. Timeout was taken to confirm patient name, laterality, and procedure prior to incision.   Attention was then directed to the left posterior Achilles tendon. A 4x4 grid was drawn about the thickened area. Each point was punctured with an 18g needle. After this the radiofrequency ablation wand was used to ablate the entire thickness of the Achilles tendon at each point.   The foot was then dressed with Opsite dressing and 4x4. Patient tolerated the procedure well.   Disposition: Following a period of post-operative monitoring, patient will be transferred home.

## 2020-05-21 NOTE — Anesthesia Preprocedure Evaluation (Addendum)
Anesthesia Evaluation  Patient identified by MRN, date of birth, ID band Patient awake    Reviewed: Allergy & Precautions, H&P , NPO status , Patient's Chart, lab work & pertinent test results  Airway Mallampati: II   Neck ROM: full    Dental   Pulmonary neg pulmonary ROS,    breath sounds clear to auscultation       Cardiovascular negative cardio ROS   Rhythm:regular Rate:Normal     Neuro/Psych    GI/Hepatic   Endo/Other  Morbid obesity  Renal/GU      Musculoskeletal   Abdominal   Peds  Hematology  (+) Blood dyscrasia, anemia ,   Anesthesia Other Findings   Reproductive/Obstetrics                             Anesthesia Physical Anesthesia Plan  ASA: II  Anesthesia Plan: MAC   Post-op Pain Management:    Induction: Intravenous  PONV Risk Score and Plan: 2 and Ondansetron, Midazolam, Treatment may vary due to age or medical condition and Propofol infusion  Airway Management Planned: Simple Face Mask  Additional Equipment:   Intra-op Plan:   Post-operative Plan:   Informed Consent: I have reviewed the patients History and Physical, chart, labs and discussed the procedure including the risks, benefits and alternatives for the proposed anesthesia with the patient or authorized representative who has indicated his/her understanding and acceptance.     Dental advisory given  Plan Discussed with: CRNA, Anesthesiologist and Surgeon  Anesthesia Plan Comments:        Anesthesia Quick Evaluation

## 2020-05-21 NOTE — Transfer of Care (Signed)
Immediate Anesthesia Transfer of Care Note  Patient: Ana Padilla  Procedure(s) Performed: TENOLYSIS OF LEFT FOOT (Left )  Patient Location: PACU  Anesthesia Type:MAC  Level of Consciousness: awake, alert  and oriented  Airway & Oxygen Therapy: Patient Spontanous Breathing and Patient connected to face mask oxygen  Post-op Assessment: Report given to RN, Post -op Vital signs reviewed and stable and Patient moving all extremities X 4  Post vital signs: Reviewed and stable  Last Vitals:  Vitals Value Taken Time  BP    Temp    Pulse 90 05/21/20 1236  Resp 14 05/21/20 1236  SpO2 99 % 05/21/20 1236  Vitals shown include unvalidated device data.  Last Pain:  Vitals:   05/21/20 1105  TempSrc: Oral         Complications: No complications documented.

## 2020-05-21 NOTE — Progress Notes (Signed)
Orthopedic Tech Progress Note Patient Details:  [name] 02-28-1985 [id number]  Ortho Devices Type of Ortho Device: CAM walker Ortho Device/Splint Location: left Ortho Device/Splint Interventions: Application   Post Interventions Patient Tolerated: Well Instructions Provided: Care of device   Ana Padilla 05/21/2020, 2:01 PM

## 2020-05-21 NOTE — Anesthesia Postprocedure Evaluation (Signed)
Anesthesia Post Note  Patient: Ana Padilla  Procedure(s) Performed: TENOLYSIS OF LEFT FOOT (Left Foot)     Patient location during evaluation: PACU Anesthesia Type: MAC Level of consciousness: awake and alert Pain management: pain level controlled Vital Signs Assessment: post-procedure vital signs reviewed and stable Respiratory status: spontaneous breathing, nonlabored ventilation, respiratory function stable and patient connected to nasal cannula oxygen Cardiovascular status: stable and blood pressure returned to baseline Postop Assessment: no apparent nausea or vomiting Anesthetic complications: no   No complications documented.  Last Vitals:  Vitals:   05/21/20 1315 05/21/20 1330  BP: (!) 134/99 125/76  Pulse: 69 68  Resp: (!) 24 16  Temp: 36.6 C 36.6 C  SpO2: 92% 93%    Last Pain:  Vitals:   05/21/20 1330  TempSrc:   PainSc: 0-No pain                 Klaire Court S

## 2020-05-23 ENCOUNTER — Encounter (HOSPITAL_COMMUNITY): Payer: Self-pay | Admitting: Podiatry

## 2020-05-26 ENCOUNTER — Encounter: Payer: Self-pay | Admitting: Obstetrics and Gynecology

## 2020-05-26 ENCOUNTER — Ambulatory Visit (INDEPENDENT_AMBULATORY_CARE_PROVIDER_SITE_OTHER): Payer: 59 | Admitting: Obstetrics and Gynecology

## 2020-05-26 ENCOUNTER — Other Ambulatory Visit: Payer: Self-pay

## 2020-05-26 VITALS — BP 124/82 | HR 88 | Ht 67.0 in | Wt 369.0 lb

## 2020-05-26 DIAGNOSIS — Z01419 Encounter for gynecological examination (general) (routine) without abnormal findings: Secondary | ICD-10-CM

## 2020-05-26 MED ORDER — LEVONORGESTREL-ETHINYL ESTRAD 0.15-30 MG-MCG PO TABS: 1.0000 | ORAL_TABLET | Freq: Every day | ORAL | 11 refills | Status: DC

## 2020-05-26 NOTE — Progress Notes (Signed)
35 y.o. G0P0000 Married [place] American female here for annual exam.    Left foot surgery last week.   She would like to discuss birth control options. She has had irregular menses on birth control in the past.  She used Aygestin to control bleeding.   She is on Dostinex for her hyperprolactinemia.  Having periods irregularly.  For the last 3 months she had just a day only.  This was predictable around the 11th of every month.   Has not received her Covid booster.   PCP:  Grier Mitts, MD  Patient's last menstrual period was 05/14/2020.     Period Duration (Days): 12 days Period Pattern: (!) Irregular Menstrual Flow: Heavy Menstrual Control: Maxi pad Menstrual Control Change Freq (Hours): changes maxi pad every 2 hours on heaviest day Dysmenorrhea: (!) Severe (severe x 2 days) Dysmenorrhea Symptoms: Cramping     Sexually active: Yes.    The current method of family planning is None.    Exercising: No.  The patient does not participate in regular exercise at present. Smoker:  no  Health Maintenance: Pap: 04-28-17 Neg:Neg HR HPV, ~2017 normal per patient History of abnormal Pap:  no MMG:  n/a Colonoscopy:  n/a BMD:   n/a  Result  n/a TDaP: 07-01-17 Gardasil:  Patient has only had 1 at age 88 HIV: 04-28-17 NR Hep C: 04-29-27 Neg Screening Labs:  PCP.    reports that she has never smoked. She has never used smokeless tobacco. She reports previous alcohol use. She reports that she does not use drugs.  Past Medical History:  Diagnosis Date  . 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 Anemia   . Elevated ferritin level   . Heel spur, left   . IBS (irritable bowel syndrome)   . Low iron   . Morbid obesity ([facility])   . Seasonal allergies   . Sickle cell trait ([place])   . Vitamin D deficiency     Past Surgical History:  Procedure Laterality Date  . ACHILLES TENDON LENGTHENING Left   . BLEPHAROPLASTY Right    lower lid x 3  . CHOLECYSTECTOMY  2016  . ELECTROLYSIS OF  MISDIRECTED LASHES Right 08/04/2017   Procedure: ELECTROLYSIS OF MISDIRECTED LASHES RIGHT EYE;  Surgeon: Clista Bernhardt, MD;  Location: [facility];  Service: Ophthalmology;  Laterality: Right;  . ENTROPIAN REPAIR Right 08/04/2017   Procedure: REPAIR OF ENTROPION WITH TARSAL STRIP RIGHT EYE;  Surgeon: Clista Bernhardt, MD;  Location: [place] [facility];  Service: Ophthalmology;  Laterality: Right;  . HEEL SPUR RESECTION Left   . HYSTEROSCOPY WITH D & C N/A 06/27/2017   Procedure: DILATATION & CURETTAGE/HYSTEROSCOPY;  Surgeon: Nunzio Cobbs, MD;  Location: [facility];  Service: Gynecology;  Laterality: N/A;  . iud placement     been removed  . TENDON REPAIR Left 05/21/2020   Procedure: TENOLYSIS OF LEFT FOOT;  Surgeon: Evelina Bucy, DPM;  Location: [facility];  Service: Podiatry;  Laterality: Left;    Current Outpatient Medications  Medication Sig Dispense Refill  . acetaminophen (TYLENOL) 500 MG tablet Take 1 tablet (500 mg total) by mouth every 6 (six) hours as needed for mild pain or moderate pain. 30 tablet 0  . cabergoline (DOSTINEX) 0.5 MG tablet Take 0.5 tablets (0.25 mg total) by mouth once a week. (Patient taking differently: Take 0.25 mg by mouth 2 (two) times a week.) 10 tablet 2  . cetirizine (ZYRTEC) 10 MG tablet Take 10 mg by mouth  daily.    . Cholecalciferol (VITAMIN D) 125 MCG (5000 UT) CAPS Take 5,000 Units by mouth daily.    . colestipol (COLESTID) 1 g tablet Take 1 tablet (1 g total) by mouth 2 (two) times daily. (Patient taking differently: Take 1 g by mouth daily.) 60 tablet 12  . cyclobenzaprine (FLEXERIL) 5 MG tablet Take 1 tablet (5 mg total) by mouth at bedtime as needed for muscle spasms. 30 tablet 0  . ibuprofen (ADVIL) 800 MG tablet Take 1 tablet (800 mg total) by mouth every 8 (eight) hours as needed. Take with food. 30 tablet 0  . Iron-Vitamins (S.S.S. TONIC PO) Take 44 mLs by mouth.     No current facility-administered medications for this visit.    Family History   Adopted: Yes  Problem Relation Age of Onset  . Sickle cell trait Mother   . Heart disease Father   . Obesity Father     Review of Systems  All other systems reviewed and are negative.   Exam:   BP 124/82 (Cuff Size: Large)   Pulse 88   Ht 5\' 7"  (1.702 m)   Wt (!) 369 lb (167.4 kg)   LMP 05/14/2020   SpO2 97%   BMI 57.79 kg/m     General appearance: alert, cooperative and appears stated age Head: normocephalic, without obvious abnormality, atraumatic Neck: no adenopathy, supple, symmetrical, trachea midline and thyroid normal to inspection and palpation Lungs: clear to auscultation bilaterally Breasts: normal appearance, no masses or tenderness, No nipple retraction or dimpling, No nipple discharge or bleeding, No axillary adenopathy Heart: regular rate and rhythm Abdomen: soft, non-tender; no masses, no organomegaly Extremities: extremities normal, atraumatic, no cyanosis or edema Skin: skin color, texture, turgor normal. No rashes or lesions Lymph nodes: cervical, supraclavicular, and axillary nodes normal. Neurologic: grossly normal  Pelvic: External genitalia:  no lesions              No abnormal inguinal nodes palpated.              Urethra:  normal appearing urethra with no masses, tenderness or lesions              Bartholins and Skenes: normal                 Vagina: normal appearing vagina with normal color and discharge, no lesions              Cervix: no lesions              Pap taken: No. Bimanual Exam:  Uterus:  normal size, contour, position, consistency, mobility, non-tender              Adnexa: no mass, fullness, tenderness        Chaperone was present for exam.  Assessment:   Well woman visit with normal exam. Hx DUB.  Hx hyperprolactinemia.  On Dostinex.  Sickle cell trait.   Plan: Mammogram screening discussed. Self breast awareness reviewed. Pap and HR HPV as above. Guidelines for Calcium, Vitamin D, regular exercise program including  cardiovascular and weight bearing exercise. Start Nordette birth control.  Declines Gardasil. Labs with PCP and endocrinology.  Follow up annually and prn.

## 2020-05-26 NOTE — Patient Instructions (Signed)

## 2020-05-27 ENCOUNTER — Encounter: Payer: 59 | Admitting: Podiatry

## 2020-05-27 ENCOUNTER — Telehealth: Payer: Self-pay | Admitting: *Deleted

## 2020-05-27 ENCOUNTER — Ambulatory Visit (INDEPENDENT_AMBULATORY_CARE_PROVIDER_SITE_OTHER): Payer: 59 | Admitting: Podiatry

## 2020-05-27 DIAGNOSIS — M7662 Achilles tendinitis, left leg: Secondary | ICD-10-CM

## 2020-05-27 DIAGNOSIS — Z9889 Other specified postprocedural states: Secondary | ICD-10-CM

## 2020-05-27 DIAGNOSIS — M7732 Calcaneal spur, left foot: Secondary | ICD-10-CM

## 2020-05-27 MED ORDER — OXYCODONE-ACETAMINOPHEN 5-325 MG PO TABS: 1.0000 | ORAL_TABLET | ORAL | 0 refills | Status: DC | PRN

## 2020-05-27 NOTE — Telephone Encounter (Signed)
Anderson Malta w/ [facility] is requesting ICD 9 code for patient's diagnosis for prescription of C 2 pain medicine so that they may include on patient's profile. Please advise.

## 2020-05-27 NOTE — Progress Notes (Signed)
Subjective:  Patient ID: Ana Padilla, female    DOB: 09-Dec-1985,  MRN: [id number]  Chief Complaint  Patient presents with  . Routine Post Op    POV #1 DOS 05/21/20 LEFT ACHILLES TENDON MICRODEBRIDMENT Denies f/c/n/v. Manageable pain lvls except when on it too much. Has a few questions about PT and when to shower.     DOS: 05/21/20 Procedure: Micro-debridement left Achilles tendon   35 y.o. female presents with the above complaint. History confirmed with patient.   Objective:  Physical Exam: no tenderness at the surgical site, no edema noted and calf supple, nontender. Incision: puncture sites healed. Assessment:   1. Calcaneal spur of left foot   2. Achilles tendinitis of left lower extremity   3. Post-operative state     Plan:  Patient was evaluated and treated and all questions answered.  Post-operative State -Protected WB for 2 weeks in boot -F/u in 2 weeks to transition out of boot -Continue limited activity for at least 1 week. -Ok to shower, no dressing needed.  No follow-ups on file.

## 2020-05-27 NOTE — Telephone Encounter (Signed)
Called and gave information to [facility] w/ [facility] ICD 9 code,verbalized understanding.

## 2020-05-29 ENCOUNTER — Ambulatory Visit: Payer: 59 | Admitting: Obstetrics and Gynecology

## 2020-06-10 ENCOUNTER — Ambulatory Visit (INDEPENDENT_AMBULATORY_CARE_PROVIDER_SITE_OTHER): Payer: 59 | Admitting: Podiatry

## 2020-06-10 ENCOUNTER — Other Ambulatory Visit: Payer: Self-pay

## 2020-06-10 DIAGNOSIS — Z9889 Other specified postprocedural states: Secondary | ICD-10-CM

## 2020-06-10 DIAGNOSIS — M7662 Achilles tendinitis, left leg: Secondary | ICD-10-CM

## 2020-06-10 MED ORDER — OXYCODONE-ACETAMINOPHEN 5-325 MG PO TABS: 1.0000 | ORAL_TABLET | ORAL | 0 refills | Status: DC | PRN

## 2020-06-10 NOTE — Progress Notes (Signed)
Subjective:  Patient ID: Ana Padilla, female    DOB: 28-Jun-1985,  MRN: [id number]  Chief Complaint  Patient presents with   Routine Post Op    POV #2 DOS 05/21/20 LEFT ACHILLES TENDON MICRODEBRIDMENT  Manageable pain lvls, denies f/c/n/v. Denies any other concerns.     DOS: 05/21/20 Procedure: Micro-debridement left Achilles tendon   35 y.o. female presents with the above complaint. History confirmed with patient. Feels like the ankle is improving and is ready for PT.  Objective:  Physical Exam: no tenderness at the surgical site, no edema noted and calf supple, nontender. Incision: puncture sites healed. Assessment:   1. Achilles tendinitis of left lower extremity   2. Post-operative state    Plan:  Patient was evaluated and treated and all questions answered.  Post-operative State -Transition out of boot. -Refer to PT -Refill pain rx.   Return in about 3 weeks (around 07/01/2020).

## 2020-06-13 ENCOUNTER — Ambulatory Visit: Payer: 59 | Admitting: Podiatry

## 2020-06-18 ENCOUNTER — Other Ambulatory Visit: Payer: Self-pay | Admitting: *Deleted

## 2020-06-18 ENCOUNTER — Telehealth: Payer: Self-pay | Admitting: *Deleted

## 2020-06-18 NOTE — Telephone Encounter (Signed)
Amy w/ [facility] [id number] [phone]) is calling to request a referral for patient. Please advise.fax # is [phone].

## 2020-06-24 NOTE — Telephone Encounter (Signed)
Filled out and faxed.

## 2020-07-01 ENCOUNTER — Other Ambulatory Visit: Payer: Self-pay | Admitting: Internal Medicine

## 2020-07-01 DIAGNOSIS — D352 Benign neoplasm of pituitary gland: Secondary | ICD-10-CM

## 2020-07-04 ENCOUNTER — Encounter: Payer: 59 | Admitting: Podiatry

## 2020-07-11 ENCOUNTER — Other Ambulatory Visit: Payer: Self-pay

## 2020-07-11 ENCOUNTER — Ambulatory Visit (INDEPENDENT_AMBULATORY_CARE_PROVIDER_SITE_OTHER): Payer: 59 | Admitting: Podiatry

## 2020-07-11 DIAGNOSIS — M722 Plantar fascial fibromatosis: Secondary | ICD-10-CM

## 2020-07-11 MED ORDER — BETAMETHASONE SOD PHOS & ACET 6 (3-3) MG/ML IJ SUSP
6.0000 mg | Freq: Once | INTRAMUSCULAR | Status: AC
  Administered 2020-07-11: 6 mg

## 2020-07-11 NOTE — Progress Notes (Signed)
Subjective:  Patient ID: Ana Padilla, female    DOB: 16-Apr-1985,  MRN: [id number]  Chief Complaint  Patient presents with   Follow-up    Patient states overall the symptoms have improved and she is happy with the outcome. Patient reports some stiffness and pain to the medial posterior aspect of the left foot.    DOS: 05/21/20 Procedure: Micro-debridement left Achilles tendon   35 y.o. female presents with the above complaint. History confirmed with patient.    Objective:  Physical Exam: no tenderness at the Achilles tendon, POP medial calc tuber. Decreased ankle joint ROM. Incision: puncture sites healed. Assessment:   1. Plantar fasciitis, left    Plan:  Patient was evaluated and treated and all questions answered.  Achilles Tendonitis -Much improved. Almost fully resolved -Continue stretching with PT. -She should be ready for return to work in approximately 1 month  Plantar fasciitis -Injection delivered left heel  Procedure: Injection Tendon/Ligament Consent: Verbal consent obtained. Location: Left plantar fascia at the glabrous junction; medial approach. Skin Prep: Alcohol. Injectate: 1 cc 0.5% marcaine plain, 1 cc betamethasone acetate-betamethasone sodium phosphate Disposition: Patient tolerated procedure well. Injection site dressed with a band-aid.  Return in about 3 weeks (around 08/01/2020) for Plantar fasciitis, Left.

## 2020-07-20 ENCOUNTER — Ambulatory Visit (HOSPITAL_COMMUNITY): Admit: 2020-07-20 | Discharge status: Against Medical Advice | Payer: 59

## 2020-07-21 ENCOUNTER — Emergency Department (HOSPITAL_COMMUNITY)
Admission: EM | Admit: 2020-07-21 | Discharge: 2020-07-21 | Disposition: A | Discharge status: Home / Self Care | Payer: 59 | Attending: Emergency Medicine | Admitting: Emergency Medicine

## 2020-07-21 ENCOUNTER — Emergency Department (HOSPITAL_COMMUNITY): Payer: 59

## 2020-07-21 ENCOUNTER — Other Ambulatory Visit: Payer: Self-pay

## 2020-07-21 ENCOUNTER — Encounter (HOSPITAL_COMMUNITY): Payer: Self-pay

## 2020-07-21 DIAGNOSIS — S46912A Strain of unspecified muscle, fascia and tendon at shoulder and upper arm level, left arm, initial encounter: Secondary | ICD-10-CM

## 2020-07-21 DIAGNOSIS — S46012A Strain of muscle(s) and tendon(s) of the rotator cuff of left shoulder, initial encounter: Secondary | ICD-10-CM | POA: Insufficient documentation

## 2020-07-21 DIAGNOSIS — M67912 Unspecified disorder of synovium and tendon, left shoulder: Secondary | ICD-10-CM

## 2020-07-21 DIAGNOSIS — S4992XA Unspecified injury of left shoulder and upper arm, initial encounter: Secondary | ICD-10-CM | POA: Diagnosis present

## 2020-07-21 DIAGNOSIS — X500XXA Overexertion from strenuous movement or load, initial encounter: Secondary | ICD-10-CM | POA: Diagnosis not present

## 2020-07-21 MED ORDER — KETOROLAC TROMETHAMINE 30 MG/ML IJ SOLN
30.0000 mg | Freq: Once | INTRAMUSCULAR | Status: AC
  Administered 2020-07-21: 30 mg via INTRAMUSCULAR
  Filled 2020-07-21: qty 1

## 2020-07-21 MED ORDER — DIAZEPAM 2 MG PO TABS
2.0000 mg | ORAL_TABLET | Freq: Once | ORAL | Status: AC
  Administered 2020-07-21: 2 mg via ORAL
  Filled 2020-07-21: qty 1

## 2020-07-21 MED ORDER — NAPROXEN 500 MG PO TABS: 500.0000 mg | ORAL_TABLET | Freq: Two times a day (BID) | ORAL | 0 refills | Status: DC

## 2020-07-21 MED ORDER — METHOCARBAMOL 500 MG PO TABS: 500.0000 mg | ORAL_TABLET | Freq: Three times a day (TID) | ORAL | 0 refills | Status: DC | PRN

## 2020-07-21 MED ORDER — METHOCARBAMOL 500 MG PO TABS
500.0000 mg | ORAL_TABLET | Freq: Once | ORAL | Status: AC
  Administered 2020-07-21: 500 mg via ORAL
  Filled 2020-07-21: qty 1

## 2020-07-21 NOTE — Discharge Instructions (Addendum)
Take the muscle relaxers and anti-inflammatories as prescribed.  Wear the sling but continue to range her shoulder several times a day.  Follow-up with orthopedics for an MRI of your shoulder.  Return to the ED with new weakness, numbness, tingling, any other concerns.

## 2020-07-21 NOTE — ED Triage Notes (Signed)
Pt states that she lifted her daughter over the porch Friday and has been having left arm pain since Friday night. Pt is unable to lift her arm and states that she is having spasms starting from the top of her arm to her hand.

## 2020-07-21 NOTE — ED Provider Notes (Signed)
[facility] DEPT Provider Note   CSN: [id number] Arrival date & time: 07/21/20  0140     History Chief Complaint  Patient presents with   Arm Injury    Left arm pain     Ana Padilla is a 35 y.o. female.  Patient here with left arm pain and shoulder pain over the past 2 days.  States she injured herself lifting her 73-year-old daughter onto a porch 2 nights ago.  Did not have pain initially but the next day had pain and spasm in her left arm and shoulder radiating down her entire left arm.  She took a leftover muscle relaxer at home without relief.  States she cannot raise her shoulder above her head.  Complains of severe pain and spasms of her lateral shoulder and upper arm.  No numbness or tingling.  No head or neck pain.  No chest pain or shortness of breath. States she did not have pain initially but was sore the next day.  Her daughter is approximately 40 pounds.  The history is provided by the patient.  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   Dysfunction of left eustachian tube 05/25/2018   Cervical polyp 04/28/2017   Morbid obesity ([place]) 04/28/2017   Hyperprolactinemia ([place]) 02/11/2017   Pituitary adenoma ([place] [facility]) 02/11/2017   Sickle cell trait ([place]) 02/01/2017   Iron deficiency anemia due to chronic blood loss 11/04/2016    Past Surgical History:  Procedure Laterality Date   ACHILLES TENDON LENGTHENING Left    BLEPHAROPLASTY Right    lower lid x 3   CHOLECYSTECTOMY  2016   ELECTROLYSIS OF MISDIRECTED LASHES Right 08/04/2017    Procedure: ELECTROLYSIS OF MISDIRECTED LASHES RIGHT EYE;  Surgeon: Clista Bernhardt, MD;  Location: [facility];  Service: Ophthalmology;  Laterality: Right;   ENTROPIAN REPAIR Right 08/04/2017   Procedure: REPAIR OF ENTROPION WITH TARSAL STRIP RIGHT EYE;  Surgeon: Clista Bernhardt, MD;  Location: [facility];  Service: Ophthalmology;  Laterality: Right;   HEEL SPUR RESECTION Left    HYSTEROSCOPY WITH D & C N/A 06/27/2017   Procedure: DILATATION & CURETTAGE/HYSTEROSCOPY;  Surgeon: Nunzio Cobbs, MD;  Location: [facility];  Service: Gynecology;  Laterality: N/A;   iud placement     been removed   TENDON REPAIR Left 05/21/2020   Procedure: TENOLYSIS OF LEFT FOOT;  Surgeon: Evelina Bucy, DPM;  Location: [facility];  Service: Podiatry;  Laterality: Left;     OB History     Gravida  0   Para  0   Term  0   Preterm  0   AB  0   Living  0      SAB  0   IAB  0   Ectopic  0   Multiple  0   Live Births  0           Family History  Adopted: Yes  Problem Relation Age of Onset   Sickle cell trait Mother    Heart disease Father    Obesity Father     Social History   Tobacco Use   Smoking status: Never   Smokeless tobacco: Never  Vaping Use   Vaping Use: Never used  Substance Use Topics   Alcohol use: Not Currently   Drug use: No    Home Medications Prior to Admission medications   Medication Sig Start Date End Date Taking? Authorizing Provider  acetaminophen (TYLENOL) 500 MG tablet Take 1 tablet (500 mg total) by mouth every 6 (six) hours as needed for mild pain or moderate pain. 06/27/17   Nunzio Cobbs, MD  cabergoline (DOSTINEX) 0.5 MG tablet Take 0.5 tablets (0.25 mg total) by mouth 2 (two) times a week. 07/03/20   Philemon Kingdom, MD  cetirizine (ZYRTEC) 10 MG tablet Take 10 mg by mouth daily.    [provider]  Cholecalciferol (VITAMIN D) 125 MCG (5000 UT) CAPS Take 5,000 Units by mouth daily.    [provider]  colestipol  (COLESTID) 1 g tablet Take 1 tablet (1 g total) by mouth 2 (two) times daily. Patient taking differently: Take 1 g by mouth daily. 12/20/19   Esterwood, Amy S, PA-C  cyclobenzaprine (FLEXERIL) 5 MG tablet Take 1 tablet (5 mg total) by mouth at bedtime as needed for muscle spasms. 05/16/20   [name], MD  ibuprofen (ADVIL) 800 MG tablet Take 1 tablet (800 mg total) by mouth every 8 (eight) hours as needed. Take with food. 11/17/19   Evelina Bucy, DPM  Iron-Vitamins (S.S.S. TONIC PO) Take 44 mLs by mouth.    [provider]  levonorgestrel-ethinyl estradiol (NORDETTE) 0.15-30 MG-MCG tablet Take 1 tablet by mouth daily. 05/26/20   Nunzio Cobbs, MD  oxyCODONE-acetaminophen (PERCOCET) 5-325 MG tablet Take 1 tablet by mouth every 4 (four) hours as needed for severe pain. 06/10/20   Evelina Bucy, DPM    Allergies    Food and Pollinex-t [modified tree tyrosine adsorbate]  Review of Systems   Review of Systems  Constitutional:  Negative for activity change, appetite change and fever.  HENT:  Negative for congestion and rhinorrhea.   Respiratory:  Negative for cough, chest tightness and shortness of breath.   Cardiovascular:  Negative for chest pain.  Gastrointestinal:  Negative for abdominal pain, nausea and vomiting.  Genitourinary:  Negative for dysuria and hematuria.  Musculoskeletal:  Positive for arthralgias and myalgias.  Skin:  Negative for rash.  Neurological:  Negative for dizziness, weakness, light-headedness and headaches.   all other systems are negative except as noted in the HPI and PMH.   Physical Exam Updated Vital Signs BP (!) 142/95 (BP Location: Right Arm)   Pulse 97   Temp 99.2 F (37.3 C) (Oral)   Resp 15   Ht 5\' 7"  (1.702 m)   Wt (!) 163.3 kg   SpO2 94%   BMI 56.38 kg/m   Physical Exam Vitals and nursing note reviewed.  Constitutional:      General: She is not in acute distress.    Appearance: She is well-developed. She is not  ill-appearing.  HENT:     Head: Normocephalic and atraumatic.     Mouth/Throat:     Pharynx: No oropharyngeal exudate.  Eyes:     Conjunctiva/sclera: Conjunctivae normal.     Pupils: Pupils are equal, round, and reactive to light.  Neck:  Comments: No meningismus. Cardiovascular:     Rate and Rhythm: Normal rate and regular rhythm.     Heart sounds: Normal heart sounds. No murmur heard. Pulmonary:     Effort: Pulmonary effort is normal. No respiratory distress.     Breath sounds: Normal breath sounds.  Chest:     Chest wall: No tenderness.  Abdominal:     Palpations: Abdomen is soft.     Tenderness: There is no abdominal tenderness. There is no guarding or rebound.  Musculoskeletal:        General: Swelling and signs of injury present. No tenderness or deformity.     Cervical back: Normal range of motion and neck supple.     Comments: Tenderness to palpation of left lateral shoulder with spasm.  Unable to AB duct without significant pain.  She denies any paraspinal cervical pain.  She is able to flex and extend the elbow without difficulty.  Pronation and supination are intact.  Cardinal hand movements are intact.   Equal grip strength radial pulses bilaterally.  Skin:    General: Skin is warm.  Neurological:     Mental Status: She is alert and oriented to person, place, and time.     Cranial Nerves: No cranial nerve deficit.     Motor: No abnormal muscle tone.     Coordination: Coordination normal.     Comments:  5/5 strength throughout. CN 2-12 intact.Equal grip strength.   Psychiatric:        Behavior: Behavior normal.    ED Results / Procedures / Treatments   Labs (all labs ordered are listed, but only abnormal results are displayed) Labs Reviewed - No data to display  EKG None  Radiology DG Elbow Complete Left  Result Date: 07/21/2020 CLINICAL DATA:  Left arm pain EXAM: LEFT ELBOW - COMPLETE 3+ VIEW COMPARISON:  None. FINDINGS: No fracture or dislocation is  seen. The joint spaces are preserved. Visualized soft tissues are within normal limits. No displaced elbow joint fat pads to suggest an elbow joint effusion. IMPRESSION: Negative. Electronically Signed   By: Julian Hy M.D.   On: 07/21/2020 03:21   DG Shoulder Left  Result Date: 07/21/2020 CLINICAL DATA:  Left arm pain EXAM: LEFT SHOULDER - 2+ VIEW COMPARISON:  None. FINDINGS: No fracture or dislocation is seen. The joint spaces are preserved. The visualized soft tissues are unremarkable. Visualized left lung is clear. IMPRESSION: Negative. Electronically Signed   By: Julian Hy M.D.   On: 07/21/2020 03:20    Procedures Procedures   Medications Ordered in ED Medications  ketorolac (TORADOL) 30 MG/ML injection 30 mg (has no administration in time range)  methocarbamol (ROBAXIN) tablet 500 mg (has no administration in time range)  diazepam (VALIUM) tablet 2 mg (has no administration in time range)    ED Course  I have reviewed the triage vital signs and the nursing notes.  Pertinent labs & imaging results that were available during my care of the patient were reviewed by me and considered in my medical decision making (see chart for details).    MDM Rules/Calculators/A&P                          Left arm pain and shoulder pain after lifting her daughter 2 nights ago.  Reduced range of motion but intact strength sensation and pulses  X-rays are negative.  Suspect patient may have a rotator cuff injury or possible other tendon tear.  Range of motion has improved after anti-inflammatories and muscle relaxers.  She is able to abductor approximately 45 degrees.  Will give a splint for comfort but instructed patient on active range of motion exercises.  We will give anti-inflammatories and muscle relaxers.  Orthopedic follow-up.  Advised she would likely need to have an MRI of her shoulder.  Return precautions discussed. Final Clinical Impression(s) / ED Diagnoses Final  diagnoses:  Strain of left shoulder, initial encounter  Rotator cuff disorder, left    Rx / DC Orders ED Discharge Orders     None        Normajean Nash, Annie Main, MD 07/21/20 [phone]

## 2020-08-01 ENCOUNTER — Encounter: Payer: 59 | Admitting: Podiatry

## 2020-08-08 ENCOUNTER — Other Ambulatory Visit: Payer: Self-pay

## 2020-08-08 ENCOUNTER — Encounter: Payer: Self-pay | Admitting: Podiatry

## 2020-08-08 ENCOUNTER — Ambulatory Visit (INDEPENDENT_AMBULATORY_CARE_PROVIDER_SITE_OTHER): Payer: 59 | Admitting: Podiatry

## 2020-08-08 DIAGNOSIS — M722 Plantar fascial fibromatosis: Secondary | ICD-10-CM

## 2020-08-08 DIAGNOSIS — M7662 Achilles tendinitis, left leg: Secondary | ICD-10-CM

## 2020-08-08 NOTE — Progress Notes (Signed)
Subjective:  Patient ID: Ana Padilla, female    DOB: November 16, 1985,  MRN: [id number]  Chief Complaint  Patient presents with   Plantar Fasciitis    F/U Lt PF -pt states," better, no pain." - no pian Tx; icing and stertch    DOS: 05/21/20 Procedure: Micro-debridement left Achilles tendon   35 y.o. female presents with the above complaint. History confirmed with patient.    Objective:  Physical Exam: no tenderness at the Achilles tendon, no POP medial calc tuber. Decreased ankle joint ROM. Incision: puncture sites healed. Assessment:   1. Plantar fasciitis, left    Plan:  Patient was evaluated and treated and all questions answered.  Achilles Tendonitis -Fully resolved. Patient has no pain. Patient is cleared for return to work and can f/u as needed should issues persist.    No follow-ups on file.

## 2020-09-23 ENCOUNTER — Ambulatory Visit (INDEPENDENT_AMBULATORY_CARE_PROVIDER_SITE_OTHER): Payer: 59 | Admitting: Podiatry

## 2020-09-23 ENCOUNTER — Other Ambulatory Visit: Payer: Self-pay

## 2020-09-23 DIAGNOSIS — M722 Plantar fascial fibromatosis: Secondary | ICD-10-CM | POA: Diagnosis not present

## 2020-09-23 MED ORDER — BETAMETHASONE SOD PHOS & ACET 6 (3-3) MG/ML IJ SUSP
6.0000 mg | Freq: Once | INTRAMUSCULAR | Status: AC
  Administered 2020-09-23: 6 mg

## 2020-09-23 NOTE — Progress Notes (Signed)
Subjective:  Patient ID: Ana Padilla, female    DOB: [birth year],  MRN: [id number]  Chief Complaint  Patient presents with   Plantar Fasciitis     Left foot pain   35 y.o. female presents with the above complaint. History confirmed with patient. States the bottom of the foot is hurting again during the day   Objective:  Physical Exam: warm, good capillary refill, no trophic changes or ulcerative lesions, normal DP and PT pulses, and normal sensory exam. Left Foot: tenderness to palpation medial calcaneal tuber, no pain with calcaneal squeeze, decreased ankle joint ROM, and +Silverskiold test Assessment:   1. Plantar fasciitis, left    Plan:  Patient was evaluated and treated and all questions answered.  Plantar Fasciitis -XR reviewed with patient -Plantar fascial brace dispensed -Injection delivered to the plantar fascia of the right foot.  Procedure: Injection Tendon/Ligament Consent: Verbal consent obtained. Location: Right plantar fascia at the glabrous junction; medial approach. Skin Prep: Alcohol. Injectate: 1 cc 0.5% marcaine plain, 1 cc betamethasone acetate-betamethasone sodium phosphate Disposition: Patient tolerated procedure well. Injection site dressed with a band-aid.  No follow-ups on file.

## 2020-10-16 ENCOUNTER — Other Ambulatory Visit: Payer: Self-pay

## 2020-10-17 ENCOUNTER — Encounter: Payer: Self-pay | Admitting: Family Medicine

## 2020-10-17 ENCOUNTER — Ambulatory Visit: Payer: 59 | Admitting: Family Medicine

## 2020-10-17 VITALS — BP 112/88 | HR 88 | Temp 99.2°F | Wt 372.0 lb

## 2020-10-17 DIAGNOSIS — E538 Deficiency of other specified B group vitamins: Secondary | ICD-10-CM | POA: Diagnosis not present

## 2020-10-17 DIAGNOSIS — R5383 Other fatigue: Secondary | ICD-10-CM

## 2020-10-17 DIAGNOSIS — Z23 Encounter for immunization: Secondary | ICD-10-CM | POA: Diagnosis not present

## 2020-10-17 DIAGNOSIS — Z862 Personal history of diseases of the blood and blood-forming organs and certain disorders involving the immune mechanism: Secondary | ICD-10-CM

## 2020-10-17 DIAGNOSIS — E559 Vitamin D deficiency, unspecified: Secondary | ICD-10-CM | POA: Diagnosis not present

## 2020-10-17 LAB — VITAMIN B12: Vitamin B-12: 555 pg/mL (ref 211–911)

## 2020-10-17 LAB — CBC WITH DIFFERENTIAL/PLATELET
Basophils Absolute: 0 10*3/uL (ref 0.0–0.1)
Basophils Relative: 0.4 % (ref 0.0–3.0)
Eosinophils Absolute: 0.2 10*3/uL (ref 0.0–0.7)
Eosinophils Relative: 2.8 % (ref 0.0–5.0)
HCT: 30.4 % — ABNORMAL LOW (ref 36.0–46.0)
Hemoglobin: 9.7 g/dL — ABNORMAL LOW (ref 12.0–15.0)
Lymphocytes Relative: 20.4 % (ref 12.0–46.0)
Lymphs Abs: 1.5 10*3/uL (ref 0.7–4.0)
MCHC: 31.8 g/dL (ref 30.0–36.0)
MCV: 61.6 fl — ABNORMAL LOW (ref 78.0–100.0)
Monocytes Absolute: 0.4 10*3/uL (ref 0.1–1.0)
Monocytes Relative: 4.8 % (ref 3.0–12.0)
Neutro Abs: 5.3 10*3/uL (ref 1.4–7.7)
Neutrophils Relative %: 71.6 % (ref 43.0–77.0)
Platelets: 401 10*3/uL — ABNORMAL HIGH (ref 150.0–400.0)
RBC: 4.94 Mil/uL (ref 3.87–5.11)
RDW: 19 % — ABNORMAL HIGH (ref 11.5–15.5)
WBC: 7.4 10*3/uL (ref 4.0–10.5)

## 2020-10-17 LAB — TSH: TSH: 2.04 u[IU]/mL (ref 0.35–5.50)

## 2020-10-17 LAB — T4, FREE: Free T4: 0.65 ng/dL (ref 0.60–1.60)

## 2020-10-17 LAB — VITAMIN D 25 HYDROXY (VIT D DEFICIENCY, FRACTURES): VITD: 29.09 ng/mL — ABNORMAL LOW (ref 30.00–100.00)

## 2020-10-17 MED ORDER — CYANOCOBALAMIN 1000 MCG/ML IJ SOLN
1000.0000 ug | Freq: Once | INTRAMUSCULAR | Status: AC
  Administered 2020-10-17: 1000 ug via INTRAMUSCULAR

## 2020-10-17 MED ORDER — VITAMIN D (ERGOCALCIFEROL) 1.25 MG (50000 UNIT) PO CAPS
50000.0000 [IU] | ORAL_CAPSULE | ORAL | 0 refills | Status: DC
Start: 2020-10-17 — End: 2021-02-23

## 2020-10-17 NOTE — Progress Notes (Signed)
Subjective:    Patient ID: Ana Padilla, female    DOB: 12-25-1985, 35 y.o.   MRN: [id number]  Chief Complaint  Patient presents with   Fatigue    Feels achy, mood changes, depressed, just exhausted. Wants levels checked for iron, B12 B12     HPI Patient is a 35 year old female with pmh sig for pituitary adenoma, history of anemia, IBS, obesity, seasonal allergies, vitamin D deficiency who was seen today for ongoing concerns.  Patient endorses increased fatigue, palpitations, and feeling depressed x2 weeks.  Patient notes similar feelings when dx'd with vitamin D deficiency, anemia, and B12 deficiency.  Having difficulty sleeping as mind races and her husband snores.    Pt inquires about weight loss medication.  Currently not exercising.  Ordering most meals 2/2 convenience.    Past Medical History:  Diagnosis Date   Adenoma of pituitary (HCC) 08/2014   Anemia    Elevated ferritin level    Heel spur, left    IBS (irritable bowel syndrome)    Low iron    Morbid obesity (HCC)    Seasonal allergies    Sickle cell trait (HCC)    Vitamin D deficiency     Allergies  Allergen Reactions   Food Other (See Comments)    Seasonal Allergies & fresh fruit---itchy mouth&nose    Pollinex-T [Modified Tree Tyrosine Adsorbate]     Itchy nose and eyes, ears, runny nose,     ROS General: Denies fever, chills, night sweats, changes in weight, changes in appetite +fatigue, wt gain HEENT: Denies headaches, ear pain, changes in vision, rhinorrhea, sore throat CV: Denies CP, palpitations, SOB, orthopnea Pulm: Denies SOB, cough, wheezing GI: Denies abdominal pain, nausea, vomiting, diarrhea, constipation GU: Denies dysuria, hematuria, frequency, vaginal discharge Msk: Denies muscle cramps, joint pains Neuro: Denies weakness, numbness, tingling Skin: Denies rashes, bruising Psych: Denies hallucinations  + depression, anxiety    Objective:    Blood pressure 112/88, pulse 88, temperature 99.2  F (37.3 C), temperature source Oral, weight (!) 372 lb (168.7 kg), SpO2 95 %.  Gen. Pleasant, well-nourished, in no distress, normal affect   HEENT: /AT, face symmetric, conjunctiva clear, no scleral icterus, PERRLA, EOMI, nares patent without drainage Lungs: no accessory muscle use Cardiovascular: RRR, no peripheral edema Musculoskeletal: No deformities, no cyanosis or clubbing, normal tone Neuro:  A&Ox3, CN II-XII intact, normal gait Skin:  Warm, no lesions/ rash   Wt Readings from Last 3 Encounters:  10/17/20 (!) 372 lb (168.7 kg)  07/21/20 (!) 360 lb (163.3 kg)  05/26/20 (!) 369 lb (167.4 kg)    Lab Results  Component Value Date   WBC 5.8 05/16/2020   HGB 10.5 (L) 05/16/2020   HCT 34.8 (L) 05/16/2020   PLT 402 (H) 05/16/2020   GLUCOSE 92 08/09/2019   CHOL 176 08/09/2019   TRIG 115 08/09/2019   HDL 72 08/09/2019   LDLCALC 83 08/09/2019   ALT 11 08/09/2019   AST 15 08/09/2019   NA 138 08/09/2019   K 4.3 08/09/2019   CL 102 08/09/2019   CREATININE 0.75 08/09/2019   BUN 12 08/09/2019   CO2 30 08/09/2019   TSH 1.24 03/19/2020   INR 0.92 08/04/2017   HGBA1C 5.1 08/09/2019    Assessment/Plan:  Vitamin B12 deficiency  - Plan: Vitamin B12, cyanocobalamin ((VITAMIN B-12)) injection 1,000 mcg  Fatigue, unspecified type  - Plan: CBC with Differential/Platelet, Iron, TIBC and Ferritin Panel, Vitamin B12, Vitamin D, 25-hydroxy, TSH, T4, Free  History of  anemia  - Plan: CBC with Differential/Platelet, Iron, TIBC and Ferritin Panel, Vitamin B12  Need for influenza vaccination  - Plan: Flu Vaccine QUAD 6+ mos PF IM (Fluarix Quad PF)  Vitamin D deficiency  - Plan: Vitamin D, Ergocalciferol, (DRISDOL) 1.25 MG (50000 UNIT) CAPS capsule  Given current symptoms we will recheck labs to assess for vitamin deficiencies/anemia.  Replete deficiencies as needed.  For continued or worsening symptoms reevaluate depression.  Counseling encouraged.  F/u in 1-2 wks prn for  continued or worsening symptoms  Update: -Vitamin D barely low at 29.09.  Rx for ergocalciferol 50,000 IU weekly x12 weeks sent to patient's pharmacy.  Microcytic anemia noted as H/H 9.7/30.4, MCV 61.6.  We will have patient restart iron supplement.  Awaiting results of iron studies.   Abbe Amsterdam, MD

## 2020-10-18 LAB — IRON,TIBC AND FERRITIN PANEL
%SAT: 10 % (calc) — ABNORMAL LOW (ref 16–45)
Ferritin: 27 ng/mL (ref 16–154)
Iron: 38 ug/dL — ABNORMAL LOW (ref 40–190)
TIBC: 377 mcg/dL (calc) (ref 250–450)

## 2020-11-07 ENCOUNTER — Ambulatory Visit: Payer: 59 | Admitting: Podiatry

## 2020-11-13 ENCOUNTER — Other Ambulatory Visit: Payer: Self-pay | Admitting: Hematology

## 2020-11-13 ENCOUNTER — Encounter: Payer: Self-pay | Admitting: Family Medicine

## 2020-11-13 ENCOUNTER — Ambulatory Visit: Payer: 59 | Admitting: Family Medicine

## 2020-11-13 VITALS — BP 118/84 | HR 74 | Temp 98.4°F | Wt 370.4 lb

## 2020-11-13 DIAGNOSIS — R519 Headache, unspecified: Secondary | ICD-10-CM

## 2020-11-13 DIAGNOSIS — F321 Major depressive disorder, single episode, moderate: Secondary | ICD-10-CM

## 2020-11-13 DIAGNOSIS — G4700 Insomnia, unspecified: Secondary | ICD-10-CM

## 2020-11-13 DIAGNOSIS — D649 Anemia, unspecified: Secondary | ICD-10-CM

## 2020-11-13 DIAGNOSIS — D500 Iron deficiency anemia secondary to blood loss (chronic): Secondary | ICD-10-CM

## 2020-11-13 DIAGNOSIS — F419 Anxiety disorder, unspecified: Secondary | ICD-10-CM

## 2020-11-13 DIAGNOSIS — E538 Deficiency of other specified B group vitamins: Secondary | ICD-10-CM

## 2020-11-13 MED ORDER — TRAZODONE HCL 50 MG PO TABS: ORAL_TABLET | ORAL | 1 refills | Status: DC

## 2020-11-13 NOTE — Progress Notes (Signed)
Subjective:    Patient ID: Ana Padilla, female    DOB: 11-14-1985, 35 y.o.   MRN: [id number]  Chief Complaint  Patient presents with   Headache    Going on a week, has not gone away.   Fatigue    Not going away, taking B12, sss tonic, vit D. Not sleeping, having nightmares.    HPI Patient is a 35 yo female with pmh sig for pituitary adenoma, hyperprolactinemia, L achilles tendonitis, IBS, anemia 2/2 chronic blood loss, sickle cell trait, vit D def, obesity who was seen today for ongoing concerns.  Pt endorses less stress, but still having anxiety, depression, and grieving (GM and close friend).  In the past Wellbutrin made her feel weird.    Pt had a L temporal HA x 1 wk.  Pain was a 5/10, increased 10/10, then decreased to an 8/10.  Tried ibuprofen, water, deep breathing, cabergoline for symptoms without relief.  BP was not elevated.  Typically does not have HAs.  Pt unsure if related to stress or lack of sleep.  Endorses mind racing when trying to sleep and becomes more aware of heart beat and breathing.  Previously tested for OSA which negative. Hydroxyzine caused residual drowsiness the next am.    Concerned about anemia given continued fatigue.  H/H 9.7/30.4 and MCV 61.6 on 10/18/20.  Taking SSS tonic.  In the past required iron txf.  Interested in conceiving and would like to stay off OCPs.  Birth control.  Previously on Aygestin and OCPs.  Followed by OB/GYN.  Would like to start a family in 1-2 yrs after getting health under control.  Pt and husband not currently using contraception.    Pt endorses MRI this yr to monitor pituitary adenoma.  Followed Endocrinology, Dr. Elvera Lennox.  Followed by Dr. Samuella Cota, Podiatry for L achilles tendonitis.  Since foot has finally stopped hurting, pt plans on getting more active.  Past Medical History:  Diagnosis Date   Adenoma of pituitary (HCC) 08/2014   Anemia    Elevated ferritin level    Heel spur, left    IBS (irritable bowel syndrome)    Low  iron    Morbid obesity (HCC)    Seasonal allergies    Sickle cell trait (HCC)    Vitamin D deficiency     Allergies  Allergen Reactions   Food Other (See Comments)    Seasonal Allergies & fresh fruit---itchy mouth&nose    Pollinex-T [Modified Tree Tyrosine Adsorbate]     Itchy nose and eyes, ears, runny nose,     ROS General: Denies fever, chills, night sweats, changes in weight, changes in appetite +fatigue, insomnia HEENT: Denies headaches, ear pain, changes in vision, rhinorrhea, sore throat +HAs CV: Denies CP, palpitations, SOB, orthopnea  +palpitations Pulm: Denies SOB, cough, wheezing GI: Denies abdominal pain, nausea, vomiting, diarrhea, constipation GU: Denies dysuria, hematuria, frequency, vaginal discharge  +heavy menses Msk: Denies muscle cramps, joint pains Neuro: Denies weakness, numbness, tingling Skin: Denies rashes, bruising Psych: Denies depression, anxiety, hallucinations  +anxiety, depression, grief     Objective:    Blood pressure 118/84, pulse 74, temperature 98.4 F (36.9 C), temperature source Oral, weight (!) 370 lb 6.4 oz (168 kg), SpO2 98 %.  Gen. Pleasant, well-nourished, in no distress, normal affect   HEENT: [place]/AT, face symmetric, conjunctiva clear, no scleral icterus, PERRLA, EOMI, nares patent without drainage Lungs: no accessory muscle use, CTAB, no wheezes or rales Cardiovascular: RRR, no m/r/g, no peripheral edema. Musculoskeletal: No  deformities, no cyanosis or clubbing, normal tone Neuro:  A&Ox3, CN II-XII intact, normal gait Skin:  Warm, no lesions/ rash  Wt Readings from Last 3 Encounters:  11/13/20 (!) 370 lb 6.4 oz (168 kg)  10/17/20 (!) 372 lb (168.7 kg)  07/21/20 (!) 360 lb (163.3 kg)    Lab Results  Component Value Date   WBC 7.4 10/17/2020   HGB 9.7 (L) 10/17/2020   HCT 30.4 (L) 10/17/2020   PLT 401.0 (H) 10/17/2020   GLUCOSE 92 08/09/2019   CHOL 176 08/09/2019   TRIG 115 08/09/2019   HDL 72 08/09/2019   LDLCALC 83  08/09/2019   ALT 11 08/09/2019   AST 15 08/09/2019   NA 138 08/09/2019   K 4.3 08/09/2019   CL 102 08/09/2019   CREATININE 0.75 08/09/2019   BUN 12 08/09/2019   CO2 30 08/09/2019   TSH 2.04 10/17/2020   INR 0.92 08/04/2017   HGBA1C 5.1 08/09/2019   GAD 7 : Generalized Anxiety Score 11/13/2020 03/19/2020  Nervous, Anxious, on Edge 2 3  Control/stop worrying 1 3  Worry too much - different things 2 3  Trouble relaxing 1 1  Restless 0 1  Easily annoyed or irritable 1 1  Afraid - awful might happen 1 0  Total GAD 7 Score 8 12  Anxiety Difficulty - Very difficult   Depression screen [facility] 2/9 11/13/2020 03/19/2020 02/19/2019  Decreased Interest 1 1 1   Down, Depressed, Hopeless 2 3 0  PHQ - 2 Score 3 4 1   Altered sleeping 3 3 1   Tired, decreased energy 3 3 2   Change in appetite 3 2 1   Feeling bad or failure about yourself  1 1 0  Trouble concentrating 0 2 1  Moving slowly or fidgety/restless 0 0 0  Suicidal thoughts 0 0 0  PHQ-9 Score 13 15 6   Difficult doing work/chores - Very difficult Somewhat difficult     Assessment/Plan:  Insomnia, unspecified type  -advised likely worsened by anxiety -discussed counseling and medication options. -continue sleep hygiene - Plan: traZODone (DESYREL) 50 MG tablet  Iron deficiency anemia due to chronic blood loss  -2/2 heavy menses -continue po iron supplementation.  Required infusions in the past. -would like to avoid OCPs or additional medication -discussed f/u with OB/Gyn and hematology - Plan: Ambulatory referral to Hematology / Oncology  Depression, major, single episode, moderate (HCC) -PHQ 9 score 13 this visit -discussed counseling and medication options -in the past Wellbutrin made pt feel weird. -will start trazodone for sleep, but may aldo help with depression sx. -continue to monitor  Anxiety -GAD 7 score 8 this visit -counseling -Discussed medication options.  Patient wishes to wait at this time -Given  handout -Continue to monitor  Acute intractable headache, unspecified headache type -resolved -Discussed possible causes including stress, worsening pituitary adenoma. -For return of panic symptoms consider repeat imaging -Discussed headache prevention including rest, hydration, limiting caffeine intake  Morbid obesity -Likely worsen by anxiety, depression, grieving -Discussed the importance of increasing physical activity and lifestyle modifications  F/u prn in 3-4 wks  Abbe Amsterdam, MD

## 2020-11-18 ENCOUNTER — Ambulatory Visit: Payer: 59 | Admitting: Podiatry

## 2020-12-10 ENCOUNTER — Ambulatory Visit: Payer: 59 | Admitting: Family Medicine

## 2020-12-10 ENCOUNTER — Encounter: Payer: Self-pay | Admitting: Family Medicine

## 2020-12-10 VITALS — BP 122/82 | HR 91 | Temp 98.1°F | Wt 373.6 lb

## 2020-12-10 DIAGNOSIS — F321 Major depressive disorder, single episode, moderate: Secondary | ICD-10-CM | POA: Diagnosis not present

## 2020-12-10 DIAGNOSIS — R5383 Other fatigue: Secondary | ICD-10-CM

## 2020-12-10 DIAGNOSIS — D508 Other iron deficiency anemias: Secondary | ICD-10-CM

## 2020-12-10 DIAGNOSIS — F411 Generalized anxiety disorder: Secondary | ICD-10-CM | POA: Diagnosis not present

## 2020-12-10 DIAGNOSIS — G4700 Insomnia, unspecified: Secondary | ICD-10-CM

## 2020-12-10 DIAGNOSIS — R002 Palpitations: Secondary | ICD-10-CM | POA: Diagnosis not present

## 2020-12-10 LAB — COMPREHENSIVE METABOLIC PANEL
ALT: 13 U/L (ref 0–35)
AST: 15 U/L (ref 0–37)
Albumin: 3.7 g/dL (ref 3.5–5.2)
Alkaline Phosphatase: 65 U/L (ref 39–117)
BUN: 10 mg/dL (ref 6–23)
CO2: 32 mEq/L (ref 19–32)
Calcium: 9.9 mg/dL (ref 8.4–10.5)
Chloride: 101 mEq/L (ref 96–112)
Creatinine, Ser: 0.7 mg/dL (ref 0.40–1.20)
GFR: 113.48 mL/min (ref >60.00)
Glucose, Bld: 91 mg/dL (ref 70–99)
Potassium: 3.7 mEq/L (ref 3.5–5.1)
Sodium: 139 mEq/L (ref 135–145)
Total Bilirubin: 0.4 mg/dL (ref 0.2–1.2)
Total Protein: 7.5 g/dL (ref 6.0–8.3)

## 2020-12-10 LAB — CBC WITH DIFFERENTIAL/PLATELET
Basophils Absolute: 0 10*3/uL (ref 0.0–0.1)
Basophils Relative: 0.2 % (ref 0.0–3.0)
Eosinophils Absolute: 0.2 10*3/uL (ref 0.0–0.7)
Eosinophils Relative: 3.2 % (ref 0.0–5.0)
HCT: 31.7 % — ABNORMAL LOW (ref 36.0–46.0)
Hemoglobin: 10.1 g/dL — ABNORMAL LOW (ref 12.0–15.0)
Lymphocytes Relative: 24.3 % (ref 12.0–46.0)
Lymphs Abs: 1.6 10*3/uL (ref 0.7–4.0)
MCHC: 31.8 g/dL (ref 30.0–36.0)
MCV: 62.2 fl — ABNORMAL LOW (ref 78.0–100.0)
Monocytes Absolute: 0.4 10*3/uL (ref 0.1–1.0)
Monocytes Relative: 5.5 % (ref 3.0–12.0)
Neutro Abs: 4.5 10*3/uL (ref 1.4–7.7)
Neutrophils Relative %: 66.8 % (ref 43.0–77.0)
Platelets: 414 10*3/uL — ABNORMAL HIGH (ref 150.0–400.0)
RBC: 5.09 Mil/uL (ref 3.87–5.11)
RDW: 19.4 % — ABNORMAL HIGH (ref 11.5–15.5)
WBC: 6.7 10*3/uL (ref 4.0–10.5)

## 2020-12-10 LAB — HEMOGLOBIN A1C: Hgb A1c MFr Bld: 5.8 % (ref 4.6–6.5)

## 2020-12-10 LAB — VITAMIN D 25 HYDROXY (VIT D DEFICIENCY, FRACTURES): VITD: 35.11 ng/mL (ref 30.00–100.00)

## 2020-12-10 LAB — VITAMIN B12: Vitamin B-12: 608 pg/mL (ref 211–911)

## 2020-12-10 LAB — TSH: TSH: 1.8 u[IU]/mL (ref 0.35–5.50)

## 2020-12-10 LAB — T4, FREE: Free T4: 0.78 ng/dL (ref 0.60–1.60)

## 2020-12-10 MED ORDER — RAMELTEON 8 MG PO TABS: 8.0000 mg | ORAL_TABLET | Freq: Every day | ORAL | 1 refills | Status: DC

## 2020-12-10 NOTE — Progress Notes (Signed)
Subjective:    Patient ID: Ana Padilla, female    DOB: [birth year], 35 y.o.   MRN: [id number]  Chief Complaint  Patient presents with   Chest Pain    Really intense pain on Sunday     HPI Patient was seen today for acute concern.  Pt endorses episode of CP Sunday night while laying in bed trying to go to sleep.  Pt states her chest was pounding and it kept her awake until 3 AM.  Patient endorses continued fatigue.  Tried trazodone for sleep but made her feel weird/groggy the next morning.  Patient was able to sleep with trazodone 50 mg.  Decreasing the dose to 25 mg did not cause pt to fall asleep, but the residual grogginess remained.  Patient notes history of irregular menses.  Felt symptoms that menses would start a few days ago however only had spotting.  Endorses negative home pregnancy test.  Patient notes continued depression and anxiety symptoms.  Endorses binge eating.  Looking into counseling with [employer] through her employer.  In the past Wellbutrin caused her to feel strange when used for weight loss.  Also tried hydroxyzine.  Requesting labs.  Also notes weight gain.  States in the past did not have PCOS.  Past Medical History:  Diagnosis Date   Adenoma of pituitary (HCC) 08/2014   Anemia    Elevated ferritin level    Heel spur, left    IBS (irritable bowel syndrome)    Low iron    Morbid obesity (HCC)    Seasonal allergies    Sickle cell trait (HCC)    Vitamin D deficiency    Family hx: unknown/limited as pt is adopted.  Allergies  Allergen Reactions   Food Other (See Comments)    Seasonal Allergies & fresh fruit---itchy mouth&nose    Pollinex-T [Modified Tree Tyrosine Adsorbate]     Itchy nose and eyes, ears, runny nose,     ROS General: Denies fever, chills, night sweats, changes in appetite + weight gain, fatigue HEENT: Denies headaches, ear pain, changes in vision, rhinorrhea, sore throat CV: Denies CP, palpitations, SOB, orthopnea Pulm: Denies SOB, cough,  wheezing GI: Denies abdominal pain, nausea, vomiting, diarrhea, constipation GU: Denies dysuria, hematuria, frequency, vaginal discharge Msk: Denies muscle cramps, joint pains Neuro: Denies weakness, numbness, tingling Skin: Denies rashes, bruising Psych: Denies hallucinations + depression, anxiety    Objective:    Blood pressure 122/82, pulse 91, temperature 98.1 F (36.7 C), temperature source Oral, weight (!) 373 lb 9.6 oz (169.5 kg), SpO2 97 %.  Gen. Pleasant, well-nourished, in no distress, normal affect   HEENT: [place]/AT, face symmetric, conjunctiva clear, no scleral icterus, PERRLA, EOMI, nares patent without drainage Lungs: no accessory muscle use, CTAB, no wheezes or rales Cardiovascular: RRR, no r/g/m, no peripheral edema Musculoskeletal: No deformities, no cyanosis or clubbing, normal tone Neuro:  A&Ox3, CN II-XII intact, normal gait Skin:  Warm, no lesions/ rash   Wt Readings from Last 3 Encounters:  11/13/20 (!) 370 lb 6.4 oz (168 kg)  10/17/20 (!) 372 lb (168.7 kg)  07/21/20 (!) 360 lb (163.3 kg)    Lab Results  Component Value Date   WBC 7.4 10/17/2020   HGB 9.7 (L) 10/17/2020   HCT 30.4 (L) 10/17/2020   PLT 401.0 (H) 10/17/2020   GLUCOSE 92 08/09/2019   CHOL 176 08/09/2019   TRIG 115 08/09/2019   HDL 72 08/09/2019   LDLCALC 83 08/09/2019   ALT 11 08/09/2019   AST  15 08/09/2019   NA 138 08/09/2019   K 4.3 08/09/2019   CL 102 08/09/2019   CREATININE 0.75 08/09/2019   BUN 12 08/09/2019   CO2 30 08/09/2019   TSH 2.04 10/17/2020   INR 0.92 08/04/2017   HGBA1C 5.1 08/09/2019   GAD 7 : Generalized Anxiety Score 12/10/2020 11/13/2020 03/19/2020  Nervous, Anxious, on Edge 3 2 3   Control/stop worrying 2 1 3   Worry too much - different things 2 2 3   Trouble relaxing 2 1 1   Restless 0 0 1  Easily annoyed or irritable 3 1 1   Afraid - awful might happen 0 1 0  Total GAD 7 Score 12 8 12   Anxiety Difficulty - - Very difficult    Depression screen [facility] 2/9  12/10/2020 11/13/2020 03/19/2020  Decreased Interest 3 1 1   Down, Depressed, Hopeless 3 2 3   PHQ - 2 Score 6 3 4   Altered sleeping 3 3 3   Tired, decreased energy 3 3 3   Change in appetite 3 3 2   Feeling bad or failure about yourself  0 1 1  Trouble concentrating 2 0 2  Moving slowly or fidgety/restless 0 0 0  Suicidal thoughts 0 0 0  PHQ-9 Score 17 13 15   Difficult doing work/chores - - Very difficult     Assessment/Plan:  GAD (generalized anxiety disorder) -Increasing -GAD-7 score 17 this visit.  Previously 8 on 11/13/2020 -Discussed counseling and medication options -Patient looking into [employer] through her employer -Discussed medication, however patient wishes to wait at this time as concerned about how it might make her feel.   -In the past Wellbutrin made patient feel tired and "weird".  Hydroxyzine caused residual drowsiness.  Zoloft caused "cloudy" feeling.  - Plan: TSH, T4, Free  Depression, major, single episode, moderate (HCC) -PHQ-9 score 17 this visit.  Previously 13 on 11/13/2020.  - Plan: TSH, T4, Free  Other fatigue -Discussed possibly multifactorial--anxiety/depression, anemia, obesity -Continue lifestyle modifications including diet changes -Patient encouraged to start exercising - Plan: CBC with Differential/Platelet, T4, Free, Vitamin D, 25-hydroxy, Vitamin B12, CMP, Hemoglobin A1c  Other iron deficiency anemia -Discussed eating iron rich foods -Continue SSS tonic -Consider iron infusion based on labs. - Plan: Iron, TIBC and Ferritin Panel, CBC with Differential/Platelet  Morbid obesity (HCC)  -Lifestyle modifications - Plan: CBC with Differential/Platelet, TSH, T4, Free, Vitamin D, 25-hydroxy, Hemoglobin A1c  Insomnia, unspecified type  -Likely 2/2 anxiety -Discontinue trazodone - Plan: ramelteon (ROZEREM) 8 MG tablet  Palpitations -Likely 2/2 anxiety -Discussed dietary changes including decreasing caffeine intake -EKG this visit with NSR, VR 78.   Improved compared to EKG from 02/19/2019. -Given strict precautions -Plan: EKG 12-lead, CBC with Differential/Platelet, TSH, T4, free  F/u as needed in the next 4-6 weeks, sooner if needed.  Given strict precautions.  Abbe Amsterdam, MD

## 2020-12-11 LAB — IRON,TIBC AND FERRITIN PANEL
%SAT: 9 % (calc) — ABNORMAL LOW (ref 16–45)
Ferritin: 39 ng/mL (ref 16–154)
Iron: 33 ug/dL — ABNORMAL LOW (ref 40–190)
TIBC: 356 mcg/dL (calc) (ref 250–450)

## 2021-01-04 DIAGNOSIS — D75A Glucose-6-phosphate dehydrogenase (G6PD) deficiency without anemia: Secondary | ICD-10-CM

## 2021-01-04 HISTORY — DX: Glucose-6-phosphate dehydrogenase (G6PD) deficiency without anemia: D75.A

## 2021-02-13 ENCOUNTER — Other Ambulatory Visit: Payer: Self-pay | Admitting: Physician Assistant

## 2021-02-20 ENCOUNTER — Ambulatory Visit: Payer: 59 | Admitting: Family Medicine

## 2021-02-23 ENCOUNTER — Ambulatory Visit: Payer: 59 | Admitting: Family Medicine

## 2021-02-23 VITALS — BP 120/80 | HR 74 | Temp 99.4°F | Resp 16 | Ht 67.0 in | Wt 369.8 lb

## 2021-02-23 DIAGNOSIS — H9192 Unspecified hearing loss, left ear: Secondary | ICD-10-CM | POA: Diagnosis not present

## 2021-02-23 DIAGNOSIS — R5383 Other fatigue: Secondary | ICD-10-CM

## 2021-02-23 DIAGNOSIS — G4700 Insomnia, unspecified: Secondary | ICD-10-CM

## 2021-02-23 DIAGNOSIS — Z8349 Family history of other endocrine, nutritional and metabolic diseases: Secondary | ICD-10-CM

## 2021-02-23 DIAGNOSIS — H938X2 Other specified disorders of left ear: Secondary | ICD-10-CM

## 2021-02-23 DIAGNOSIS — D500 Iron deficiency anemia secondary to blood loss (chronic): Secondary | ICD-10-CM | POA: Diagnosis not present

## 2021-02-23 LAB — CBC WITH DIFFERENTIAL/PLATELET
Basophils Absolute: 0 10*3/uL (ref 0.0–0.1)
Basophils Relative: 0.3 % (ref 0.0–3.0)
Eosinophils Absolute: 0.2 10*3/uL (ref 0.0–0.7)
Eosinophils Relative: 3 % (ref 0.0–5.0)
HCT: 31 % — ABNORMAL LOW (ref 36.0–46.0)
Hemoglobin: 9.9 g/dL — ABNORMAL LOW (ref 12.0–15.0)
Lymphocytes Relative: 26.1 % (ref 12.0–46.0)
Lymphs Abs: 1.9 10*3/uL (ref 0.7–4.0)
MCHC: 31.9 g/dL (ref 30.0–36.0)
MCV: 62.5 fl — ABNORMAL LOW (ref 78.0–100.0)
Monocytes Absolute: 0.4 10*3/uL (ref 0.1–1.0)
Monocytes Relative: 5.6 % (ref 3.0–12.0)
Neutro Abs: 4.8 10*3/uL (ref 1.4–7.7)
Neutrophils Relative %: 65 % (ref 43.0–77.0)
Platelets: 487 10*3/uL — ABNORMAL HIGH (ref 150.0–400.0)
RBC: 4.97 Mil/uL (ref 3.87–5.11)
RDW: 18.6 % — ABNORMAL HIGH (ref 11.5–15.5)
WBC: 7.4 10*3/uL (ref 4.0–10.5)

## 2021-02-23 LAB — LIPID PANEL
Cholesterol: 190 mg/dL (ref 0–200)
HDL: 63.1 mg/dL (ref >39.00)
LDL Cholesterol: 99 mg/dL (ref 0–99)
NonHDL: 127.06
Total CHOL/HDL Ratio: 3
Triglycerides: 140 mg/dL (ref 0.0–149.0)
VLDL: 28 mg/dL (ref 0.0–40.0)

## 2021-02-23 LAB — VITAMIN B12: Vitamin B-12: 671 pg/mL (ref 211–911)

## 2021-02-23 LAB — VITAMIN D 25 HYDROXY (VIT D DEFICIENCY, FRACTURES): VITD: 41.54 ng/mL (ref 30.00–100.00)

## 2021-02-23 MED ORDER — BELSOMRA 5 MG PO TABS: 5.0000 mg | ORAL_TABLET | Freq: Every evening | ORAL | 1 refills | Status: DC | PRN

## 2021-02-23 NOTE — Progress Notes (Signed)
Subjective:    Patient ID: Ana Padilla, female    DOB: 04-28-1985, 36 y.o.   MRN: [id number]  Chief Complaint  Patient presents with   decreased hearing in left ear   Fatigue  Patient accompanied by her husband Gordan Payment.  HPI Patient was seen today for follow-up on ongoing concerns.  Patient endorses continued decreased hearing in left ear/clogged sensation.  Requesting referral to [facility].  Patient also mentions continued fatigue slightly worse in the last few weeks.  Pt with a history of iron deficiency anemia that has required iron infusions.  In the past pt was able to manage with SSS tonic however no longer feels like it is working.  PO Iron BID in the past only mildly improved anemia.  Inquires about B12 injection and labs.  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  Patient requesting referral to cancer center at the med [facility].  Patient mentions wanting to lose weight and get better control of her health as she is hoping to conceive next year.  Patient states she ideally would like to lose 100 pounds.  Taking PNV.  Pt tried Rozerem 8 mg for insomnia but states it caused her to feel groggy the next morning.  Past Medical History:  Diagnosis Date   Adenoma of pituitary ([place]) 08/2014   Anemia    Elevated ferritin level    Heel spur, left    IBS (irritable bowel syndrome)    Low iron    Morbid obesity (HCC)    Seasonal allergies    Sickle cell trait (HCC)    Vitamin D deficiency     Allergies  Allergen Reactions   Food Other (See Comments)    Seasonal Allergies & fresh fruit---itchy mouth&nose    Pollinex-T [Modified Tree Tyrosine Adsorbate]     Itchy nose and eyes, ears, runny nose,     ROS General: Denies fever, chills, night sweats,  changes in appetite + fatigue, changes in weight, insomnia HEENT: Denies headaches, ear pain, changes in vision, rhinorrhea, sore throat + decreased hearing left ear/clogged  sensation CV: Denies CP, palpitations, SOB, orthopnea Pulm: Denies SOB, cough, wheezing GI: Denies abdominal pain, nausea, vomiting, diarrhea, constipation GU: Denies dysuria, hematuria, frequency, vaginal discharge Msk: Denies muscle cramps, joint pains Neuro: Denies weakness, numbness, tingling Skin: Denies rashes, bruising Psych: Denies depression, anxiety, hallucinations     Objective:    Blood pressure 120/80, pulse 74, temperature 99.4 F (37.4 C), resp. rate 16, height 5' 7" (1.702 m), weight (!) 369 lb 12.8 oz (167.7 kg), SpO2 97 %.  Gen. Pleasant, well-nourished, in no distress, normal affect   HEENT: [place]/AT, face symmetric, conjunctiva clear, no scleral icterus, PERRLA, EOMI, nares patent without drainage, pharynx without erythema or exudate.  External ears and canals normal without cerumen bilaterally.  TMs normal bilaterally.  No cervical lymphadenopathy. Lungs: no accessory muscle use, CTAB, no wheezes or rales Cardiovascular: RRR, no m/r/g, no peripheral edema Musculoskeletal: No deformities, no cyanosis or clubbing, normal tone Neuro:  A&Ox3, CN II-XII intact, normal gait Skin:  Warm, no lesions/ rash   Wt Readings from Last 3 Encounters:  02/23/21 (!) 369 lb 12.8 oz (167.7 kg)  12/10/20 (!) 373 lb 9.6 oz (169.5 kg)  11/13/20 (!) 370 lb 6.4 oz (168 kg)    Lab Results  Component Value Date   WBC 6.7 12/10/2020   HGB 10.1 (L) 12/10/2020   HCT 31.7 (L) 12/10/2020   PLT 414.0 (  Subjective:    Patient ID: Ana Padilla, female    DOB: 04-28-1985, 36 y.o.   MRN: [id number]  Chief Complaint  Patient presents with   decreased hearing in left ear   Fatigue  Patient accompanied by her husband Gordan Payment.  HPI Patient was seen today for follow-up on ongoing concerns.  Patient endorses continued decreased hearing in left ear/clogged sensation.  Requesting referral to [facility].  Patient also mentions continued fatigue slightly worse in the last few weeks.  Pt with a history of iron deficiency anemia that has required iron infusions.  In the past pt was able to manage with SSS tonic however no longer feels like it is working.  PO Iron BID in the past only mildly improved anemia.  Inquires about B12 injection and labs.  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  Patient requesting referral to cancer center at the med [facility].  Patient mentions wanting to lose weight and get better control of her health as she is hoping to conceive next year.  Patient states she ideally would like to lose 100 pounds.  Taking PNV.  Pt tried Rozerem 8 mg for insomnia but states it caused her to feel groggy the next morning.  Past Medical History:  Diagnosis Date   Adenoma of pituitary ([place]) 08/2014   Anemia    Elevated ferritin level    Heel spur, left    IBS (irritable bowel syndrome)    Low iron    Morbid obesity (HCC)    Seasonal allergies    Sickle cell trait (HCC)    Vitamin D deficiency     Allergies  Allergen Reactions   Food Other (See Comments)    Seasonal Allergies & fresh fruit---itchy mouth&nose    Pollinex-T [Modified Tree Tyrosine Adsorbate]     Itchy nose and eyes, ears, runny nose,     ROS General: Denies fever, chills, night sweats,  changes in appetite + fatigue, changes in weight, insomnia HEENT: Denies headaches, ear pain, changes in vision, rhinorrhea, sore throat + decreased hearing left ear/clogged  sensation CV: Denies CP, palpitations, SOB, orthopnea Pulm: Denies SOB, cough, wheezing GI: Denies abdominal pain, nausea, vomiting, diarrhea, constipation GU: Denies dysuria, hematuria, frequency, vaginal discharge Msk: Denies muscle cramps, joint pains Neuro: Denies weakness, numbness, tingling Skin: Denies rashes, bruising Psych: Denies depression, anxiety, hallucinations     Objective:    Blood pressure 120/80, pulse 74, temperature 99.4 F (37.4 C), resp. rate 16, height 5' 7" (1.702 m), weight (!) 369 lb 12.8 oz (167.7 kg), SpO2 97 %.  Gen. Pleasant, well-nourished, in no distress, normal affect   HEENT: [place]/AT, face symmetric, conjunctiva clear, no scleral icterus, PERRLA, EOMI, nares patent without drainage, pharynx without erythema or exudate.  External ears and canals normal without cerumen bilaterally.  TMs normal bilaterally.  No cervical lymphadenopathy. Lungs: no accessory muscle use, CTAB, no wheezes or rales Cardiovascular: RRR, no m/r/g, no peripheral edema Musculoskeletal: No deformities, no cyanosis or clubbing, normal tone Neuro:  A&Ox3, CN II-XII intact, normal gait Skin:  Warm, no lesions/ rash   Wt Readings from Last 3 Encounters:  02/23/21 (!) 369 lb 12.8 oz (167.7 kg)  12/10/20 (!) 373 lb 9.6 oz (169.5 kg)  11/13/20 (!) 370 lb 6.4 oz (168 kg)    Lab Results  Component Value Date   WBC 6.7 12/10/2020   HGB 10.1 (L) 12/10/2020   HCT 31.7 (L) 12/10/2020   PLT 414.0 (

## 2021-02-24 LAB — IRON,TIBC AND FERRITIN PANEL
%SAT: 12 % (calc) — ABNORMAL LOW (ref 16–45)
Ferritin: 29 ng/mL (ref 16–154)
Iron: 43 ug/dL (ref 40–190)
TIBC: 364 mcg/dL (calc) (ref 250–450)

## 2021-02-25 ENCOUNTER — Encounter: Payer: Self-pay | Admitting: Family Medicine

## 2021-03-02 ENCOUNTER — Telehealth: Payer: Self-pay | Admitting: Nurse Practitioner

## 2021-03-02 ENCOUNTER — Telehealth: Payer: Self-pay | Admitting: Family Medicine

## 2021-03-02 NOTE — Telephone Encounter (Signed)
Scheduled appt per 2/22 referral. Pt is aware of appt date and time. Pt is aware to arrive 15 mins prior to appt time and to bring and updated insurance card. Pt is aware of appt location.   

## 2021-03-02 NOTE — Telephone Encounter (Signed)
Pt does have an appt to see hemo on 03-16-2021 and is calling to see if dr banks would order iron infusion. Pt is having fatigue , trouble focusing . Please advise

## 2021-03-04 ENCOUNTER — Encounter: Payer: Self-pay | Admitting: Family Medicine

## 2021-03-04 NOTE — Telephone Encounter (Signed)
Pt is calling back and she is really fatigue and would like nurse to return her call ?

## 2021-03-09 ENCOUNTER — Telehealth: Payer: Self-pay | Admitting: Nurse Practitioner

## 2021-03-09 NOTE — Telephone Encounter (Signed)
Pt called in to r/s appt. Pt is aware of new appt date and time.  ?

## 2021-03-12 ENCOUNTER — Other Ambulatory Visit: Payer: Self-pay | Admitting: Family Medicine

## 2021-03-12 DIAGNOSIS — D500 Iron deficiency anemia secondary to blood loss (chronic): Secondary | ICD-10-CM

## 2021-03-16 ENCOUNTER — Inpatient Hospital Stay: Payer: 59 | Admitting: Nurse Practitioner

## 2021-03-19 ENCOUNTER — Inpatient Hospital Stay: Payer: 59 | Attending: Nurse Practitioner | Admitting: Nurse Practitioner

## 2021-03-19 ENCOUNTER — Inpatient Hospital Stay: Payer: 59

## 2021-03-19 ENCOUNTER — Encounter: Payer: Self-pay | Admitting: Nurse Practitioner

## 2021-03-19 VITALS — BP 144/86 | HR 74 | Temp 97.8°F | Resp 17 | Wt 368.2 lb

## 2021-03-19 DIAGNOSIS — E221 Hyperprolactinemia: Secondary | ICD-10-CM | POA: Insufficient documentation

## 2021-03-19 DIAGNOSIS — R5383 Other fatigue: Secondary | ICD-10-CM | POA: Insufficient documentation

## 2021-03-19 DIAGNOSIS — Z86018 Personal history of other benign neoplasm: Secondary | ICD-10-CM | POA: Insufficient documentation

## 2021-03-19 DIAGNOSIS — N920 Excessive and frequent menstruation with regular cycle: Secondary | ICD-10-CM | POA: Diagnosis not present

## 2021-03-19 DIAGNOSIS — D500 Iron deficiency anemia secondary to blood loss (chronic): Secondary | ICD-10-CM

## 2021-03-19 DIAGNOSIS — K589 Irritable bowel syndrome without diarrhea: Secondary | ICD-10-CM | POA: Insufficient documentation

## 2021-03-19 DIAGNOSIS — N939 Abnormal uterine and vaginal bleeding, unspecified: Secondary | ICD-10-CM | POA: Diagnosis not present

## 2021-03-19 DIAGNOSIS — F32A Depression, unspecified: Secondary | ICD-10-CM | POA: Insufficient documentation

## 2021-03-19 DIAGNOSIS — R52 Pain, unspecified: Secondary | ICD-10-CM | POA: Insufficient documentation

## 2021-03-19 DIAGNOSIS — F419 Anxiety disorder, unspecified: Secondary | ICD-10-CM | POA: Diagnosis not present

## 2021-03-19 DIAGNOSIS — D352 Benign neoplasm of pituitary gland: Secondary | ICD-10-CM | POA: Insufficient documentation

## 2021-03-19 DIAGNOSIS — E559 Vitamin D deficiency, unspecified: Secondary | ICD-10-CM | POA: Diagnosis not present

## 2021-03-19 DIAGNOSIS — Z79899 Other long term (current) drug therapy: Secondary | ICD-10-CM | POA: Diagnosis not present

## 2021-03-19 DIAGNOSIS — D573 Sickle-cell trait: Secondary | ICD-10-CM

## 2021-03-19 DIAGNOSIS — E538 Deficiency of other specified B group vitamins: Secondary | ICD-10-CM

## 2021-03-19 DIAGNOSIS — D75A Glucose-6-phosphate dehydrogenase (G6PD) deficiency without anemia: Secondary | ICD-10-CM | POA: Diagnosis not present

## 2021-03-19 LAB — CBC WITH DIFFERENTIAL (CANCER CENTER ONLY)
Abs Immature Granulocytes: 0.01 10*3/uL (ref 0.00–0.07)
Basophils Absolute: 0 10*3/uL (ref 0.0–0.1)
Basophils Relative: 0 %
Eosinophils Absolute: 0.2 10*3/uL (ref 0.0–0.5)
Eosinophils Relative: 3 %
HCT: 32.1 % — ABNORMAL LOW (ref 36.0–46.0)
Hemoglobin: 10 g/dL — ABNORMAL LOW (ref 12.0–15.0)
Immature Granulocytes: 0 %
Lymphocytes Relative: 25 %
Lymphs Abs: 1.5 10*3/uL (ref 0.7–4.0)
MCH: 20.3 pg — ABNORMAL LOW (ref 26.0–34.0)
MCHC: 31.2 g/dL (ref 30.0–36.0)
MCV: 65.2 fL — ABNORMAL LOW (ref 80.0–100.0)
Monocytes Absolute: 0.3 10*3/uL (ref 0.1–1.0)
Monocytes Relative: 5 %
Neutro Abs: 3.9 10*3/uL (ref 1.7–7.7)
Neutrophils Relative %: 67 %
Platelet Count: 477 10*3/uL — ABNORMAL HIGH (ref 150–400)
RBC: 4.92 MIL/uL (ref 3.87–5.11)
RDW: 17.6 % — ABNORMAL HIGH (ref 11.5–15.5)
Smear Review: NORMAL
WBC Count: 5.9 10*3/uL (ref 4.0–10.5)
nRBC: 0 % (ref 0.0–0.2)

## 2021-03-19 LAB — CMP (CANCER CENTER ONLY)
ALT: 19 U/L (ref 0–44)
AST: 26 U/L (ref 15–41)
Albumin: 3.8 g/dL (ref 3.5–5.0)
Alkaline Phosphatase: 72 U/L (ref 38–126)
Anion gap: 7 (ref 5–15)
BUN: 8 mg/dL (ref 6–20)
CO2: 29 mmol/L (ref 22–32)
Calcium: 9.4 mg/dL (ref 8.9–10.3)
Chloride: 104 mmol/L (ref 98–111)
Creatinine: 0.72 mg/dL (ref 0.44–1.00)
GFR, Estimated: >60 mL/min (ref >60)
Glucose, Bld: 98 mg/dL (ref 70–99)
Potassium: 3.9 mmol/L (ref 3.5–5.1)
Sodium: 140 mmol/L (ref 135–145)
Total Bilirubin: 0.4 mg/dL (ref 0.3–1.2)
Total Protein: 7.8 g/dL (ref 6.5–8.1)

## 2021-03-19 LAB — IRON AND IRON BINDING CAPACITY (CC-WL,HP ONLY)
Iron: 40 ug/dL (ref 28–170)
Saturation Ratios: 10 % — ABNORMAL LOW (ref 10.4–31.8)
TIBC: 386 ug/dL (ref 250–450)
UIBC: 346 ug/dL (ref 148–442)

## 2021-03-19 LAB — FERRITIN: Ferritin: 55 ng/mL (ref 11–307)

## 2021-03-19 LAB — RETIC PANEL
Immature Retic Fract: 16.7 % — ABNORMAL HIGH (ref 2.3–15.9)
RBC.: 4.94 MIL/uL (ref 3.87–5.11)
Retic Count, Absolute: 68.7 10*3/uL (ref 19.0–186.0)
Retic Ct Pct: 1.4 % (ref 0.4–3.1)
Reticulocyte Hemoglobin: 22.5 pg — ABNORMAL LOW (ref >27.9)

## 2021-03-19 LAB — VITAMIN B12: Vitamin B-12: 514 pg/mL (ref 180–914)

## 2021-03-19 NOTE — Progress Notes (Addendum)
?[place]   ?[phone] [phone]   ?Clinic New consult Note  ? ?Patient Care Team: ?[name], MD as PCP - General (Family Medicine) ?03/19/2021 ? ?CHIEF COMPLAINTS/PURPOSE OF CONSULTATION:  ?Iron deficiency anemia, referred by PCP Dr. Grier Mitts ? ?HISTORY OF PRESENTING ILLNESS:  ?Ana Padilla 36 y.o. female with PMH including pituitary adenoma, hyperprolactinemia, bile salt induced diarrhea, anxiety/depression, and sickle cell trait is here to reestablish care for iron deficiency anemia, initially seen by Dr. Burr Medico 11/04/2016.  She was diagnosed with iron deficiency anemia secondary to menorrhagia as a teenager. She had heavy menses q2-3 months but could last as long as 13 days. Lowest hemoglobin in the past was 8.0 with a history of IV iron, no blood transfusions. In 09/2016 CBC showed microcytic anemia with MCV 62 Hgb 9.0, normal WBC, and elevated platelet count 445K. She received Feraheme every 1-3 months from 2018-2019 and was encouraged to take Ferrous sulfate 325 mg TID. She was put on OCP to control menorrhagia. Due to low MCV she underwent hgb electrophoresis to r/o thalassemia, it showed elevated Hgb A2 4.4, elevated Hgb S 31, and low Hgb A 64.6 Which is consistent with sickle cell trait (heterozygous).  She was last seen by Dr. Burr Medico 09/01/17. Her anemia resolved from 11/28/17 until 08/09/19 with hgb 10.8, trasferrin saturation of 15% (normal iron 54 and TIBC 366). She had a ferritin level of 27 and hgb 9.7 on 10/17/20, she received B12 injection that day. Repeat labs 12/10/20 showed ferritin 39, 9% sat, serum iron 33, and normal TIBC 356 and hgb 10.1. By 02/23/21 ferritin had decreased further to 29 and she was referred back to hematology for IV iron.  ? ?Socially, she is married, has a step son. She worked for [employer] previously, now works from home.  She is independent with ADLs.  Denies alcohol, tobacco, or other drug use.  She is adopted, does not know  fathers health history and has limited information about her mothers history. ? ?Today she presents back to clinic by herself.  She knows her iron is low because she cannot concentrate, eyes burn, body aches, and feels drained.  She has been taking "SSS tonic" and iron/B vitamin supplement with 100 mg of iron intermittently for years.  She has been off for the past week.  She tolerates this much better than conventional oral iron.  Her menses remain irregular, LMP 02/09/2021 which was heavy bleeding for 5-10 days.  She is not on contraceptives at this time due to family planning and wanting to conceive in the next year or so.  She notes birth controls did not control her bleeding well anyways.  Denies other bleeding such as hematochezia, melena, epistaxis.  Denies recent infection.  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  ? ? ?MEDICAL HISTORY:  ?Past Medical History:  ?Diagnosis Date  ? Adenoma of pituitary ([place]) 08/2014  ? Anemia   ? Elevated ferritin level   ? Heel spur, left   ? IBS (irritable bowel syndrome)   ? Low iron   ? Morbid obesity ([place])   ? Seasonal allergies   ? Sickle cell trait ([place])   ? Vitamin D deficiency   ? ? ?SURGICAL HISTORY: ?Past Surgical History:  ?Procedure Laterality Date  ? ACHILLES TENDON LENGTHENING Left   ? BLEPHAROPLASTY Right   ?  lower lid x 3  ? CHOLECYSTECTOMY  2016  ? ELECTROLYSIS OF MISDIRECTED LASHES Right 08/04/2017  ? Procedure: ELECTROLYSIS OF MISDIRECTED LASHES RIGHT EYE;  Surgeon: Clista Bernhardt, MD;  Location: [facility];  Service: Ophthalmology;  Laterality: Right;  ? ENTROPIAN REPAIR Right 08/04/2017  ? Procedure: REPAIR OF ENTROPION WITH TARSAL STRIP RIGHT EYE;  Surgeon: Clista Bernhardt, MD;  Location: [facility];  Service: Ophthalmology;  Laterality: Right;  ? HEEL SPUR RESECTION Left   ? HYSTEROSCOPY WITH D & C N/A 06/27/2017  ? Procedure: DILATATION &  CURETTAGE/HYSTEROSCOPY;  Surgeon: Nunzio Cobbs, MD;  Location: [facility];  Service: Gynecology;  Laterality: N/A;  ? iud placement    ? been removed  ? TENDON REPAIR Left 05/21/2020  ? Procedure: TENOLYSIS OF LEFT FOOT;  Surgeon: Evelina Bucy, DPM;  Location: [facility];  Service: Podiatry;  Laterality: Left;  ? ? ?SOCIAL HISTORY: ?Social History  ? ?Socioeconomic History  ? Marital status: Married  ?  Spouse name: Not on file  ? Number of children: 0  ? Years of education: Not on file  ? Highest education level: Not on file  ?Occupational History  ? 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 Smoking status: Never  ? Smokeless tobacco: Never  ?Vaping Use  ? Vaping Use: Never used  ?Substance and Sexual Activity  ? Alcohol use: Not Currently  ? Drug use: No  ? Sexual activity: Yes  ?  Birth control/protection: None  ?Other Topics Concern  ? Not on file  ?Social History Narrative  ? Not on file  ? ?Social Determinants of Health  ? ?Financial Resource Strain: Not on file  ?Food Insecurity: Not on file  ?Transportation Needs: Not on file  ?Physical Activity: Unknown  ? Days of Exercise per Week: 0 days  ? Minutes of Exercise per Session: Not on file  ?Stress: Stress Concern Present  ? Feeling of Stress : Rather much  ?Social Connections: Unknown  ? Frequency of Communication with Friends and Family: Not on file  ? Frequency of Social Gatherings with Friends and Family: Not on file  ? Attends Religious Services: Not on file  ? Active Member of Clubs or Organizations: No  ? Attends Archivist Meetings: Not on file  ? Marital Status: Not on file  ?Intimate Partner Violence: Not on file  ? ? ?FAMILY HISTORY: ?Family History  ?Adopted: Yes  ?Problem Relation Age of Onset  ? Sickle cell trait Mother   ? Heart disease Father   ? Obesity Father   ? ? ?ALLERGIES:  is allergic to food and pollinex-t [modified tree tyrosine adsorbate]. ? ?MEDICATIONS:  ?Current Outpatient Medications  ?Medication Sig  Dispense Refill  ? acetaminophen (TYLENOL) 500 MG tablet Take 1 tablet (500 mg total) by mouth every 6 (six) hours as needed for mild pain or moderate pain. 30 tablet 0  ? cabergoline (DOSTINEX) 0.5 MG tablet Take 0.5 tablets (0.25 mg total) by mouth 2 (two) times a week. (Patient taking differently: Take 0.25 mg by mouth 2 (two) times a week. Mon and Thurs) 19 tablet 2  ? cetirizine (ZYRTEC) 10 MG tablet Take 10 mg by mouth daily.    ? colestipol (COLESTID) 1 g tablet Take 1 tablet (1 g total) by mouth 2 (two) times daily. **PLEASE SCHEDULE APPOINTMENT FOR FURTHER REFILLS 60 tablet 0  ? Iron-Vitamins (S.S.S. TONIC PO) Take 44 mLs by mouth.    ? methocarbamol (ROBAXIN) 500  MG tablet Take 1 tablet (500 mg total) by mouth every 8 (eight) hours as needed for muscle spasms. 20 tablet 0  ? naproxen (NAPROSYN) 500 MG tablet Take 1 tablet (500 mg total) by mouth 2 (two) times daily. 30 tablet 0  ? Suvorexant (BELSOMRA) 5 MG TABS Take 5 mg by mouth at bedtime as needed. 30 tablet 1  ? ?No current facility-administered medications for this visit.  ? ? ?REVIEW OF SYSTEMS:   ?Constitutional: Denies fevers, chills or abnormal night sweats (+) body aches (+) fatigue ?Eyes: Denies blurriness of vision, double vision or watery eyes (+) burning ?Ears, nose, mouth, throat, and face: Denies mucositis or sore throat ?Respiratory: Denies cough, dyspnea or wheezes ?Cardiovascular: Denies palpitation, chest discomfort or lower extremity swelling ?Gastrointestinal:  Denies nausea, vomiting, constipation, diarrhea, heartburn or change in bowel habits ?Skin: Denies abnormal skin rashes ?Lymphatics: Denies new lymphadenopathy or easy bruising ?Neurological:Denies numbness, tingling or new weaknesses  ?Behavioral/Psych: Mood is stable, no new changes (+) anxiety/depression (+) poor concentration ?All other systems were reviewed with the patient and are negative. ? ?PHYSICAL EXAMINATION: ?ECOG PERFORMANCE STATUS: 1 - Symptomatic but completely  ambulatory ? ?Vitals:  ? 03/19/21 1133  ?BP: (!) 144/86  ?Pulse: 74  ?Resp: 17  ?Temp: 97.8 ?F (36.6 ?C)  ?SpO2: 97%  ? ?Filed Weights  ? 03/19/21 1133  ?Weight: (!) 368 lb 4 oz (167 kg)  ? ? ?GENERAL:alert, no distress

## 2021-03-20 ENCOUNTER — Telehealth: Payer: 59 | Admitting: Family Medicine

## 2021-03-20 ENCOUNTER — Encounter: Payer: Self-pay | Admitting: Family Medicine

## 2021-03-20 ENCOUNTER — Telehealth: Payer: Self-pay | Admitting: Hematology

## 2021-03-20 DIAGNOSIS — F411 Generalized anxiety disorder: Secondary | ICD-10-CM | POA: Diagnosis not present

## 2021-03-20 DIAGNOSIS — R7303 Prediabetes: Secondary | ICD-10-CM | POA: Diagnosis not present

## 2021-03-20 DIAGNOSIS — R0683 Snoring: Secondary | ICD-10-CM

## 2021-03-20 DIAGNOSIS — D500 Iron deficiency anemia secondary to blood loss (chronic): Secondary | ICD-10-CM

## 2021-03-20 DIAGNOSIS — Z7689 Persons encountering health services in other specified circumstances: Secondary | ICD-10-CM

## 2021-03-20 LAB — FOLATE RBC
Folate, Hemolysate: 548 ng/mL
Folate, RBC: 1641 ng/mL (ref >498)
Hematocrit: 33.4 % — ABNORMAL LOW (ref 34.0–46.6)

## 2021-03-20 MED ORDER — SEMAGLUTIDE-WEIGHT MANAGEMENT 0.25 MG/0.5ML ~~LOC~~ SOAJ: 0.2500 mg | SUBCUTANEOUS | 0 refills | Status: DC

## 2021-03-20 NOTE — Progress Notes (Signed)
Virtual Visit via Video Note ? ?I connected with Coralyn Mark on 03/20/21 at 11:30 AM EDT by a video enabled telemedicine application 2/2 [id number] pandemic and verified that I am speaking with the correct person using two identifiers. ? Location patient: home ?Location provider:work or home office ?Persons participating in the virtual visit: patient, provider ? ?I discussed the limitations of evaluation and management by telemedicine and the availability of in person appointments. The patient expressed understanding and agreed to proceed. ? ?Chief Complaint  ?Patient presents with  ? Referral  ?  Referral to ENT - gave number from referral ?Anxiety referral for medicine ?Healthy wt and wellness- would like med to jump start ?Sleep study  ? ? ?HPI: ?Pt is a 36 yo female with pmh sig for history of pituitary adenoma on cabergoline, iron deficiency anemia, IBS, sickle cell trait, allergies, obesity, vitamin D deficiency, fam h/o G6PD def., L achilles tendonitis who was seen for f/u on several issues. ? ?Pt interested in starting a wt loss medication.  Also interested in seeing wt management or nutritionist.  Pt states she is motivated to lose wt.  States imcivree is an Electronics engineer approved med.  Pt doing home workouts, walking on the wknds, and going to the gym during the wk.  Wt 365 lbs this am.  Pt wants to lose at least 100 lbs in order to get healthier for potential pregnancy and for overall health (preDM).  Pt ready to get sleep study done.  Denies personal or family h/o thyroid ca or pancreatitis. ? ?Pt has appt for iron infusion set up for next Monday and the 2nd infusion one wk after that.  Previously on SSS tonic and po iron which were not effective.  Had appointment with hematology/oncology for continued anemia and recent discovery of G6PD def after genetic testing. ? ?Had first therapy appointment which went well.  Patient's therapist advised her to follow-up with psychiatry anxiety and starting  medication. ? ?ROS: See pertinent positives and negatives per HPI. ? ?Past Medical History:  ?Diagnosis Date  ? Adenoma of pituitary ([place]) 08/2014  ? Anemia   ? Elevated ferritin level   ? Heel spur, left   ? IBS (irritable bowel syndrome)   ? Low iron   ? Morbid obesity ([place])   ? Seasonal allergies   ? Sickle cell trait ([place])   ? Vitamin D deficiency   ? ? ?Past Surgical History:  ?Procedure Laterality Date  ? ACHILLES TENDON LENGTHENING Left   ? BLEPHAROPLASTY Right   ? lower lid x 3  ? CHOLECYSTECTOMY  2016  ? ELECTROLYSIS OF MISDIRECTED LASHES Right 08/04/2017  ? Procedure: ELECTROLYSIS OF MISDIRECTED LASHES RIGHT EYE;  Surgeon: Clista Bernhardt, MD;  Location: [facility];  Service: Ophthalmology;  Laterality: Right;  ? ENTROPIAN REPAIR Right 08/04/2017  ? Procedure: REPAIR OF ENTROPION WITH TARSAL STRIP RIGHT EYE;  Surgeon: Clista Bernhardt, MD;  Location: [facility];  Service: Ophthalmology;  Laterality: Right;  ? HEEL SPUR RESECTION Left   ? HYSTEROSCOPY WITH D & C N/A 06/27/2017  ? Procedure: DILATATION & CURETTAGE/HYSTEROSCOPY;  Surgeon: Nunzio Cobbs, MD;  Location: [facility];  Service: Gynecology;  Laterality: N/A;  ? iud placement    ? been removed  ? TENDON REPAIR Left 05/21/2020  ? Procedure: TENOLYSIS OF LEFT FOOT;  Surgeon: Evelina Bucy, DPM;  Location: [facility];  Service: Podiatry;  Laterality: Left;  ? ? ?Family History  ?Adopted: Yes  ?Problem Relation  Age of Onset  ? Sickle cell trait Mother   ? Heart disease Father   ? Obesity Father   ? ? ?Current Outpatient Medications:  ?  acetaminophen (TYLENOL) 500 MG tablet, Take 1 tablet (500 mg total) by mouth every 6 (six) hours as needed for mild pain or moderate pain., Disp: 30 tablet, Rfl: 0 ?  cabergoline (DOSTINEX) 0.5 MG tablet, Take 0.5 tablets (0.25 mg total) by mouth 2 (two) times a week. (Patient taking differently: Take 0.25 mg by mouth 2 (two) times a week. Mon and Thurs), Disp: 19 tablet, Rfl: 2 ?  cetirizine (ZYRTEC) 10 MG tablet, Take 10  mg by mouth daily., Disp: , Rfl:  ?  colestipol (COLESTID) 1 g tablet, Take 1 tablet (1 g total) by mouth 2 (two) times daily. **PLEASE SCHEDULE APPOINTMENT FOR FURTHER REFILLS, Disp: 60 tablet, Rfl: 0 ?  Iron-Vitamins (S.S.S. TONIC PO), Take 44 mLs by mouth., Disp: , Rfl:  ?  methocarbamol (ROBAXIN) 500 MG tablet, Take 1 tablet (500 mg total) by mouth every 8 (eight) hours as needed for muscle spasms., Disp: 20 tablet, Rfl: 0 ?  naproxen (NAPROSYN) 500 MG tablet, Take 1 tablet (500 mg total) by mouth 2 (two) times daily., Disp: 30 tablet, Rfl: 0 ?  Suvorexant (BELSOMRA) 5 MG TABS, Take 5 mg by mouth at bedtime as needed., Disp: 30 tablet, Rfl: 1 ? ?EXAM: ? ?VITALS per patient if applicable: 814 lbs, RR between 12-20 bpm ? ?GENERAL: alert, oriented, appears well and in no acute distress ? ?HEENT: atraumatic, conjunctiva clear, no obvious abnormalities on inspection of external nose and ears ? ?NECK: normal movements of the head and neck ? ?LUNGS: on inspection no signs of respiratory distress, breathing rate appears normal, no obvious gross SOB, gasping or wheezing ? ?CV: no obvious cyanosis ? ?MS: moves all visible extremities without noticeable abnormality ? ?PSYCH/NEURO: pleasant and cooperative, no obvious depression or anxiety, speech and thought processing grossly intact ? ?ASSESSMENT AND PLAN: ? ?Discussed the following assessment and plan: ? ?Snoring  ?-sleep study to evaluate for OSA ?- Plan: Ambulatory referral to Sleep Studies ? ?Morbid obesity ([place])  ?-BMI 57.68 ?-lifestyle modifications ?-will start wegovy.  Will likely need PA ?- Plan: Ambulatory referral to Sleep Studies, Semaglutide-Weight Management 0.25 MG/0.5ML SOAJ ? ?GAD (generalized anxiety disorder) ?-continue counseling ?-pt encouraged to reach out to area [facility] providers regarding setting up appt with Psychiatry. ? ?Prediabetes ?-hgb A1C 5.8% on 12/722 ?-likely 2/2 insulin resistance due to obesity ?-lifestyle modifications ?-wegovy for wt loss  likely to help decrease A1C. ? ?Encounter for weight management  ?-new problem ?-BMI 57.68 ?-discussed nutrition referral.  Pt wishes to wait at this time. ?-will start wegovy.  PA likely needed ?-encouraged to continue lifestyle modifications ?-discussed r/b/a of wegovy and other wt loss meds. ?- Plan: Semaglutide-Weight Management 0.25 MG/0.5ML SOAJ ? ?Iron deficiency anemia due to chronic blood loss ?-hgb 10 on 03/19/21.  Was 9.9 on 02/23/21. ?-feraheme infusion x 2 scheduled next wk ?-h/o G6PD def also likely contributing.  Avoid medications known to cause oxidative stress. ?-continue f/u with Heme/Onc ?  ?F/u in 4-6 wks, sooner if needed ? ?I discussed the assessment and treatment plan with the patient. The patient was provided an opportunity to ask questions and all were answered. The patient agreed with the plan and demonstrated an understanding of the instructions. ?  ?The patient was advised to call back or seek an in-person evaluation if the symptoms worsen or if the  condition fails to improve as anticipated. ? ?[name], MD  ? ?

## 2021-03-20 NOTE — Telephone Encounter (Signed)
Scheduled follow-up appointments per 3/16 los. Patient is aware. 

## 2021-03-21 ENCOUNTER — Encounter: Payer: Self-pay | Admitting: Hematology

## 2021-03-23 ENCOUNTER — Other Ambulatory Visit: Payer: Self-pay

## 2021-03-23 ENCOUNTER — Telehealth: Payer: Self-pay

## 2021-03-23 ENCOUNTER — Telehealth: Payer: Self-pay | Admitting: Family Medicine

## 2021-03-23 ENCOUNTER — Non-Acute Institutional Stay (HOSPITAL_COMMUNITY)
Admission: RE | Admit: 2021-03-23 | Discharge: 2021-03-23 | Disposition: A | Discharge status: Home / Self Care | Payer: 59 | Source: Ambulatory Visit | Attending: Internal Medicine | Admitting: Internal Medicine

## 2021-03-23 DIAGNOSIS — D500 Iron deficiency anemia secondary to blood loss (chronic): Secondary | ICD-10-CM | POA: Insufficient documentation

## 2021-03-23 MED ORDER — SODIUM CHLORIDE 0.9 % IV SOLN
510.0000 mg | Freq: Once | INTRAVENOUS | Status: AC
  Administered 2021-03-23: 510 mg via INTRAVENOUS
  Filled 2021-03-23: qty 17

## 2021-03-23 MED ORDER — SODIUM CHLORIDE 0.9 % IV SOLN
INTRAVENOUS | Status: DC | PRN
Start: 2021-03-23 — End: 2021-03-24

## 2021-03-23 NOTE — Telephone Encounter (Signed)
Pt is calling and per pharm the Semaglutide-Weight Management 0.25 MG/0.5ML SOAJ needs PA ?

## 2021-03-23 NOTE — Progress Notes (Signed)
PATIENT CARE CENTER NOTE ? ? ?Diagnosis: Iron deficiency anemia due to chronic blood loss ? ? ?Provider: Grier Mitts, MD ? ? ?Procedure: Feraheme infusion  ? ? ?Note:  Patient received Feraheme infusion (dose 1 of 1) via PIV. Tolerated well with no adverse reaction. Observed patient for 30 minutes post infusion. Vital signs stable. Patient to come back for second dose ordered by Dr. Burr Medico. Spoke with Esau Grew, RN at the [facility] and requested that [place] order be placed for patient's next Feraheme infusion. AVS offered to patient but patient refused. Patient will schedule next infusion for next Monday. Patient alert, oriented and ambulatory at discharge.   ?

## 2021-03-23 NOTE — Telephone Encounter (Signed)
Nurse called from requesting orders for IV Iron.  Nurse stated pt is getting her 1st dose of IV Iron today which orders were placed by Dr. Volanda Napoleon.  Nurse stated they need orders for pt's second dose of IV Iron.  Nurse stated she will need orders prior to scheduling pt for 2nd dose of IV Iron.  Nurse stated orders will need to be placed in "Signed & Held" status d/t they do not have access to [facility] screens.  Informed nurse that this RN will notify Cira Rue, NP of their request and have Lacie to place the orders for 2nd dose of IV Iron (Feraheme).   ?

## 2021-03-23 NOTE — Telephone Encounter (Signed)
PA sent to plan 3/20/3. ?

## 2021-03-24 ENCOUNTER — Other Ambulatory Visit: Payer: Self-pay | Admitting: Nurse Practitioner

## 2021-03-24 DIAGNOSIS — D500 Iron deficiency anemia secondary to blood loss (chronic): Secondary | ICD-10-CM

## 2021-03-26 NOTE — Telephone Encounter (Signed)
PA denied.

## 2021-03-27 LAB — ALPHA-THALASSEMIA GENOTYPR

## 2021-03-30 ENCOUNTER — Non-Acute Institutional Stay (HOSPITAL_COMMUNITY)
Admission: RE | Admit: 2021-03-30 | Discharge: 2021-03-30 | Disposition: A | Discharge status: Home / Self Care | Payer: 59 | Source: Ambulatory Visit | Attending: Internal Medicine | Admitting: Internal Medicine

## 2021-03-30 ENCOUNTER — Other Ambulatory Visit: Payer: Self-pay

## 2021-03-30 DIAGNOSIS — D500 Iron deficiency anemia secondary to blood loss (chronic): Secondary | ICD-10-CM | POA: Insufficient documentation

## 2021-03-30 MED ORDER — SODIUM CHLORIDE 0.9 % IV SOLN
INTRAVENOUS | Status: DC | PRN
Start: 2021-03-30 — End: 2021-03-31

## 2021-03-30 MED ORDER — SODIUM CHLORIDE 0.9 % IV SOLN
510.0000 mg | Freq: Once | INTRAVENOUS | Status: AC
  Administered 2021-03-30: 510 mg via INTRAVENOUS
  Filled 2021-03-30: qty 17

## 2021-03-30 NOTE — Progress Notes (Signed)
PATIENT CARE CENTER NOTE ? ? ?Diagnosis: Iron deficiency anemia due to chronic blood loss (D50.0) ? ? ?Provider: Cira Rue, NP ? ? ?Procedure: Feraheme infusion  ? ? ?Note:  Patient received Feraheme infusion (dose 1 of 1) via PIV. Tolerated well with no adverse reaction. Observed patient for 1 hour post infusion. Vital signs stable. AVS offered but patient refused. Patient alert, oriented and ambulatory at discharge.   ?

## 2021-04-16 ENCOUNTER — Telehealth: Payer: Self-pay | Admitting: Physician Assistant

## 2021-04-16 MED ORDER — COLESTIPOL HCL 1 G PO TABS: 1.0000 g | ORAL_TABLET | Freq: Two times a day (BID) | ORAL | 0 refills | Status: DC

## 2021-04-16 NOTE — Telephone Encounter (Signed)
30 days sent to patient's pharmacy. Patient must keep appointment must be kept for further refills. ?

## 2021-04-16 NOTE — Telephone Encounter (Signed)
Inbound call from patient requesting medication refill for Colestipol sent to [facility] in [place]. Patient have appt scheduled 5/1 ?

## 2021-04-20 ENCOUNTER — Inpatient Hospital Stay: Payer: 59 | Attending: Nurse Practitioner

## 2021-04-20 ENCOUNTER — Other Ambulatory Visit: Payer: Self-pay

## 2021-04-20 DIAGNOSIS — D649 Anemia, unspecified: Secondary | ICD-10-CM

## 2021-04-20 DIAGNOSIS — N920 Excessive and frequent menstruation with regular cycle: Secondary | ICD-10-CM | POA: Insufficient documentation

## 2021-04-20 DIAGNOSIS — D500 Iron deficiency anemia secondary to blood loss (chronic): Secondary | ICD-10-CM | POA: Insufficient documentation

## 2021-04-20 DIAGNOSIS — E538 Deficiency of other specified B group vitamins: Secondary | ICD-10-CM

## 2021-04-20 LAB — CBC WITH DIFFERENTIAL (CANCER CENTER ONLY)
Abs Immature Granulocytes: 0.03 10*3/uL (ref 0.00–0.07)
Basophils Absolute: 0 10*3/uL (ref 0.0–0.1)
Basophils Relative: 0 %
Eosinophils Absolute: 0.2 10*3/uL (ref 0.0–0.5)
Eosinophils Relative: 3 %
HCT: 33.8 % — ABNORMAL LOW (ref 36.0–46.0)
Hemoglobin: 10.5 g/dL — ABNORMAL LOW (ref 12.0–15.0)
Immature Granulocytes: 1 %
Lymphocytes Relative: 25 %
Lymphs Abs: 1.7 10*3/uL (ref 0.7–4.0)
MCH: 21.2 pg — ABNORMAL LOW (ref 26.0–34.0)
MCHC: 31.1 g/dL (ref 30.0–36.0)
MCV: 68.3 fL — ABNORMAL LOW (ref 80.0–100.0)
Monocytes Absolute: 0.4 10*3/uL (ref 0.1–1.0)
Monocytes Relative: 6 %
Neutro Abs: 4.3 10*3/uL (ref 1.7–7.7)
Neutrophils Relative %: 65 %
Platelet Count: 424 10*3/uL — ABNORMAL HIGH (ref 150–400)
RBC: 4.95 MIL/uL (ref 3.87–5.11)
RDW: 19.9 % — ABNORMAL HIGH (ref 11.5–15.5)
WBC Count: 6.6 10*3/uL (ref 4.0–10.5)
nRBC: 0 % (ref 0.0–0.2)

## 2021-04-20 LAB — IRON AND IRON BINDING CAPACITY (CC-WL,HP ONLY)
Iron: 64 ug/dL (ref 28–170)
Saturation Ratios: 19 % (ref 10.4–31.8)
TIBC: 335 ug/dL (ref 250–450)
UIBC: 271 ug/dL (ref 148–442)

## 2021-04-20 LAB — RETIC PANEL
Immature Retic Fract: 17.5 % — ABNORMAL HIGH (ref 2.3–15.9)
RBC.: 5.01 MIL/uL (ref 3.87–5.11)
Retic Count, Absolute: 68.1 10*3/uL (ref 19.0–186.0)
Retic Ct Pct: 1.4 % (ref 0.4–3.1)
Reticulocyte Hemoglobin: 26.3 pg — ABNORMAL LOW (ref >27.9)

## 2021-04-20 LAB — FERRITIN: Ferritin: 327 ng/mL — ABNORMAL HIGH (ref 11–307)

## 2021-04-23 ENCOUNTER — Other Ambulatory Visit: Payer: Self-pay

## 2021-04-23 ENCOUNTER — Telehealth: Payer: Self-pay

## 2021-04-23 NOTE — Telephone Encounter (Signed)
Pt LVM wanting to know if she would need more iron infusions.  Pt stated she wants to know the results of her recent labs and the result of a special lab Dr. Burr Medico had drawn on her in March.  Spoke with pt regarding her recent labs.  Informed pt that based off her recent labs, her ferritin and Iron levels are WNL.  Pt's Hbg and HCT are low but not to the level where the pt requires blood transfusions.  Pt state Dr. Burr Medico ordered a special lab to be drawn in March and she never got the results of that lab.  This RN did not see where any additional labs were drawn but will ask Dr. Burr Medico when she returns to office on Tuesday, 04/28/2021.  Pt also wanted to know should she continue to see Dr. Burr Medico for her low Hbg/Hct or should she just f/u with her GYN because her PCP told her to f/u with her GYN since the pt's anemia is r/t the pt's heavy menstrual cycles.  Informed pt that this RN will f/u with Dr. Burr Medico regarding the pt's concerns and will send the pt a MyChart message of Dr. Ernestina Penna response.  Pt verbalized understanding and has no further questions or concerns at this time.  ?

## 2021-04-29 NOTE — Progress Notes (Deleted)
GYNECOLOGY  VISIT   HPI: 36 y.o.   Married  [place] American  female   [id number] with No LMP recorded. (Menstrual status: Irregular Periods).   here for     GYNECOLOGIC HISTORY: No LMP recorded. (Menstrual status: Irregular Periods). 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   Dysfunction of left eustachian tube 05/25/2018   Cervical polyp 04/28/2017   Morbid obesity ([place]) 04/28/2017   Hyperprolactinemia ([place]) 02/11/2017   Pituitary adenoma ([place]) 02/11/2017   Sickle cell trait ([place]) 02/01/2017   Iron deficiency anemia due to chronic blood loss 11/04/2016    Past Medical History:  Diagnosis Date   Adenoma of pituitary ([place]) 08/2014   Anemia    Elevated ferritin level    Heel spur, left    IBS (irritable bowel syndrome)    Low iron    Morbid obesity ([facility])    Seasonal allergies    Sickle cell trait ([facility])    Vitamin D deficiency     Past Surgical History:  Procedure Laterality Date   ACHILLES TENDON LENGTHENING Left    BLEPHAROPLASTY Right    lower lid x 3   CHOLECYSTECTOMY  2016   ELECTROLYSIS OF MISDIRECTED LASHES Right 08/04/2017   Procedure: ELECTROLYSIS OF MISDIRECTED LASHES RIGHT EYE;  Surgeon: Clista Bernhardt, MD;  Location: [facility];  Service: Ophthalmology;  Laterality: Right;   ENTROPIAN REPAIR Right 08/04/2017   Procedure: REPAIR OF ENTROPION WITH TARSAL STRIP RIGHT EYE;  Surgeon: Clista Bernhardt, MD;  Location: [facility];  Service: Ophthalmology;  Laterality: Right;   HEEL SPUR RESECTION Left    HYSTEROSCOPY WITH D & C N/A 06/27/2017   Procedure:  DILATATION & CURETTAGE/HYSTEROSCOPY;  Surgeon: Nunzio Cobbs, MD;  Location: [facility];  Service: Gynecology;  Laterality: N/A;   iud placement     been removed   TENDON REPAIR Left 05/21/2020   Procedure: TENOLYSIS OF LEFT FOOT;  Surgeon: Evelina Bucy, DPM;  Location: [facility];  Service: Podiatry;  Laterality: Left;    Current Outpatient Medications  Medication Sig Dispense Refill   acetaminophen (TYLENOL) 500 MG tablet Take 1 tablet (500 mg total) by mouth every 6 (six) hours as needed for mild pain or moderate pain. 30 tablet 0   cabergoline (DOSTINEX) 0.5 MG tablet Take 0.5 tablets (0.25 mg total) by mouth 2 (two) times a week. (Patient taking differently: Take 0.25 mg by mouth 2 (two) times a week. Mon and Thurs) 19 tablet 2   cetirizine (ZYRTEC) 10 MG tablet Take 10 mg by mouth daily.     colestipol (COLESTID) 1 g tablet Take 1 tablet (1 g total) by mouth 2 (two) times daily. **PLEASE KEEP APPOINTMENT FOR FURTHER REFILLS 60 tablet 0   Iron-Vitamins (S.S.S. TONIC PO) Take 44 mLs  by mouth.     methocarbamol (ROBAXIN) 500 MG tablet Take 1 tablet (500 mg total) by mouth every 8 (eight) hours as needed for muscle spasms. 20 tablet 0   naproxen (NAPROSYN) 500 MG tablet Take 1 tablet (500 mg total) by mouth 2 (two) times daily. 30 tablet 0   Semaglutide-Weight Management 0.25 MG/0.5ML SOAJ Inject 0.25 mg into the skin once a week. 2 mL 0   Suvorexant (BELSOMRA) 5 MG TABS Take 5 mg by mouth at bedtime as needed. 30 tablet 1   No current facility-administered medications for this visit.     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  Tobacco Use   Smoking status: Never   Smokeless tobacco: Never  Vaping Use   Vaping Use: Never used  Substance and Sexual Activity   Alcohol use: Not Currently   Drug use: No   Sexual activity: Yes    Birth control/protection: None  Other Topics Concern   Not on file  Social History Narrative   Not on file   Social Determinants of Health   Financial Resource Strain: Not on file  Food Insecurity: Not on file  Transportation Needs: Not on file  Physical Activity: Unknown   Days of Exercise per Week: 0 days   Minutes of Exercise per Session: Not on file  Stress: Stress Concern Present   Feeling of Stress : Rather much  Social Connections: Unknown   Frequency of Communication with Friends and Family: Not on file   Frequency of Social Gatherings with Friends and Family: Not on file   Attends Religious Services: Not on file   Active Member of Clubs or Organizations: No   Attends Archivist Meetings: Not on file   Marital Status: Not on file  Intimate Partner Violence: Not on file    Review of Systems  PHYSICAL EXAMINATION:    There were no vitals taken for this visit.    General appearance: alert, cooperative and appears stated age Head: Normocephalic, without obvious abnormality, atraumatic Neck: no adenopathy, supple, symmetrical, trachea midline and thyroid normal to inspection and palpation Lungs: clear to auscultation bilaterally Breasts: normal appearance, no masses or tenderness, No nipple retraction or dimpling, No nipple discharge or bleeding, No axillary or supraclavicular adenopathy Heart: regular rate and rhythm Abdomen: soft, non-tender, no masses,  no organomegaly Extremities: extremities normal, atraumatic, no cyanosis or edema Skin: Skin color, texture, turgor normal. No rashes or lesions Lymph nodes: Cervical, supraclavicular, and axillary nodes normal. No abnormal inguinal  nodes palpated Neurologic: Grossly normal  Pelvic: External genitalia:  no lesions              Urethra:  normal appearing urethra with no masses, tenderness or lesions              Bartholins and Skenes: normal                 Vagina: normal appearing vagina with normal color and discharge, no lesions  Cervix: no lesions                Bimanual Exam:  Uterus:  normal size, contour, position, consistency, mobility, non-tender              Adnexa: no mass, fullness, tenderness              Rectal exam: {yes no:[id number]}.  Confirms.              Anus:  normal sphincter tone, no lesions  Chaperone was present for exam:  ***  ASSESSMENT     PLAN     An After Visit Summary was printed and given to the patient.  ______ minutes face to face time of which over 50% was spent in counseling.

## 2021-05-04 ENCOUNTER — Encounter: Payer: Self-pay | Admitting: Physician Assistant

## 2021-05-04 ENCOUNTER — Ambulatory Visit: Payer: 59 | Admitting: Physician Assistant

## 2021-05-04 VITALS — BP 126/80 | HR 90 | Ht 67.0 in | Wt 367.2 lb

## 2021-05-04 DIAGNOSIS — K9089 Other intestinal malabsorption: Secondary | ICD-10-CM | POA: Diagnosis not present

## 2021-05-04 MED ORDER — COLESTIPOL HCL 1 G PO TABS: 1.0000 g | ORAL_TABLET | Freq: Two times a day (BID) | ORAL | 4 refills | Status: DC

## 2021-05-04 NOTE — Progress Notes (Signed)
? ?Subjective:  ? ? Patient ID: Ana Padilla, female    DOB: Sep 17, 1985, 36 y.o.   MRN: [id number] ? ?HPI ?Ana Padilla is a pleasant 36 year old African-American female, established with Dr. Marina Goodell, and also known to myself.  She comes in today for medication refill.  Was last seen here in December 2021.  Patient is status post cholecystectomy and has history of bile salt induced diarrhea.  She does well with colestipol 1 g daily, occasionally needing twice daily dosing. ?Other medical problems include morbid obesity, sickle cell trait, history of iron deficiency anemia related to menorrhagia.  She is followed by hematology. ?She has no complaints of abdominal pain, no melena or hematochezia, no problems with diarrhea as long as she stays on the colestipol. ?If she does not take the colestipol she may have 3-4 bowel movements daily which will be loose. ? ?She is contemplating pregnancy within the next year or so, wants to try to lose some weight etc. prior to conceiving.  She asks whether colestipol is safe with pregnancy. ? ?Review of Systems Pertinent positive and negative review of systems were noted in the above HPI section.  All other review of systems was otherwise negative.  ? ?Outpatient Encounter Medications as of 05/04/2021  ?Medication Sig  ? acetaminophen (TYLENOL) 500 MG tablet Take 1 tablet (500 mg total) by mouth every 6 (six) hours as needed for mild pain or moderate pain.  ? cabergoline (DOSTINEX) 0.5 MG tablet Take 0.5 tablets (0.25 mg total) by mouth 2 (two) times a week. (Patient taking differently: Take 0.25 mg by mouth 2 (two) times a week. Mon and Thurs)  ? cetirizine (ZYRTEC) 10 MG tablet Take 10 mg by mouth daily.  ? Iron-Vitamins (S.S.S. TONIC PO) Take 44 mLs by mouth.  ? methocarbamol (ROBAXIN) 500 MG tablet Take 1 tablet (500 mg total) by mouth every 8 (eight) hours as needed for muscle spasms.  ? naproxen (NAPROSYN) 500 MG tablet Take 1 tablet (500 mg total) by mouth 2 (two) times daily.  ?  [DISCONTINUED] colestipol (COLESTID) 1 g tablet Take 1 tablet (1 g total) by mouth 2 (two) times daily. **PLEASE KEEP APPOINTMENT FOR FURTHER REFILLS  ? colestipol (COLESTID) 1 g tablet Take 1 tablet (1 g total) by mouth 2 (two) times daily.  ? [DISCONTINUED] Semaglutide-Weight Management 0.25 MG/0.5ML SOAJ Inject 0.25 mg into the skin once a week.  ? [DISCONTINUED] Suvorexant (BELSOMRA) 5 MG TABS Take 5 mg by mouth at bedtime as needed.  ? ?No facility-administered encounter medications on file as of 05/04/2021.  ? ?Allergies  ?Allergen Reactions  ? Food Other (See Comments)  ?  Seasonal Allergies & fresh fruit---itchy mouth&nose   ? Pollinex-T [Modified Tree Tyrosine Adsorbate]   ?  Itchy nose and eyes, ears, runny nose,   ? ?Patient Active Problem List  ? Diagnosis Date Noted  ? Tendonitis, Achilles, left   ? 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 Dysfunction of left eustachian tube 05/25/2018  ? Cervical polyp 04/28/2017  ? Morbid obesity (H[facility]) 04/28/2017  ? Hyperprolactinemia ([facility]) 02/11/2017  ? Pituitary adenoma ([facility]) 02/11/2017  ? Sickle cell trait ([facility]) 02/01/2017  ? Iron deficiency anemia due to chronic blood loss 11/04/2016  ? ?Social History  ? ?Socioeconomic History  ? Marital status: Married  ?  Spouse name: Not on file  ? Number of children: 0  ? Years of education: Not on file  ? Highest education level: Not on file  ?Occupational History  ? 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 Smoking status: Never  ? Smokeless tobacco: Never  ?Vaping Use  ? Vaping Use: Never used  ?Substance and Sexual Activity  ? Alcohol use: Not Currently  ? Drug use: No  ? Sexual activity: Yes  ?  Birth control/protection: None  ?Other Topics Concern  ? Not on file  ?Social History Narrative  ? Not on file  ? ?Social Determinants of Health  ? ?Financial Resource Strain: Not on file  ?Food Insecurity: Not on file  ?Transportation Needs: Not on file  ?Physical Activity: Unknown  ? Days of Exercise per Week: 0 days   ? Minutes of Exercise per Session: Not on file  ?Stress: Stress Concern Present  ? Feeling of Stress : Rather much  ?Social Connections: Unknown  ? Frequency of Communication with Friends and Family: Not on file  ? Frequency of Social Gatherings with Friends and Family: Not on file  ? Attends Religious Services: Not on file  ? Active Member of Clubs or Organizations: No  ? Attends Banker Meetings: Not on file  ? Marital Status: Not on file  ?Intimate Partner Violence: Not on file  ? ? ?Ms. Lammert's family history includes Heart disease in her father; Obesity in her father; Sickle cell trait in her mother. She was adopted. ? ? ?   ?Objective:  ?  ?Vitals:  ? 05/04/21 0956  ?BP: 126/80  ?Pulse: 90  ?SpO2: 98%  ? ? ?Physical Exam Well-developed well-nourished AA female in no acute distress.  Height, [id number]  BMI 57.5 ? ?HEENT; nontraumatic normocephalic, EOMI, PE R LA, sclera anicteric. ? ?Extremities; no clubbing cyanosis or edema skin warm and dry ?Neuro/Psych; alert and oriented x4, grossly nonfocal mood and affect appropriate  ? ? ? ?   ?Assessment & Plan:  ? ?#34 36 year old African-American female with bile salt induced diarrhea, well controlled with colestipol 1 g daily, occasionally requiring twice daily dosing. ? ?#2 morbid obesity ?#3.  Iron deficiency anemia secondary to menorrhagia followed by hematology ?#4.  Pituitary adenoma ?#5.  Sickle cell trait ? ?Plan; refill colestipol 1 g p.o. daily, may use twice daily as needed-refill sent x1 year ?Patient advised that should she become pregnant, may be best to go off of colestipol at least for the first trimester, there is no good data regarding safety in pregnancy with humans,/recommend risk-benefit assessment. ?Plan to follow-up in 1 year, sooner as needed ? ?Mckinzee Spirito S Naleigha Raimondi PA-C ?05/04/2021 ? ? ?Cc: Deeann Saint, MD ?  ?

## 2021-05-04 NOTE — Progress Notes (Signed)
Noted  

## 2021-05-04 NOTE — Patient Instructions (Addendum)
If you are age 34 or younger, your body mass index should be between 19-25. Your Body mass index is 57.51 kg/m?Marland Kitchen If this is out of the aformentioned range listed, please consider follow up with your Primary Care Provider.  ?________________________________________________________ ? ?The [facility] providers would like to encourage you to use [facility] to communicate with providers for non-urgent requests or questions.  Due to long hold times on the telephone, sending your provider a message by [facility] may be a faster and more efficient way to get a response.  Please allow 48 business hours for a response.  Please remember that this is for non-urgent requests.  ?_______________________________________________________ ? ?Refills of Colestipol have been sent to your pharmacy. ? ?Follow up in 1 year or sooner if needed. ? ?Thank you for entrusting me with your care and choosing [facility]. ? ?Nicoletta Ba, PA-C ?

## 2021-05-06 ENCOUNTER — Ambulatory Visit: Payer: 59 | Admitting: Obstetrics and Gynecology

## 2021-05-11 ENCOUNTER — Encounter: Payer: Self-pay | Admitting: Radiology

## 2021-05-11 ENCOUNTER — Encounter: Payer: Self-pay | Admitting: Hematology

## 2021-05-11 ENCOUNTER — Ambulatory Visit: Payer: 59 | Admitting: Radiology

## 2021-05-11 VITALS — BP 120/82 | Ht 67.0 in | Wt 365.0 lb

## 2021-05-11 DIAGNOSIS — N921 Excessive and frequent menstruation with irregular cycle: Secondary | ICD-10-CM

## 2021-05-11 DIAGNOSIS — N926 Irregular menstruation, unspecified: Secondary | ICD-10-CM | POA: Diagnosis not present

## 2021-05-11 LAB — PREGNANCY, URINE: Preg Test, Ur: NEGATIVE

## 2021-05-11 NOTE — Progress Notes (Signed)
? ? ? ? ? ? ?  Irregular Menstruation ? ?Subjective: ?36yo G0 presents with c/o irregular bleeding for the past 3 months.Cycles have been long, lasting 7-12 days.  Since February she has experienced heavy cycles with large, brown, stringy clots, worsening cramping. She is also experiencing bleeding between cycles. She has been trying to lose weight, working with [employer] [facility]. She is interested in trying to get pregnant once she loses some weight and 'feels healthier'. ? ?Review of Systems  ?Constitutional: Negative.   ?HENT: Negative.    ?Eyes: Negative.   ?Respiratory: Negative.    ?Cardiovascular: Negative.   ?Gastrointestinal: Negative.   ?Endocrine: Negative.   ?Genitourinary:  Positive for menstrual problem and vaginal bleeding.  ?Musculoskeletal: Negative.   ?Skin: Negative.   ?Allergic/Immunologic: Negative.   ?Neurological: Negative.   ?Psychiatric/Behavioral: Negative.     ? ?Objective: ? ?Pelvic exam: normal external genitalia, vulva, vagina, cervix, uterus and adnexa, VULVA: normal appearing vulva with no masses, tenderness or lesions, VAGINA: normal appearing vagina with normal color and discharge, no lesions, CERVIX: normal appearing cervix without discharge or lesions, UTERUS: uterus is normal size, shape, consistency and nontender, ADNEXA: normal adnexa in size, nontender and no masses, RECTAL: normal rectal, no masses. ? ?Testing: ?UPT:  neg ?Chaperone offered and declined for exam ? ?Assessment/Plan: ?1. Irregular periods ? ?- Pregnancy, urine ? ?2. Menometrorrhagia ? ?- US Transvaginal Non-OB; Future  ?Discussed with patient she will meet with Dr Quincy Simmonds after the u/s to further discuss management. ?

## 2021-05-13 ENCOUNTER — Ambulatory Visit (INDEPENDENT_AMBULATORY_CARE_PROVIDER_SITE_OTHER): Payer: 59 | Admitting: Neurology

## 2021-05-13 ENCOUNTER — Encounter: Payer: Self-pay | Admitting: Neurology

## 2021-05-13 VITALS — BP 111/70 | HR 82 | Ht 67.0 in | Wt 370.0 lb

## 2021-05-13 DIAGNOSIS — R5383 Other fatigue: Secondary | ICD-10-CM | POA: Diagnosis not present

## 2021-05-13 DIAGNOSIS — G4719 Other hypersomnia: Secondary | ICD-10-CM | POA: Diagnosis not present

## 2021-05-13 DIAGNOSIS — Z9189 Other specified personal risk factors, not elsewhere classified: Secondary | ICD-10-CM | POA: Diagnosis not present

## 2021-05-13 DIAGNOSIS — Z6841 Body Mass Index (BMI) 40.0 and over, adult: Secondary | ICD-10-CM

## 2021-05-13 NOTE — Patient Instructions (Addendum)
Thank you for choosing [facility] for your sleep related care! It was nice to meet you today!  ? ?Here is what we discussed today:  ?  ?Based on your symptoms and your exam I believe you may be at risk for obstructive sleep apnea (aka OSA). We should proceed with a sleep study to determine whether you do or do not have OSA and how severe it is. Even, if you have mild OSA, I may want you to consider treatment with CPAP, as treatment of even borderline or mild sleep apnea can result and improvement of symptoms such as sleep disruption, daytime sleepiness, nighttime bathroom breaks, restless leg symptoms, improvement of headache syndromes, even improved mood disorder.  ? ?As explained, an attended sleep study (meaning you get to stay overnight in the sleep lab), lets [place] monitor sleep-related behaviors such as sleep talking and leg movements in sleep, in addition to monitoring for sleep apnea.  A home sleep test is a screening tool for sleep apnea diagnosis only, but unfortunately, does not help with any other sleep-related diagnoses. ? ?Please remember, the long-term risks and ramifications of untreated moderate to severe obstructive sleep apnea may include (but are not limited to): increased risk for cardiovascular disease, including congestive heart failure, stroke, difficult to control hypertension, treatment resistant obesity, arrhythmias, especially irregular heartbeat commonly known as A. Fib. (atrial fibrillation); even type 2 diabetes has been linked to untreated OSA.  ? ?Other correlations that untreated obstructive sleep apnea include macular edema which is swelling of the retina in the eyes, droopy eyelid syndrome, and elevated hemoglobin and hematocrit levels (often referred to as polycythemia). ? ?Sleep apnea can cause disruption of sleep and sleep deprivation in most cases, which, in turn, can cause recurrent headaches, problems with memory, mood, concentration, focus, and vigilance.  Most people with untreated sleep apnea report excessive daytime sleepiness, which can affect their ability to drive. Please do not drive or use heavy equipment or machinery, if you feel sleepy! Patients with sleep apnea can also develop difficulty initiating and maintaining sleep (aka insomnia).  ? ?Having sleep apnea may increase your risk for other sleep disorders, including involuntary behaviors sleep such as sleep terrors, sleep talking, sleepwalking.   ? ?Having sleep apnea can also increase your risk for restless leg syndrome and leg movements at night.  ? ?Please note that untreated obstructive sleep apnea may carry additional perioperative morbidity. Patients with significant obstructive sleep apnea (typically, in the moderate to severe degree) should receive, if possible, perioperative PAP (positive airway pressure) therapy and the surgeons and particularly the anesthesiologists should be informed of the diagnosis and the severity of the sleep disordered breathing.  ? ?I understand, that you would like to wait and hold off on sleep testing for now, and schedule evaluations with your attention specialist and therapists first.  ?If you change your mind regarding sleep testing, you can call us or email [place] through [facility].  ? ?

## 2021-05-13 NOTE — Progress Notes (Signed)
Subjective:  ?  ?Patient ID: Ana Padilla is a 36 y.o. female. ? ?HPI ? ? ? ?Star Age, MD, PhD ?[facility] ?[address] ?[phone] ?[place], [place] [address] ? ?Dear Dr. Volanda Napoleon, ? ?I saw your patient, Ana Padilla, upon your kind request in my sleep clinic today for initial consultation of her sleep disorder, in particular, concern for obstructive sleep apnea.  The patient is unaccompanied today.  As you know, Ms. [name] is a 36 year old right-handed woman with an underlying medical history of prediabetes, sickle cell trait, vitamin D deficiency, pituitary adenoma, anemia, irritable bowel syndrome, seasonal allergies and morbid obesity with a BMI of over 50, who reports excessive daytime somnolence and chronic difficulty initiating and maintaining sleep.  She reports no snoring.  She is not known to have apneic pauses while asleep.  She denies waking up with a headache.  She has nocturia about twice per average night.  She does not drink any daily caffeine, soda about twice a week.  She does not drink any alcohol and is a non-smoker.  I reviewed your virtual visit note from 03/20/2021.  Her Epworth sleepiness score is 10 out of 24, fatigue severity score is 50 out of 63.  She is working on weight loss.  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  Bedtime is around 1 AM and rise time around 8 AM.  She lives with her husband.  For his sleep onset and maintenance difficulties she has tried ramelteon in the past as well as trazodone and hydroxyzine.  She is supposed to start [place] as I understand.  She feels that fatigue has been ongoing for years.  She also reports a longstanding history of heavy menstrual periods and anemia.  She is supposed to see an attention specialist soon and a therapist.  She endorses anxiety and depression.  She would like to hold off on sleep testing until she has seen her other specialists and if they would like for her to have a sleep study, she  would pursue it at the time.  At this juncture, she is not ready to pursue a sleep study.  She reports that she came here for her husband and wanted to get checked out as well, if a sleep study was needed.  I encouraged her to get checked out, she reports that she had a sleep study some 10 years ago which was normal.  Prior sleep study results are not available for my review today. ? ?Her Past Medical History Is Significant For: ?Past Medical History:  ?Diagnosis Date  ? Adenoma of pituitary ([place]) 08/2014  ? Anemia   ? Elevated ferritin level   ? Heel spur, left   ? IBS (irritable bowel syndrome)   ? Low iron   ? Morbid obesity ([place])   ? Seasonal allergies   ? Sickle cell trait ([place])   ? Vitamin D deficiency   ? ? ?Her Past Surgical History Is Significant For: ?Past Surgical History:  ?Procedure Laterality Date  ? ACHILLES TENDON LENGTHENING Left   ? BLEPHAROPLASTY Right   ? lower lid x 3  ? CHOLECYSTECTOMY  2016  ? ELECTROLYSIS OF MISDIRECTED LASHES Right 08/04/2017  ? Procedure: ELECTROLYSIS OF MISDIRECTED LASHES RIGHT EYE;  Surgeon: Clista Bernhardt, MD;  Location: [facility];  Service: Ophthalmology;  Laterality: Right;  ? ENTROPIAN REPAIR Right 08/04/2017  ? Procedure: REPAIR OF ENTROPION WITH TARSAL STRIP RIGHT EYE;  Surgeon: Clista Bernhardt, MD;  Location: [facility];  Service: Ophthalmology;  Laterality: Right;  ? HEEL SPUR RESECTION Left   ? HYSTEROSCOPY WITH D & C N/A 06/27/2017  ? Procedure: DILATATION & CURETTAGE/HYSTEROSCOPY;  Surgeon: Nunzio Cobbs, MD;  Location: [facility];  Service: Gynecology;  Laterality: N/A;  ? iud placement    ? been removed  ? TENDON REPAIR Left 05/21/2020  ? Procedure: TENOLYSIS OF LEFT FOOT;  Surgeon: Evelina Bucy, DPM;  Location: [facility];  Service: Podiatry;  Laterality: Left;  ? ? ?Her Family History Is Significant For: ?Family History  ?Adopted: Yes  ?Problem Relation Age of Onset  ? Sickle cell trait Mother   ? Heart disease Father   ? Obesity Father   ? Colon cancer Neg  Hx   ? Esophageal cancer Neg Hx   ? Stomach cancer Neg Hx   ? Sleep apnea Neg Hx   ? ? ?Her Social History Is Significant For: ?Social History  ? ?Socioeconomic History  ? Marital status: Married  ?  Spouse name: Not on file  ? Number of children: 0  ? Years of education: Not on file  ? Highest education level: Not on file  ?Occupational History  ? 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 Smoking status: Never  ? Smokeless tobacco: Never  ?Vaping Use  ? Vaping Use: Never used  ?Substance and Sexual Activity  ? Alcohol use: Not Currently  ? Drug use: No  ? Sexual activity: Yes  ?  Partners: Male  ?  Birth control/protection: None  ?Other Topics Concern  ? Not on file  ?Social History Narrative  ? Not on file  ? ?Social Determinants of Health  ? ?Financial Resource Strain: Not on file  ?Food Insecurity: Not on file  ?Transportation Needs: Not on file  ?Physical Activity: Unknown  ? Days of Exercise per Week: 0 days  ? Minutes of Exercise per Session: Not on file  ?Stress: Stress Concern Present  ? Feeling of Stress : Rather much  ?Social Connections: Unknown  ? Frequency of Communication with Friends and Family: Not on file  ? Frequency of Social Gatherings with Friends and Family: Not on file  ? Attends Religious Services: Not on file  ? Active Member of Clubs or Organizations: No  ? Attends Archivist Meetings: Not on file  ? Marital Status: Not on file  ? ? ?Her Allergies Are:  ?Allergies  ?Allergen Reactions  ? Food Other (See Comments)  ?  Seasonal Allergies & fresh fruit---itchy mouth&nose   ? Pollinex-T [Modified Tree Tyrosine Adsorbate]   ?  Itchy nose and eyes, ears, runny nose,   ?:  ? ?Her Current Medications Are:  ?Outpatient Encounter Medications as of 05/13/2021  ?Medication Sig  ? acetaminophen (TYLENOL) 500 MG tablet Take 1 tablet (500 mg total) by mouth every 6 (six) hours as needed for mild pain or moderate pain.  ? BIOTIN PO Take by mouth.  ? cabergoline (DOSTINEX) 0.5 MG tablet  Take 0.5 tablets (0.25 mg total) by mouth 2 (two) times a week. (Patient taking differently: Take 0.25 mg by mouth 2 (two) times a week. Mon and Thurs)  ? cetirizine (ZYRTEC) 10 MG tablet Take 10 mg by mouth daily.  ? colestipol (COLESTID) 1 g tablet Take 1 tablet (1 g total) by mouth 2 (two) times daily.  ? Cyanocobalamin (B-12 PO) Take by mouth.  ? Iron-Vitamins (S.S.S. TONIC PO) Take 44 mLs by mouth.  ? KLS  ALLER-FLO 50 MCG/ACT nasal spray Place into both nostrils.  ? methocarbamol (ROBAXIN) 500 MG tablet Take 1 tablet (500 mg total) by mouth every 8 (eight) hours as needed for muscle spasms. (Patient taking differently: Take 500 mg by mouth as needed for muscle spasms.)  ? naproxen (NAPROSYN) 500 MG tablet Take 1 tablet (500 mg total) by mouth 2 (two) times daily. (Patient taking differently: Take 500 mg by mouth as needed.)  ? olopatadine (PATADAY) 0.1 % ophthalmic solution 1 drop 2 (two) times daily.  ? Prenatal Vit-Fe Fumarate-FA (MULTIVITAMIN-PRENATAL) 27-0.8 MG TABS tablet Take 1 tablet by mouth daily at 12 noon.  ? ?No facility-administered encounter medications on file as of 05/13/2021.  ?: ? ? ?Review of Systems:  ?Out of a complete 14 point review of systems, all are reviewed and negative with the exception of these symptoms as listed below: ? ? ?Review of Systems  ?Neurological:   ?     Pt here for sleep consult  pt states fatigue . Pt denies snoring,headaches,CPAP machine ,hypertension. Pt states she did have sleep study done 10 years ago  ? ?ESS:10 ?FSS:50  ? ?Objective:  ?Neurological Exam ? ?Physical Exam ?Physical Examination:  ? ?Vitals:  ? 05/13/21 0915  ?BP: 111/70  ?Pulse: 82  ? ? ?General Examination: The patient is a very pleasant 36 y.o. female in no acute distress. She appears well-developed and well-nourished and well groomed.  ? ?HEENT: Normocephalic, atraumatic, pupils are equal, round and reactive to light, tracking is well-preserved, face is symmetric with normal facial animation,  speech is clear without dysarthria, hypophonia or voice tremor.  Neck without limitation of movements.  Airway examination reveals moderate airway crowding secondary to wider tongue, tonsillar size of about

## 2021-05-28 ENCOUNTER — Telehealth: Payer: 59 | Admitting: Family Medicine

## 2021-06-08 NOTE — Progress Notes (Unsigned)
GYNECOLOGY  VISIT   HPI: 36 y.o.   Married  [place] American  female   [id number] with No LMP recorded. (Menstrual status: Irregular Periods).   here for pelvic ultrasound for recurrent irregular/heavy menses.  Took birth control pills and stopped them shortly after her last annual exam.  Periods have been monthly.  Lasting 7 - 10 days.   However, since January, had spotting for 3 - 4 days, and then period a week later, lasting 10 - 12 days.  Currently bleeding for 22 days.  Still dealing with anemia and not feeling well.   Last intercourse was at least one month ago.  Negative UPT 05/11/21.   Not currently trying for pregnancy.   Had Mirena IUD in the past x 2.  First IUD was malpositioned, and it was immediately removed.  Second IUD placement was painful and she had irregular cycles following this, and it was removed.   She has taking multiple combined oral contraceptives in the past and has used Aygestin to control her bleeding.   Status post hysteroscopy with dilation and curettage on 06/27/17 and final pathology report showed benign endometrial polyp.   GYNECOLOGIC HISTORY: No LMP recorded. (Menstrual status: Irregular Periods). 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   Dysfunction of left eustachian tube 05/25/2018   Cervical polyp 04/28/2017   Morbid obesity ([place]) 04/28/2017   Hyperprolactinemia ([place]) 02/11/2017   Pituitary adenoma ([place]) 02/11/2017   Sickle cell trait ([place]) 02/01/2017   Iron deficiency anemia due to chronic blood  loss 11/04/2016    Past Medical History:  Diagnosis Date   Adenoma of pituitary ([place]) 08/2014   Anemia    Elevated ferritin level    Heel spur, left    IBS (irritable bowel syndrome)    Low iron    Morbid obesity ([facility])    Seasonal allergies    Sickle cell trait ([place])    Vitamin D deficiency     Past Surgical History:  Procedure Laterality Date   ACHILLES TENDON LENGTHENING Left    BLEPHAROPLASTY Right    lower lid x 3   CHOLECYSTECTOMY  2016   ELECTROLYSIS OF MISDIRECTED LASHES Right 08/04/2017   Procedure: ELECTROLYSIS OF MISDIRECTED LASHES RIGHT EYE;  Surgeon: Clista Bernhardt, MD;  Location: [facility];  Service: Ophthalmology;  Laterality: Right;   ENTROPIAN REPAIR Right 08/04/2017   Procedure: REPAIR OF ENTROPION WITH TARSAL STRIP RIGHT EYE;  Surgeon: Clista Bernhardt, MD;  Location: [facility];  Service: Ophthalmology;  Laterality: Right;   HEEL SPUR RESECTION Left    HYSTEROSCOPY WITH D & C N/A 06/27/2017   Procedure: DILATATION & CURETTAGE/HYSTEROSCOPY;  Surgeon: Nunzio Cobbs, MD;  Location: [facility];  Service: Gynecology;  Laterality: N/A;  iud placement     been removed   TENDON REPAIR Left 05/21/2020   Procedure: TENOLYSIS OF LEFT FOOT;  Surgeon: Evelina Bucy, DPM;  Location: [facility];  Service: Podiatry;  Laterality: Left;    Current Outpatient Medications  Medication Sig Dispense Refill   acetaminophen (TYLENOL) 500 MG tablet Take 1 tablet (500 mg total) by mouth every 6 (six) hours as needed for mild pain or moderate pain. 30 tablet 0   BIOTIN PO Take by mouth.     cabergoline (DOSTINEX) 0.5 MG tablet Take 0.5 tablets (0.25 mg total) by mouth 2 (two) times a week. (Patient taking differently: Take 0.25 mg by mouth 2 (two) times a week. Mon and Thurs) 19 tablet 2   cetirizine (ZYRTEC) 10 MG tablet Take 10 mg by mouth daily.     colestipol (COLESTID) 1 g tablet Take 1 tablet (1 g total) by mouth 2 (two) times daily. 180 tablet 4   Cyanocobalamin (B-12 PO) Take  by mouth.     Iron-Vitamins (S.S.S. TONIC PO) Take 44 mLs by mouth.     KLS ALLER-FLO 50 MCG/ACT nasal spray Place into both nostrils.     medroxyPROGESTERone (PROVERA) 10 MG tablet Take 1 tablet (10 mg total) by mouth daily. 10 tablet 0   methocarbamol (ROBAXIN) 500 MG tablet Take 1 tablet (500 mg total) by mouth every 8 (eight) hours as needed for muscle spasms. (Patient taking differently: Take 500 mg by mouth as needed for muscle spasms.) 20 tablet 0   naproxen (NAPROSYN) 500 MG tablet Take 1 tablet (500 mg total) by mouth 2 (two) times daily. (Patient taking differently: Take 500 mg by mouth as needed.) 30 tablet 0   olopatadine (PATADAY) 0.1 % ophthalmic solution 1 drop 2 (two) times daily.     Prenatal Vit-Fe Fumarate-FA (MULTIVITAMIN-PRENATAL) 27-0.8 MG TABS tablet Take 1 tablet by mouth daily at 12 noon.     venlafaxine XR (EFFEXOR XR) 37.5 MG 24 hr capsule Take 1 capsule (37.5 mg total) by mouth daily with breakfast. 30 capsule 1   No current facility-administered medications for this visit.     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  Tobacco Use   Smoking status: Never   Smokeless tobacco: Never  Vaping Use   Vaping Use: Never used  Substance and Sexual Activity   Alcohol use: Not Currently   Drug use: No   Sexual activity: Yes    Partners: Male    Birth control/protection: None  Other Topics Concern   Not on file  Social History Narrative   Not on file    Social Determinants of Health   Financial Resource Strain: Not on file  Food Insecurity: Not on file  Transportation Needs: Not on file  Physical Activity: Unknown (11/10/2020)   Exercise Vital Sign    Days of  Exercise per Week: 0 days    Minutes of Exercise per Session: Not on file  Stress: Stress Concern Present (11/10/2020)   [place]    Feeling of Stress : Rather much  Social Connections: Unknown (11/10/2020)   Social Connection and Isolation Panel [NHANES]    Frequency of Communication with Friends and Family: Not on file    Frequency of Social Gatherings with Friends and Family: Not on file    Attends Religious Services: Not on file    Active Member of Clubs or Organizations: No    Attends Archivist Meetings: Not on file    Marital Status: Not on file  Intimate Partner Violence: Not on file    Review of Systems  All other systems reviewed and are negative.   PHYSICAL EXAMINATION:    BP 126/84   Ht '5\' 7"'$  (1.702 m)   Wt (!) 365 lb (165.6 kg)   BMI 57.17 kg/m     General appearance: alert, cooperative and appears stated age Lungs: clear to auscultation bilaterally Heart: regular rate and rhythm Abdomen: soft, non-tender, no masses,  no organomegaly Extremities: extremities normal, atraumatic, no cyanosis or edema No abnormal inguinal nodes palpated  Pelvic: External genitalia:  no lesions              Urethra:  normal appearing urethra with no masses, tenderness or lesions              Bartholins and Skenes: normal                 Vagina: normal appearing vagina with normal color and discharge, no lesions              Cervix: no lesions                Pelvic US Uterus 9.3 x 5.67 x 4.39 cm.  No myometrial masses.  EMS 19.69 mm.   Several feeder vessels. Left ovary 3.84 x 2.22 x. 1.76 cm.  Normal follicles. Right ovary 3.81 x 2.31 x 2.90 cm.  Normal follicles.  No adnexal masses.  No  free fluid.   EMB Consent for endometrial biopsy. Speculum placed.  Hibiclens prep.  Patient unable to have EMB due to anxiety.   Procedure aborted.   Chaperone was present for exam:  Estill Bamberg, CMA  ASSESSMENT  Endometrial thickening.  Recurrent abnormal uterine bleeding.  Inability to tolerate office endometrial sampling.  BM 57.17.  Hx pituitary adenoma.  On Dostinex. Chronic anemia.   PLAN  Provera 10 mg x 10 day.  Will plan to proceed with hysteroscopy, possible Myosure resection of endometrium, and dilation and curettage in outpatient setting.   Risks, benefits, and alternatives reviewed. Risks include but are not limited to bleeding, infection, damage to surrounding organs including uterine perforation requiring hospitalization and laparoscopy, pulmonary edema, reaction to anesthesia, DVT, PE, death, need for further treatment. Patient wishes to proceed.   An After Visit Summary was printed and given to the patient.  38 min  total time was spent for this patient encounter, including preparation, face-to-face counseling with the patient, coordination of care, and documentation of the encounter.

## 2021-06-10 ENCOUNTER — Telehealth: Payer: Self-pay

## 2021-06-10 ENCOUNTER — Telehealth: Payer: 59 | Admitting: Family Medicine

## 2021-06-10 NOTE — Telephone Encounter (Signed)
---  CP which she thinks may be anxiety. Symptoms of insomnia. Occasional CP. Started approx 2 days ago. Was occasional, thinks it is increased anxiety. Spoke with her therapist and discussed seeing pcp about taking anxiety meds. No SOB.  06/09/2021 4:34:27 PM See PCP within 24 Hours Yes Parrott, RN, [place]  Comments User: Hamilton Capri, RN Date/Time (Eastern Time): 06/09/2021 4:29:50 PM Menstrual Cycle for past 3 weeks. Not normal bleeding for her. See's GYN on Thursday.  User: Hamilton Capri, RN Date/Time Eilene Ghazi Time): 06/09/2021 4:30:43 PM Period has eased off to only seeing when she wipes yesterday. Has not seen blood so far today.  Referrals REFERRED TO PCP OFFICE  On 06/09/21, pt scheduled herself for video visit. Patient needs in office appt, unable to come today. Appt scheduled with Dr [place]ique on 06/12/21

## 2021-06-11 ENCOUNTER — Ambulatory Visit (INDEPENDENT_AMBULATORY_CARE_PROVIDER_SITE_OTHER): Payer: 59

## 2021-06-11 ENCOUNTER — Ambulatory Visit (INDEPENDENT_AMBULATORY_CARE_PROVIDER_SITE_OTHER): Payer: 59 | Admitting: Obstetrics and Gynecology

## 2021-06-11 VITALS — BP 126/84 | Ht 67.0 in | Wt 365.0 lb

## 2021-06-11 DIAGNOSIS — N921 Excessive and frequent menstruation with irregular cycle: Secondary | ICD-10-CM | POA: Diagnosis not present

## 2021-06-11 DIAGNOSIS — R9389 Abnormal findings on diagnostic imaging of other specified body structures: Secondary | ICD-10-CM | POA: Diagnosis not present

## 2021-06-11 MED ORDER — MEDROXYPROGESTERONE ACETATE 10 MG PO TABS: 10.0000 mg | ORAL_TABLET | Freq: Every day | ORAL | 0 refills | Status: DC

## 2021-06-11 NOTE — Patient Instructions (Signed)
Dilation and Curettage or Vacuum Curettage Dilation and curettage (D&C) and vacuum curettage are minor procedures. A D&C involves stretching the cervix (dilation) and scraping the inside lining of the uterus, or endometrium, with surgical instruments (curettage). During a D&C, tissue is gently scraped starting from the top of the uterus down to the lowest part of the uterus. During a vacuum curettage, the lining and tissue in the uterus are removed using gentle suction. Curettage may be performed to either diagnose or treat a problem. A diagnostic curettage may be done if you have: Irregular bleeding or clotting from the uterus. Spotting between menstrual periods, prolonged menstrual periods, or other abnormal bleeding. Bleeding after menopause. No menstrual period (amenorrhea). A change in the size and shape of the uterus. Abnormal endometrial cells discovered during a Pap test. For treatment, curettage may be done: To remove an IUD (intrauterine device). To remove the remaining placenta after giving birth. During an abortion or after a miscarriage. To remove growths in the lining of the uterus. To remove certain rare types of noncancerous lumps (fibroids). Tell a health care provider about: Any allergies you have, including allergies to prescribed medicine or latex. All medicines you are taking, including vitamins, herbs, eye drops, creams, and over-the-counter medicines. Any problems you or family members have had with anesthetic medicines. Any blood disorders you have. Any surgeries you have had. Your medical history and any medical conditions you have. Whether you are pregnant or may be pregnant. Recent vaginal infections you have had. Recent menstrual periods, bleeding problems you have had, and what form of birth control (contraception) you use. What are the risks? Generally, this is a safe procedure. However, problems may occur, including: Infection. Heavy vaginal  bleeding. Allergic reactions to medicines. Damage to the cervix or nearby structures or organs. Scar tissue developing inside the uterus. This can cause abnormal periods and may make it harder to get pregnant. A hole (perforation) in the wall of the uterus. This is rare. What happens before the procedure? Staying hydrated Follow instructions from your health care provider about hydration, which may include: Up to 2 hours before the procedure - you may continue to drink clear liquids, such as water, clear fruit juice, black coffee, and plain tea.  Eating and drinking restrictions Follow instructions from your health care provider about eating and drinking, which may include: 8 hours before the procedure - stop eating heavy meals or foods, such as meat, fried foods, or fatty foods. 6 hours before the procedure - stop eating light meals or foods, such as toast or cereal. 6 hours before the procedure - stop drinking milk or drinks that contain milk. 2 hours before the procedure - stop drinking clear liquids. If your health care provider told you to take your medicine on the day of your procedure, take them with only a sip of water. Medicines Ask your health care provider about: Changing or stopping your regular medicines. This is especially important if you are taking diabetes medicines or blood thinners. Taking medicines such as aspirin and ibuprofen. These medicines can thin your blood. Do not take these medicines unless your health care provider tells you to take them. Taking over-the-counter medicines, vitamins, herbs, and supplements. You may be given a medicine to soften the cervix. This will help with dilation. Tests You may be given a pregnancy test on the day of the procedure. You may have a blood or urine sample taken. General instructions Do not use any products that contain nicotine or tobacco  for at least 4 weeks before the procedure. These products include cigarettes, chewing  tobacco, and vaping devices, such as e-cigarettes. If you need help quitting, ask your health care provider. For 24 hours before your procedure: Do not douche, use tampons, or have sex. Do not use medicines, creams, or suppositories in the vagina. Ask your health care provider what steps will be taken to help prevent infection. These may include: Removing hair at the procedure site. Washing skin with a germ-killing soap. Taking antibiotic medicine. Plan to have a responsible adult take you home from the hospital or clinic. If you will be going home right after the procedure, plan to have a responsible adult care for you for the time you are told. This is important. What happens during the procedure?  An IV will be inserted into one of your veins. You will be given one of the following: A medicine that numbs the area in and around the cervix (local anesthetic). A medicine to make you fall asleep (general anesthetic). You will lie down on your back, with your feet in foot rests (stirrups). The size and position of your uterus will be checked. A lubricated instrument (speculum or Sims retractor) will be inserted into your vagina to widen its walls. This will allow your health care provider to see your cervix. Your cervix will be softened and dilated. This may be done by: Taking medicine by mouth or vaginally. Having thin rods or gradual widening instruments inserted into your cervix. A small, sharp, curved instrument (curette) will be used to scrape a small amount of tissue or cells from the endometrium or cervical canal. In some cases, gentle suction is applied with the curette. The cells will be taken to a lab for testing. The procedure may vary among health care providers and hospitals. What happens after the procedure? Your blood pressure, heart rate, breathing rate, and blood oxygen level will be monitored until you leave the hospital or clinic. You may have mild cramping, a backache,  pain, and light bleeding or spotting. You may pass small blood clots from your vagina. You may have to wear compression stockings. These stockings help to prevent blood clots and reduce swelling in your legs. It is up to you to get the results of your procedure. Ask your health care provider, or the department that is doing the procedure, when your results will be ready. Summary Dilation and curettage (D&C) involves stretching (dilating) the cervix and scraping the inside lining of the uterus with surgical instruments (curettage). Follow your health care provider's instructions about when to stop eating and drinking before the procedure, and whether to stop or change any medicines. After the procedure, you may have mild cramping, a backache, pain, and light bleeding or spotting. You may pass small blood clots from your vagina. Plan to have a responsible adult take you home from the hospital or clinic. This information is not intended to replace advice given to you by your health care provider. Make sure you discuss any questions you have with your health care provider. Document Revised: 12/12/2019 Document Reviewed: 12/12/2019 [employer] Patient Education  2023 [employer] Hysteroscopy Hysteroscopy is a procedure used to look inside a woman's womb (uterus). This may be done for various reasons, including: To look for tumors and other growths in the uterus. To evaluate abnormal bleeding, fibroid tumors, polyps, scar tissue, or uterine cancer. To determine why a woman is unable to get pregnant or has had repeated pregnancy losses. To locate an IUD (  intrauterine device). To place a birth control device into the fallopian tubes. During this procedure, a thin, flexible tube with a small light and camera (hysteroscope) is used to examine the uterus. The camera sends images to a monitor in the room so that your health care provider can view the inside of your uterus. A hysteroscopy should be done right  after a menstrual period. Tell a health care provider about: Any allergies you have. All medicines you are taking, including vitamins, herbs, eye drops, creams, and over-the-counter medicines. Any problems you or family members have had with anesthetic medicines. Any blood disorders you have. Any surgeries you have had. Any medical conditions you have. Whether you are pregnant or may be pregnant. Whether you have been diagnosed with an STI (sexually transmitted infection) or you think you have an STI. What are the risks? Generally, this is a safe procedure. However, problems may occur, including: Excessive bleeding. Infection. Damage to the uterus or other structures or organs. Allergic reaction to medicines or fluids that are used in the procedure. What happens before the procedure? Staying hydrated Follow instructions from your health care provider about hydration, which may include: Up to 2 hours before the procedure - you may continue to drink clear liquids, such as water, clear fruit juice, black coffee, and plain tea. Eating and drinking restrictions Follow instructions from your health care provider about eating and drinking, which may include: 8 hours before the procedure - stop eating solid foods and drink clear liquids only. 2 hours before the procedure - stop drinking clear liquids. Medicines Ask your health care provider about: Changing or stopping your regular medicines. This is especially important if you are taking diabetes medicines or blood thinners. Taking medicines such as aspirin and ibuprofen. These medicines can thin your blood. Do not take these medicines unless your health care provider tells you to take them. Taking over-the-counter medicines, vitamins, herbs, and supplements. Medicine may be placed in your cervix the day before the procedure. This medicine causes the cervix to open (dilate). The larger opening makes it easier for the hysteroscope to be inserted  into the uterus during the procedure. General instructions Ask your health care provider: What steps will be taken to help prevent infection. These steps may include: Washing skin with a germ-killing soap. Taking antibiotic medicine. Do not use any products that contain nicotine or tobacco for at least 4 weeks before the procedure. These products include cigarettes, chewing tobacco, and vaping devices, such as e-cigarettes. If you need help quitting, ask your health care provider. Plan to have a responsible adult take you home from the hospital or clinic. Plan to have a responsible adult care for you for the time you are told after you leave the hospital or clinic. This is important. Empty your bladder before the procedure begins. What happens during the procedure? An IV will be inserted into one of your veins. You may be given: A medicine to help you relax (sedative). A medicine that numbs the area around the cervix (local anesthetic). A medicine to make you fall asleep (general anesthetic). A hysteroscope will be inserted through your vagina and into your uterus. Air or fluid will be used to enlarge your uterus to allow your health care provider to see it better. The amount of fluid used will be carefully checked throughout the procedure. In some cases, tissue may be gently scraped from inside the uterus and sent to a lab for testing (biopsy). The procedure may vary among health   care providers and hospitals. What can I expect after the procedure? Your blood pressure, heart rate, breathing rate, and blood oxygen level will be monitored until you leave the hospital or clinic. You may have cramps. You may be given medicines for this. You may have bleeding, which may vary from light spotting to menstrual-like bleeding. This is normal. If you had a biopsy, it is up to you to get the results. Ask your health care provider, or the department that is doing the procedure, when your results will be  ready. Follow these instructions at home: Activity Rest as told by your health care provider. Return to your normal activities as told by your health care provider. Ask your health care provider what activities are safe for you. If you were given a sedative during the procedure, it can affect you for several hours. Do not drive or operate machinery until your health care provider says that it is safe. Medicines Do not take aspirin or other NSAIDs during recovery, as told by your healthcare provider. It can increase the risk of bleeding. Ask your health care provider if the medicine prescribed to you: Requires you to avoid driving or using machinery. Can cause constipation. You may need to take these actions to prevent or treat constipation: Drink enough fluid to keep your urine pale yellow. Take over-the-counter or prescription medicines. Eat foods that are high in fiber, such as beans, whole grains, and fresh fruits and vegetables. Limit foods that are high in fat and processed sugars, such as fried or sweet foods. General instructions Do not douche, use tampons, or have sex for 2 weeks after the procedure, or until your health care provider approves. Do not take baths, swim, or use a hot tub until your health care provider approves. Take showers instead of baths for 2 weeks, or for as long as told by your health care provider. Keep all follow-up visits. This is important. Contact a health care provider if: You feel dizzy or lightheaded. You feel nauseous. You have abnormal vaginal discharge. You have a rash. You have pain that does not get better with medicine. You have chills. Get help right away if: You have bleeding that is heavier than a normal menstrual period. You have a fever. You have pain or cramps that get worse. You develop new abdominal pain. You faint. You have pain in your shoulder. You are short of breath. Summary Hysteroscopy is a procedure that is used to look  inside a woman's womb (uterus). After the procedure, you may have bleeding, which varies from light spotting to menstrual-like bleeding. This is normal. You may also have cramps. Do not douche, use tampons, or have sex for 2 weeks after the procedure, or until your health care provider approves. Plan to have a responsible adult take you home from the hospital or clinic. This information is not intended to replace advice given to you by your health care provider. Make sure you discuss any questions you have with your health care provider. Document Revised: 11/17/2020 Document Reviewed: 08/08/2019 [employer] Patient Education  2023 [employer]  

## 2021-06-11 NOTE — Progress Notes (Unsigned)
ACUTE VISIT Chief Complaint  Patient presents with   Anxiety    Increased anxiety, would like a short-term medication. She would also like her iron checked due to increase in bleeding from menstrual cycle. Has a h/o of iron deficiency anemia.   HPI: Ana Padilla is a 36 y.o. female with hx of sickle cell trait and pituitary adenoma with hyperprolactinemia here today complaining of 3 weeks of chest discomfort and worsening anxiety.  Left upper chest tightness like sensation 4 days ago, right under left clavicle, intermittent. One time upper sternal soreness x 1 day It is not exacerbated by exertion but with acute anxiety. She has had chest pain in the past, occasionally. No associated symptoms.  Chest Pain  This is a new problem. The current episode started in the past 7 days. The problem has been waxing and waning. The pain is mild. The quality of the pain is described as tightness. The pain does not radiate. Associated symptoms include malaise/fatigue. Pertinent negatives include no abdominal pain, claudication, cough, exertional chest pressure, fever, headaches, hemoptysis, irregular heartbeat, leg pain, lower extremity edema, nausea, near-syncope, numbness, orthopnea, palpitations, PND, shortness of breath, sputum production, syncope, vomiting or weakness. The pain is aggravated by emotional upset. She has tried NSAIDs for the symptoms. The treatment provided mild relief. Risk factors include obesity and sedentary lifestyle.  Pertinent negatives for past medical history include no seizures.  She has taken Ibuprofen,Tylenol,and Aspirin..  Sleeping about 5-6 hours. She is having difficulty falling and staying asleep, sometimes she has difficulty falling asleep.She has tried different medications for insomnia including Trazodone,Belsomra,and Hydroxyzine among some. "So many thoughts "at the same time when trying to sleep.  For anxiety and depression she has tried Wellbutrin,  Celexa,and sertraline among some. She did not like she felt when she was taking medication, felt like "cloudy." She is looking into "holistic treatment."  FHx  negative for bipolar disorder She is seeing therapist weekly. She is also being evaluated for ADHD.     06/12/2021    7:05 AM 12/10/2020    5:33 PM 11/13/2020   11:41 AM 03/19/2020   11:29 AM 02/19/2019   11:19 AM  Depression screen PHQ 2/9  Decreased Interest '1 3 1 1 1  '$ Down, Depressed, Hopeless '2 3 2 3 '$ 0  PHQ - 2 Score '3 6 3 4 1  '$ Altered sleeping '3 3 3 3 1  '$ Tired, decreased energy '3 3 3 3 2  '$ Change in appetite '2 3 3 2 1  '$ Feeling bad or failure about yourself  0 0 1 1 0  Trouble concentrating 2 2 0 2 1  Moving slowly or fidgety/restless 0 0 0 0 0  Suicidal thoughts 0 0 0 0 0  PHQ-9 Score '13 17 13 15 6  '$ Difficult doing work/chores Very difficult   Very difficult Somewhat difficult   She would like iron to be checked. She is on iron supplementation. Heavy menses, following with gyn, last visit yesterday.  Review of Systems  Constitutional:  Positive for fatigue and malaise/fatigue. Negative for activity change, appetite change and fever.  HENT:  Negative for mouth sores and nosebleeds.   Respiratory:  Negative for cough, hemoptysis, sputum production, shortness of breath and wheezing.   Cardiovascular:  Positive for chest pain. Negative for palpitations, orthopnea, claudication, leg swelling, syncope, PND and near-syncope.  Gastrointestinal:  Negative for abdominal pain, nausea and vomiting.       Negative for changes in bowel habits.  Genitourinary:  Negative for decreased urine volume and hematuria.  Neurological:  Negative for seizures, syncope, weakness, numbness and headaches.  Psychiatric/Behavioral:  Positive for sleep disturbance. Negative for confusion. The patient is nervous/anxious.   Rest see pertinent positives and negatives per HPI.  Current Outpatient Medications on File Prior to Visit  Medication Sig  Dispense Refill   acetaminophen (TYLENOL) 500 MG tablet Take 1 tablet (500 mg total) by mouth every 6 (six) hours as needed for mild pain or moderate pain. 30 tablet 0   BIOTIN PO Take by mouth.     cabergoline (DOSTINEX) 0.5 MG tablet Take 0.5 tablets (0.25 mg total) by mouth 2 (two) times a week. (Patient taking differently: Take 0.25 mg by mouth 2 (two) times a week. Mon and Thurs) 19 tablet 2   cetirizine (ZYRTEC) 10 MG tablet Take 10 mg by mouth daily.     colestipol (COLESTID) 1 g tablet Take 1 tablet (1 g total) by mouth 2 (two) times daily. 180 tablet 4   Cyanocobalamin (B-12 PO) Take by mouth.     Iron-Vitamins (S.S.S. TONIC PO) Take 44 mLs by mouth.     KLS ALLER-FLO 50 MCG/ACT nasal spray Place into both nostrils.     medroxyPROGESTERone (PROVERA) 10 MG tablet Take 1 tablet (10 mg total) by mouth daily. 10 tablet 0   methocarbamol (ROBAXIN) 500 MG tablet Take 1 tablet (500 mg total) by mouth every 8 (eight) hours as needed for muscle spasms. (Patient taking differently: Take 500 mg by mouth as needed for muscle spasms.) 20 tablet 0   naproxen (NAPROSYN) 500 MG tablet Take 1 tablet (500 mg total) by mouth 2 (two) times daily. (Patient taking differently: Take 500 mg by mouth as needed.) 30 tablet 0   olopatadine (PATADAY) 0.1 % ophthalmic solution 1 drop 2 (two) times daily.     Prenatal Vit-Fe Fumarate-FA (MULTIVITAMIN-PRENATAL) 27-0.8 MG TABS tablet Take 1 tablet by mouth daily at 12 noon.     No current facility-administered medications on file prior to visit.   Past Medical History:  Diagnosis Date   Adenoma of pituitary ([place]) 08/2014   Anemia    Elevated ferritin level    Heel spur, left    IBS (irritable bowel syndrome)    Low iron    Morbid obesity (HCC)    Seasonal allergies    Sickle cell trait (HCC)    Vitamin D deficiency    Allergies  Allergen Reactions   Food Other (See Comments)    Seasonal Allergies & fresh fruit---itchy mouth&nose    Pollinex-T [Modified  Tree Tyrosine Adsorbate]     Itchy nose and eyes, ears, runny nose,    Social History   Socioeconomic History   Marital status: Married    Spouse name: Not on file   Number of children: 0   Years of education: Not on file   Highest education level: Not on file  Occupational History   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  Tobacco Use   Smoking status: Never   Smokeless tobacco: Never  Vaping Use   Vaping Use: Never used  Substance and Sexual Activity   Alcohol use: Not Currently   Drug use: No   Sexual activity: Yes    Partners: Male    Birth control/protection: None  Other Topics Concern   Not on file  Social History Narrative   Not on file   Social Determinants of Health   Financial Resource Strain: Not on file  Food Insecurity: Not on  file  Transportation Needs: Not on file  Physical Activity: Unknown (11/10/2020)   Exercise Vital Sign    Days of Exercise per Week: 0 days    Minutes of Exercise per Session: Not on file  Stress: Stress Concern Present (11/10/2020)   [place]    Feeling of Stress : Rather much  Social Connections: Unknown (11/10/2020)   Social Connection and Isolation Panel [NHANES]    Frequency of Communication with Friends and Family: Not on file    Frequency of Social Gatherings with Friends and Family: Not on file    Attends Religious Services: Not on file    Active Member of Clubs or Organizations: No    Attends Archivist Meetings: Not on file    Marital Status: Not on file   Vitals:   06/12/21 0651  BP: 118/80  Pulse: 78  Resp: 16  Temp: 99 F (37.2 C)  SpO2: 99%   Body mass index is 57.17 kg/m.  Physical Exam Vitals and nursing note reviewed.  Constitutional:      General: She is not in acute distress.    Appearance: She is well-developed.  HENT:     Head: Normocephalic and atraumatic.     Mouth/Throat:     Mouth: Mucous membranes are moist.  Eyes:      Conjunctiva/sclera: Conjunctivae normal.  Cardiovascular:     Rate and Rhythm: Normal rate and regular rhythm.     Heart sounds: No murmur heard. Pulmonary:     Effort: Pulmonary effort is normal. No respiratory distress.     Breath sounds: Normal breath sounds.  Chest:     Chest wall: No tenderness.  Abdominal:     Palpations: Abdomen is soft. There is no hepatomegaly or mass.     Tenderness: There is no abdominal tenderness.  Musculoskeletal:     Right lower leg: No edema.     Left lower leg: No edema.  Lymphadenopathy:     Cervical: No cervical adenopathy.  Skin:    General: Skin is warm.     Findings: No erythema or rash.  Neurological:     General: No focal deficit present.     Mental Status: She is alert and oriented to person, place, and time.     Cranial Nerves: No cranial nerve deficit.     Gait: Gait normal.  Psychiatric:     Comments: Well groomed, good eye contact.   ASSESSMENT AND PLAN:  Ms.Akia was seen today for anxiety.  Diagnoses and all orders for this visit: Orders Placed This Encounter  Procedures   CBC   Iron   Lab Results  Component Value Date   WBC 6.9 06/12/2021   HGB 10.0 (L) 06/12/2021   HCT 30.4 (L) 06/12/2021   MCV 68.1 Repeated and verified X2. (L) 06/12/2021   PLT 365.0 06/12/2021   Other chest pain We discussed possible etiologies. ? Musculoskeletal pain, GERD. I do not think CXR or EKG are needed at this time. Instructed about warning signs.  Iron deficiency anemia due to chronic blood loss Continue iron supplementation. DUB (menometrorrhagia) following with gyn. Further recommendations according to CBC and iron results.  GAD (generalized anxiety disorder) She has tried different SSRI's in the pst, did not help. She agrees with trying Effexor 37.5 mg daily. Some side effects discussed. Continue weekly CBT. F/U with PCP in 5 weeks,before if needed.  -     venlafaxine XR (EFFEXOR XR) 37.5 MG  24 hr capsule; Take 1  capsule (37.5 mg total) by mouth daily with breakfast.  Moderate recurrent major depression (HCC) Effexor XR 37.5 mg daily started today. Following with psychotherapist. Instructed about warning signs. F/U in 5 weeks, before if needed.  -     venlafaxine XR (EFFEXOR XR) 37.5 MG 24 hr capsule; Take 1 capsule (37.5 mg total) by mouth daily with breakfast.  Return in about 5 weeks (around 07/17/2021) for depression and anxiety with PCP.  Maryetta Shafer G. [place], MD  [facility]. [place] office.

## 2021-06-12 ENCOUNTER — Encounter: Payer: Self-pay | Admitting: Family Medicine

## 2021-06-12 ENCOUNTER — Ambulatory Visit: Payer: 59 | Admitting: Family Medicine

## 2021-06-12 VITALS — BP 118/80 | HR 78 | Temp 99.0°F | Resp 16 | Ht 67.0 in | Wt 365.0 lb

## 2021-06-12 DIAGNOSIS — F411 Generalized anxiety disorder: Secondary | ICD-10-CM

## 2021-06-12 DIAGNOSIS — F331 Major depressive disorder, recurrent, moderate: Secondary | ICD-10-CM

## 2021-06-12 DIAGNOSIS — D500 Iron deficiency anemia secondary to blood loss (chronic): Secondary | ICD-10-CM

## 2021-06-12 DIAGNOSIS — R0789 Other chest pain: Secondary | ICD-10-CM

## 2021-06-12 LAB — CBC
HCT: 30.4 % — ABNORMAL LOW (ref 36.0–46.0)
Hemoglobin: 10 g/dL — ABNORMAL LOW (ref 12.0–15.0)
MCHC: 32.9 g/dL (ref 30.0–36.0)
MCV: 68.1 fl — ABNORMAL LOW (ref 78.0–100.0)
Platelets: 365 10*3/uL (ref 150.0–400.0)
RBC: 4.45 Mil/uL (ref 3.87–5.11)
RDW: 19.2 % — ABNORMAL HIGH (ref 11.5–15.5)
WBC: 6.9 10*3/uL (ref 4.0–10.5)

## 2021-06-12 LAB — IRON: Iron: 41 ug/dL — ABNORMAL LOW (ref 42–145)

## 2021-06-12 MED ORDER — VENLAFAXINE HCL ER 37.5 MG PO CP24: 37.5000 mg | ORAL_CAPSULE | Freq: Every day | ORAL | 1 refills | Status: DC

## 2021-06-12 NOTE — Patient Instructions (Addendum)
A few things to remember from today's visit:  GAD (generalized anxiety disorder)  Moderate recurrent major depression (HCC)  Iron deficiency anemia due to chronic blood loss - Plan: CBC, Iron  Other chest pain  If you need refills please call your pharmacy. Do not use My Chart to request refills or for acute issues that need immediate attention.   Today we started Effexor, this type of medications can increase suicidal risk. This is more prevalent among children,adolecents, and young adults with major depression or other psychiatric disorders. It can also make depression worse. Most common side effects are gastrointestinal, self limited after a few weeks: diarrhea, nausea, constipation  Or diarrhea among some.  In general it is well tolerated. We will follow closely.  Please be sure medication list is accurate. If a new problem present, please set up appointment sooner than planned today.   Chest pain does not seem to be caused by a serious problem, could be anxiety or muscular.

## 2021-06-13 ENCOUNTER — Telehealth: Payer: Self-pay | Admitting: Obstetrics and Gynecology

## 2021-06-13 ENCOUNTER — Encounter: Payer: Self-pay | Admitting: Obstetrics and Gynecology

## 2021-06-13 NOTE — Telephone Encounter (Signed)
Please precert and schedule hysteroscopy with Myosure resection of endometrium, dilation and curettage at [facility].   Diagnoses:  menorrhagia with irregular menses, thickened endometrium.  Time needed 45 minutes.   Patient unable to tolerate endometrial biopsy in an office setting.

## 2021-06-15 ENCOUNTER — Telehealth: Payer: Self-pay

## 2021-06-15 NOTE — Telephone Encounter (Signed)
BMI 57.17, WT 365. Will need to be scheduled in MAIN OR.   Spoke with patient. Reviewed surgery dates. Patient request to proceed with surgery on 07/13/21.  Advised patient I will forward to business office for return call. I will return call once surgery date and time confirmed. Patient verbalizes understanding and is agreeable.   Surgery request sent.   Routing to [employer]

## 2021-06-15 NOTE — Telephone Encounter (Signed)
Spoke with patient and informed her of Dr. Elza Rafter recommendations. She said Rx has been filled and she will pick it up today and take as directed.

## 2021-06-15 NOTE — Telephone Encounter (Signed)
Patient called stating that at 06/11/21 visit Dr. Quincy Simmonds prescribed generic Provera 10 mg for her to take x 10 days. She said the pharmacy only filled it yesterday.  She said her bleeding stopped 3 days ago.  She is questioning if she still needs to take the Provera prior to [facility] Hysteroscopy?

## 2021-06-15 NOTE — Telephone Encounter (Signed)
Please have Ana Padilla complete the full 10 day course of the Provera.  I am please that the bleeding appears to have stopped.  Please remind her that she will have another period after she completes the course of the Provera.  We want her to shed some of the lining and then proceed with the hysteroscopy.

## 2021-06-17 NOTE — Telephone Encounter (Signed)
Call to patient to review Pre-op instructions. Patient unable to talk, will return call at a later time.

## 2021-06-17 NOTE — Telephone Encounter (Signed)
Spoke with patient. Surgery date request confirmed.  Advised surgery is scheduled for 07/13/21, [facility] at 0730.  Surgery instruction sheet and hospital brochure reviewed, printed copy will be mailed.  Patient verbalizes understanding and is agreeable.   Routing to [employer]. Encounter may be closed after benefits reviewed.

## 2021-06-23 NOTE — Progress Notes (Signed)
36 y.o. G0P0000 Married [place] American female here for annual exam and Pre-op.    Patient has recurrent irregular and heavy menses.   Pelvic US on 06/11/21 showed:  Uterus 9.3 x 5.67 x 4.39 cm.  No myometrial masses.  EMS 19.69 mm.   Several feeder vessels. Left ovary 3.84 x 2.22 x. 1.76 cm.  Normal follicles. Right ovary 3.81 x 2.31 x 2.90 cm.  Normal follicles.  No adnexal masses.  No free fluid.   Patient was unable to tolerate an office endometrial biopsy.   She was given a course of Provera 10 mg x 10 days.  Today is her last day of Provera.  Her bleeding stopped right away after she started the medication.   Patient has chronic anemia and has seen hematology.  Labs 06/12/21: Hgb 10.0. Iron 41. Taking SSS tonic with iron 100 mg iron.    She is considering a Mirena IUD for the future.  Had Mirena IUD in the past x 2.  First IUD was malpositioned, and it was immediately removed.  Second IUD placement was painful and she had irregular cycles following this, and it was removed.   Sees Dr. [name] for monitoring her pituitary adenoma.  She is on Dostinex.  Last prolactin was done 08/09/19.  She is due for office follow up with her.   PCP:  Grier Mitts, MD   Patient's last menstrual period was 05/13/2021.     Period Pattern: (!) Irregular     Sexually active: Yes.    The current method of family planning is none.    Exercising: No.   Dancing sometimes Smoker:  no  Health Maintenance: Pap:   04-28-17 Neg:Neg HR HPV, ~2017 normal per patient History of abnormal Pap:  no MMG:  n/a Colonoscopy:  n/a BMD:   n/a  Result  n/a TDaP:  07-01-17 Gardasil:  Only 1 at age 36 HIV: 04-28-17 NR Hep C: 04-28-17 Neg Screening Labs:  PCP and hematology   reports that she has never smoked. She has never used smokeless tobacco. She reports that she does not currently use alcohol. She reports that she does not use drugs.  Past Medical History:  Diagnosis Date   Adenoma of pituitary  ([address]) 08/2014   Anemia    Elevated ferritin level    G6PD deficiency 01/04/2021   Heel spur, left    IBS (irritable bowel syndrome)    Low iron    Morbid obesity (HCC)    Seasonal allergies    Sickle cell trait (HCC)    Vitamin D deficiency     Past Surgical History:  Procedure Laterality Date   ACHILLES TENDON LENGTHENING Left    BLEPHAROPLASTY Right    lower lid x 3   CHOLECYSTECTOMY  2016   ELECTROLYSIS OF MISDIRECTED LASHES Right 08/04/2017   Procedure: ELECTROLYSIS OF MISDIRECTED LASHES RIGHT EYE;  Surgeon: Clista Bernhardt, MD;  Location: [place];  Service: Ophthalmology;  Laterality: Right;   ENTROPIAN REPAIR Right 08/04/2017   Procedure: REPAIR OF ENTROPION WITH TARSAL STRIP RIGHT EYE;  Surgeon: Clista Bernhardt, MD;  Location: [place];  Service: Ophthalmology;  Laterality: Right;   HEEL SPUR RESECTION Left    HYSTEROSCOPY WITH D & C N/A 06/27/2017   Procedure: DILATATION & CURETTAGE/HYSTEROSCOPY;  Surgeon: Nunzio Cobbs, MD;  Location: [facility];  Service: Gynecology;  Laterality: N/A;   iud placement     been removed   TENDON REPAIR Left 05/21/2020   Procedure:  TENOLYSIS OF LEFT FOOT;  Surgeon: Evelina Bucy, DPM;  Location: [facility];  Service: Podiatry;  Laterality: Left;    Current Outpatient Medications  Medication Sig Dispense Refill   acetaminophen (TYLENOL) 500 MG tablet Take 1 tablet (500 mg total) by mouth every 6 (six) hours as needed for mild pain or moderate pain. 30 tablet 0   BIOTIN PO Take by mouth.     cabergoline (DOSTINEX) 0.5 MG tablet Take 0.5 tablets (0.25 mg total) by mouth 2 (two) times a week. (Patient taking differently: Take 0.25 mg by mouth 2 (two) times a week. Mon and Thurs) 19 tablet 2   cetirizine (ZYRTEC) 10 MG tablet Take 10 mg by mouth daily.     colestipol (COLESTID) 1 g tablet Take 1 tablet (1 g total) by mouth 2 (two) times daily. 180 tablet 4   Cyanocobalamin (B-12 PO) Take by mouth.     Iron-Vitamins (S.S.S. TONIC PO) Take 44  mLs by mouth.     KLS ALLER-FLO 50 MCG/ACT nasal spray Place into both nostrils.     medroxyPROGESTERone (PROVERA) 10 MG tablet Take 1 tablet (10 mg total) by mouth daily. 10 tablet 0   methocarbamol (ROBAXIN) 500 MG tablet Take 1 tablet (500 mg total) by mouth every 8 (eight) hours as needed for muscle spasms. (Patient taking differently: Take 500 mg by mouth as needed for muscle spasms.) 20 tablet 0   naproxen (NAPROSYN) 500 MG tablet Take 1 tablet (500 mg total) by mouth 2 (two) times daily. (Patient taking differently: Take 500 mg by mouth as needed.) 30 tablet 0   olopatadine (PATADAY) 0.1 % ophthalmic solution 1 drop 2 (two) times daily.     Prenatal Vit-Fe Fumarate-FA (MULTIVITAMIN-PRENATAL) 27-0.8 MG TABS tablet Take 1 tablet by mouth daily at 12 noon.     venlafaxine XR (EFFEXOR XR) 37.5 MG 24 hr capsule Take 1 capsule (37.5 mg total) by mouth daily with breakfast. (Patient not taking: Reported on 06/25/2021) 30 capsule 1   No current facility-administered medications for this visit.    Family History  Adopted: Yes  Problem Relation Age of Onset   Sickle cell trait Mother    Heart disease Father    Obesity Father    Colon cancer Neg Hx    Esophageal cancer Neg Hx    Stomach cancer Neg Hx    Sleep apnea Neg Hx     Review of Systems  All other systems reviewed and are negative.   Exam:   BP 118/82   Pulse 75   Ht '5\' 8"'$  (1.727 m)   Wt (!) 366 lb (166 kg)   LMP 05/13/2021   SpO2 96%   BMI 55.65 kg/m     General appearance: alert, cooperative and appears stated age Head: normocephalic, without obvious abnormality, atraumatic Neck: no adenopathy, supple, symmetrical, trachea midline and thyroid normal to inspection and palpation Lungs: clear to auscultation bilaterally Breasts: normal appearance, no masses or tenderness, No nipple retraction or dimpling, No nipple discharge or bleeding, No axillary adenopathy Heart: regular rate and rhythm Abdomen: soft, non-tender; no  masses, no organomegaly Extremities: extremities normal, atraumatic, no cyanosis or edema Skin: skin color, texture, turgor normal. No rashes or lesions Lymph nodes: cervical, supraclavicular, and axillary nodes normal. Neurologic: grossly normal  Pelvic: External genitalia:  no lesions              No abnormal inguinal nodes palpated.  Urethra:  normal appearing urethra with no masses, tenderness or lesions              Bartholins and Skenes: normal                 Vagina: normal appearing vagina with normal color and discharge, no lesions              Cervix: no lesions              Pap taken: no Bimanual Exam:  Uterus:  normal size, contour, position, consistency, mobility, non-tender              Adnexa: no mass, fullness, tenderness               Chaperone was present for exam:  Maudie Mercury, CMA  Assessment:   Well woman visit with gynecologic exam. Endometrial thickening.  Recurrent abnormal uterine bleeding.  Inability to tolerate office endometrial sampling.  BM 57.17.  Hx pituitary adenoma.  On Dostinex. Chronic anemia.   Plan: Mammogram screening age 69.  Self breast awareness reviewed. Pap and HR HPV 2024.  Guidelines for Calcium, Vitamin D, regular exercise program including cardiovascular and weight bearing exercise. She will increase her iron to twice daily.   Prolactin level.  Continue Provera 10 mg daily. #30, RF none.  Proceed with hysteroscopy, possible Myosure resection of endometrium, dilation and curettage.  Risks, benefits, and alternatives reviewed with patient who wishes to proceed. Consider Mirena IUD after surgery is complete and results are back.  Follow up annually and prn.   After visit summary provided.

## 2021-06-24 NOTE — Telephone Encounter (Signed)
Call to patient. Per DPR, OK to leave message on voicemail.   Left voicemail requesting a return call to review benefits for Scheduled Surgery with Brook Silva, MD, FACOG.  

## 2021-06-24 NOTE — Telephone Encounter (Signed)
Spoke with patient regarding surgery benefits. Patient acknowledges understanding of information presented. Patient is aware that benefits presented are professional benefits only. Patient is aware the hospital will call with facility benefits. See account note.This insurance quote is not a guarantee of benefits or claims & is subject to change upon insurance company review. Message sent to Billing for payment

## 2021-06-25 ENCOUNTER — Ambulatory Visit (INDEPENDENT_AMBULATORY_CARE_PROVIDER_SITE_OTHER): Payer: 59 | Admitting: Obstetrics and Gynecology

## 2021-06-25 ENCOUNTER — Encounter: Payer: Self-pay | Admitting: Obstetrics and Gynecology

## 2021-06-25 VITALS — BP 118/82 | HR 75 | Ht 68.0 in | Wt 366.0 lb

## 2021-06-25 DIAGNOSIS — Z01818 Encounter for other preprocedural examination: Secondary | ICD-10-CM | POA: Diagnosis not present

## 2021-06-25 DIAGNOSIS — Z8742 Personal history of other diseases of the female genital tract: Secondary | ICD-10-CM | POA: Diagnosis not present

## 2021-06-25 DIAGNOSIS — R9389 Abnormal findings on diagnostic imaging of other specified body structures: Secondary | ICD-10-CM | POA: Diagnosis not present

## 2021-06-25 DIAGNOSIS — D638 Anemia in other chronic diseases classified elsewhere: Secondary | ICD-10-CM | POA: Diagnosis not present

## 2021-06-25 DIAGNOSIS — D352 Benign neoplasm of pituitary gland: Secondary | ICD-10-CM

## 2021-06-25 DIAGNOSIS — Z01419 Encounter for gynecological examination (general) (routine) without abnormal findings: Secondary | ICD-10-CM | POA: Diagnosis not present

## 2021-06-25 MED ORDER — MEDROXYPROGESTERONE ACETATE 10 MG PO TABS: 10.0000 mg | ORAL_TABLET | Freq: Every day | ORAL | 0 refills | Status: DC

## 2021-06-25 NOTE — Patient Instructions (Signed)

## 2021-06-26 LAB — PROLACTIN: Prolactin: 5.1 ng/mL

## 2021-07-06 ENCOUNTER — Telehealth: Payer: Self-pay

## 2021-07-06 NOTE — Telephone Encounter (Signed)
Spoke with patient and informed her. °

## 2021-07-06 NOTE — Telephone Encounter (Signed)
Have her increase her provera to BID until the bleeding stops, then go back to 1 po qd.

## 2021-07-06 NOTE — Telephone Encounter (Signed)
Scheduled for D&C Hysteroscopy on 07/13/2021 with Dr. Quincy Simmonds.  Patient has been taking Provera 10 mg daily but said she missed one because pharmacy delay in filling. She started to have some bleeding and heavy cramps and the last fews days a very heavy period.  She asked if Dr. Quincy Simmonds wants her to make any adjustments to the medication to try to stop or slow the bleeding prior to surgery?

## 2021-07-08 MED ORDER — NORETHINDRONE ACETATE 5 MG PO TABS: 5.0000 mg | ORAL_TABLET | Freq: Two times a day (BID) | ORAL | 0 refills | Status: DC

## 2021-07-08 NOTE — Telephone Encounter (Signed)
Spoke with patient and informed her. New Rx sent.

## 2021-07-08 NOTE — Telephone Encounter (Signed)
Please have patient stop the Provera and start Aygestin 5 mg po bid.  #20, RF none.  The Aygestin may help better.   We will proceed with her surgery even is she is still bleeding.

## 2021-07-08 NOTE — Addendum Note (Signed)
Addended by: Ramond Craver on: 07/08/2021 12:18 PM   Modules accepted: Orders

## 2021-07-08 NOTE — Telephone Encounter (Signed)
Patient reports bleeding continues on Provera 10 mg BID.  She said right after she takes it flow decreases to a normal period. At bedtime she felt strong cramps and took Tylenol and bleeding seemed to increase right before bed and through the night. This morning it is light. Better than any time yesterday.  She is concerned that Dr. Quincy Simmonds will not be able to perform her surgery on 07/13/21 if she is bleeding.

## 2021-07-10 ENCOUNTER — Encounter (HOSPITAL_COMMUNITY): Payer: Self-pay | Admitting: Obstetrics and Gynecology

## 2021-07-10 ENCOUNTER — Other Ambulatory Visit: Payer: Self-pay

## 2021-07-10 NOTE — Progress Notes (Signed)
PCP - Dr Grier Mitts Cardiologist - n/a Oncology - Cira Rue, NP  Chest x-ray - n/a EKG - 12/10/20 Stress Test - n/a ECHO - n/a Cardiac Cath - n/a  ICD Pacemaker/Loop - n/a  Sleep Study -  n/a CPAP - none  ERAS: Clear liquids til 4:30 AM on DOS.  Anesthesia review: Yes  STOP now taking any Aspirin (unless otherwise instructed by your surgeon), Aleve, Naproxen, Ibuprofen, Motrin, Advil, Goody's, BC's, all herbal medications, fish oil, and all vitamins.   Coronavirus Screening Do you have any of the following symptoms:  Cough yes/no: No Fever (>100.68F)  yes/no: No Runny nose yes/no: No Sore throat yes/no: No Difficulty breathing/shortness of breath  yes/no: No  Have you traveled in the last 14 days and where? yes/no: No  Patient verbalized understanding of instructions that were given via phone.

## 2021-07-10 NOTE — Anesthesia Preprocedure Evaluation (Addendum)
Anesthesia Evaluation Anesthesia Physical Anesthesia Plan  ASA:   Anesthesia Plan:    Post-op Pain Management:    Induction:   PONV Risk Score and Plan:   Airway Management Planned:   Additional Equipment:   Intra-op Plan:   Post-operative Plan:   Informed Consent:   Plan Discussed with:   Anesthesia Plan Comments: (PAT note written 07/10/2021 by Myra Gianotti, PA-C. History of G6PD deficiency. )       Anesthesia Quick Evaluation

## 2021-07-10 NOTE — Progress Notes (Signed)
Anesthesia Chart Review: Ana Padilla  Case: [id number] Date/Time: 07/13/21 0715   Procedure: DILATATION & CURETTAGE/HYSTEROSCOPY WITH MYOSURE   Anesthesia type: Choice   Pre-op diagnosis: menorrhagia with irregular menses, thickened endometrium   Location: [facility] OR ROOM 01 / [place] [facility]   Surgeons: Nunzio Cobbs, MD       DISCUSSION: Patient is a 36 year old female scheduled for the above procedure.  History includes never smoker, pituitary adenoma (on Dostinex), sickle cell trait, anemia, IBS, G6PD deficiency, morbid obesity.  She is a same day work-up. Anesthesia team to evaluate on the day of surgery.    VS: Ht '5\' 7"'$  (1.702 m)   Wt (!) 165.6 kg   LMP 07/03/2021 (Approximate)   BMI 57.17 kg/m  BP Readings from Last 3 Encounters:  06/25/21 118/82  06/12/21 118/80  06/11/21 126/84   Pulse Readings from Last 3 Encounters:  06/25/21 75  06/12/21 78  05/13/21 82     PROVIDERS: [name], MD is PCP  Scarlette Shorts, MD is GI Star Age, MD is neurologist. Seen on 05/13/21. Future sleep study discussed, but patient wanted to defer for now while getting evaluation for ADHD.  - Last endocrinology visit seen 09/17/19 with Philemon Kingdom, MD. At 06/25/21 visit with Dr. Judeth Horn, she notes, patient is on Dostinex with last prolactin 08/09/19 and due to endocrinology follow-up.    LABS: For day of surgery as indicated.  Last lab results include: Lab Results  Component Value Date   WBC 6.9 06/12/2021   HGB 10.0 (L) 06/12/2021   HCT 30.4 (L) 06/12/2021   PLT 365.0 06/12/2021   GLUCOSE 98 03/19/2021   ALT 19 03/19/2021   AST 26 03/19/2021   NA 140 03/19/2021   K 3.9 03/19/2021   CL 104 03/19/2021   CREATININE 0.72 03/19/2021   BUN 8 03/19/2021   CO2 29 03/19/2021   HGBA1C 5.8 12/10/2020     IMAGES: Pelvic US 06/11/21: Uterus 9.3 x 5.67 x 4.39 cm.  No myometrial masses.  EMS 19.69 mm.   Several feeder vessels. Left ovary 3.84 x 2.22 x. 1.76 cm.  Normal  follicles. Right ovary 3.81 x 2.31 x 2.90 cm.  Normal follicles.  No adnexal masses.  No free fluid.  Impression:  Thickened endometrium with feeder vessels suspicious for endometrial polyp formation or other etiology.  MRI Brain 07/17/18: IMPRESSION: Right-sided pituitary adenoma with cavernous sinus extension measuring 8 mm in diameter.   EKG: 12/10/2020: Normal sinus rhythm Nonspecific T wave abnormality   CV: N/A  Past Medical History:  Diagnosis Date   Adenoma of pituitary ([place]) 08/2014   Anemia    Anxiety    Depression    Elevated ferritin level    G6PD deficiency 01/04/2021   Heel spur, left    IBS (irritable bowel syndrome)    Low iron    Morbid obesity (HCC)    Seasonal allergies    Sickle cell trait (HCC)    Vitamin D deficiency     Past Surgical History:  Procedure Laterality Date   ACHILLES TENDON LENGTHENING Left    BLEPHAROPLASTY Right    lower lid x 3   CHOLECYSTECTOMY  2016   ELECTROLYSIS OF MISDIRECTED LASHES Right 08/04/2017   Procedure: ELECTROLYSIS OF MISDIRECTED LASHES RIGHT EYE;  Surgeon: Clista Bernhardt, MD;  Location: [place];  Service: Ophthalmology;  Laterality: Right;   ENTROPIAN REPAIR Right 08/04/2017   Procedure: REPAIR OF ENTROPION WITH TARSAL STRIP RIGHT EYE;  Surgeon:  Clista Bernhardt, MD;  Location: [place] [facility];  Service: Ophthalmology;  Laterality: Right;   HEEL SPUR RESECTION Left    HYSTEROSCOPY WITH D & C N/A 06/27/2017   Procedure: DILATATION & CURETTAGE/HYSTEROSCOPY;  Surgeon: Nunzio Cobbs, MD;  Location: [facility];  Service: Gynecology;  Laterality: N/A;   iud placement     been removed   TENDON REPAIR Left 05/21/2020   Procedure: TENOLYSIS OF LEFT FOOT;  Surgeon: Evelina Bucy, DPM;  Location: [facility];  Service: Podiatry;  Laterality: Left;    MEDICATIONS: No current facility-administered medications for this encounter.    acetaminophen (TYLENOL) 500 MG tablet   cabergoline (DOSTINEX) 0.5 MG tablet   cetirizine  (ZYRTEC) 10 MG tablet   Cholecalciferol (VITAMIN D3) 50 MCG (2000 UT) capsule   colestipol (COLESTID) 1 g tablet   Cyanocobalamin (B-12 PO)   ibuprofen (ADVIL) 200 MG tablet   KLS ALLER-FLO 50 MCG/ACT nasal spray   medroxyPROGESTERone (PROVERA) 10 MG tablet   naproxen (NAPROSYN) 500 MG tablet   OVER THE COUNTER MEDICATION   Prenatal Vit-Fe Fumarate-FA (MULTIVITAMIN-PRENATAL) 27-0.8 MG TABS tablet   Tetrahydrozoline-Zn Sulfate (ALLERGY RELIEF EYE DROPS OP)   methocarbamol (ROBAXIN) 500 MG tablet   norethindrone (AYGESTIN) 5 MG tablet   venlafaxine XR (EFFEXOR XR) 37.5 MG 24 hr capsule    Myra Gianotti, PA-C Surgical Short Stay/Anesthesiology [facility] Phone [phone] [facility] Phone [phone] 07/10/2021 11:23 AM

## 2021-07-12 NOTE — H&P (Signed)
Progress Notes  Nunzio Cobbs, MD at 06/25/2021 11:00 AM  Status: Signed    36 y.o. G0P0000 Married [place] American female here for annual exam and Pre-op.     Patient has recurrent irregular and heavy menses.    Pelvic US on 06/11/21 showed:  Uterus 9.3 x 5.67 x 4.39 cm.  No myometrial masses.  EMS 19.69 mm.   Several feeder vessels. Left ovary 3.84 x 2.22 x. 1.76 cm.  Normal follicles. Right ovary 3.81 x 2.31 x 2.90 cm.  Normal follicles.  No adnexal masses.  No free fluid.    Patient was unable to tolerate an office endometrial biopsy.    She was given a course of Provera 10 mg x 10 days.  Today is her last day of Provera.  Her bleeding stopped right away after she started the medication.    Patient has chronic anemia and has seen hematology.  Labs 06/12/21: Hgb 10.0. Iron 41. Taking SSS tonic with iron 100 mg iron.     She is considering a Mirena IUD for the future.  Had Mirena IUD in the past x 2.  First IUD was malpositioned, and it was immediately removed.  Second IUD placement was painful and she had irregular cycles following this, and it was removed.    Sees Dr. [name] for monitoring her pituitary adenoma.  She is on Dostinex.  Last prolactin was done 08/09/19.  She is due for office follow up with her.    PCP:  Grier Mitts, MD    Patient's last menstrual period was 05/13/2021.     Period Pattern: (!) Irregular     Sexually active: Yes.    The current method of family planning is none.    Exercising: No.   Dancing sometimes Smoker:  no   Health Maintenance: Pap:   04-28-17 Neg:Neg HR HPV, ~2017 normal per patient History of abnormal Pap:  no MMG:  n/a Colonoscopy:  n/a BMD:   n/a  Result  n/a TDaP:  07-01-17 Gardasil:  Only 1 at age 44 HIV: 04-28-17 NR Hep C: 04-28-17 Neg Screening Labs:  PCP and hematology    reports that she has never smoked. She has never used smokeless tobacco. She reports that she does not currently use alcohol. She  reports that she does not use drugs.       Past Medical History:  Diagnosis Date   Adenoma of pituitary ([place]) 08/2014   Anemia     Elevated ferritin level     G6PD deficiency 01/04/2021   Heel spur, left     IBS (irritable bowel syndrome)     Low iron     Morbid obesity (HCC)     Seasonal allergies     Sickle cell trait (HCC)     Vitamin D deficiency             Past Surgical History:  Procedure Laterality Date   ACHILLES TENDON LENGTHENING Left     BLEPHAROPLASTY Right      lower lid x 3   CHOLECYSTECTOMY   2016   ELECTROLYSIS OF MISDIRECTED LASHES Right 08/04/2017    Procedure: ELECTROLYSIS OF MISDIRECTED LASHES RIGHT EYE;  Surgeon: Clista Bernhardt, MD;  Location: [place];  Service: Ophthalmology;  Laterality: Right;   ENTROPIAN REPAIR Right 08/04/2017    Procedure: REPAIR OF ENTROPION WITH TARSAL STRIP RIGHT EYE;  Surgeon: Clista Bernhardt, MD;  Location: [place];  Service: Ophthalmology;  Laterality: Right;  HEEL SPUR RESECTION Left     HYSTEROSCOPY WITH D & C N/A 06/27/2017    Procedure: DILATATION & CURETTAGE/HYSTEROSCOPY;  Surgeon: Nunzio Cobbs, MD;  Location: [facility];  Service: Gynecology;  Laterality: N/A;   iud placement        been removed   TENDON REPAIR Left 05/21/2020    Procedure: TENOLYSIS OF LEFT FOOT;  Surgeon: Evelina Bucy, DPM;  Location: [facility];  Service: Podiatry;  Laterality: Left;            Current Outpatient Medications  Medication Sig Dispense Refill   acetaminophen (TYLENOL) 500 MG tablet Take 1 tablet (500 mg total) by mouth every 6 (six) hours as needed for mild pain or moderate pain. 30 tablet 0   BIOTIN PO Take by mouth.       cabergoline (DOSTINEX) 0.5 MG tablet Take 0.5 tablets (0.25 mg total) by mouth 2 (two) times a week. (Patient taking differently: Take 0.25 mg by mouth 2 (two) times a week. Mon and Thurs) 19 tablet 2   cetirizine (ZYRTEC) 10 MG tablet Take 10 mg by mouth daily.       colestipol (COLESTID) 1 g tablet Take  1 tablet (1 g total) by mouth 2 (two) times daily. 180 tablet 4   Cyanocobalamin (B-12 PO) Take by mouth.       Iron-Vitamins (S.S.S. TONIC PO) Take 44 mLs by mouth.       KLS ALLER-FLO 50 MCG/ACT nasal spray Place into both nostrils.       medroxyPROGESTERone (PROVERA) 10 MG tablet Take 1 tablet (10 mg total) by mouth daily. 10 tablet 0   methocarbamol (ROBAXIN) 500 MG tablet Take 1 tablet (500 mg total) by mouth every 8 (eight) hours as needed for muscle spasms. (Patient taking differently: Take 500 mg by mouth as needed for muscle spasms.) 20 tablet 0   naproxen (NAPROSYN) 500 MG tablet Take 1 tablet (500 mg total) by mouth 2 (two) times daily. (Patient taking differently: Take 500 mg by mouth as needed.) 30 tablet 0   olopatadine (PATADAY) 0.1 % ophthalmic solution 1 drop 2 (two) times daily.       Prenatal Vit-Fe Fumarate-FA (MULTIVITAMIN-PRENATAL) 27-0.8 MG TABS tablet Take 1 tablet by mouth daily at 12 noon.       venlafaxine XR (EFFEXOR XR) 37.5 MG 24 hr capsule Take 1 capsule (37.5 mg total) by mouth daily with breakfast. (Patient not taking: Reported on 06/25/2021) 30 capsule 1    No current facility-administered medications for this visit.           Family History  Adopted: Yes  Problem Relation Age of Onset   Sickle cell trait Mother     Heart disease Father     Obesity Father     Colon cancer Neg Hx     Esophageal cancer Neg Hx     Stomach cancer Neg Hx     Sleep apnea Neg Hx        Review of Systems  All other systems reviewed and are negative.     Exam:   BP 118/82   Pulse 75   Ht '5\' 8"'$  (1.727 m)   Wt (!) 366 lb (166 kg)   LMP 05/13/2021   SpO2 96%   BMI 55.65 kg/m     General appearance: alert, cooperative and appears stated age Head: normocephalic, without obvious abnormality, atraumatic Neck: no adenopathy, supple, symmetrical, trachea midline and thyroid normal to inspection and  palpation Lungs: clear to auscultation bilaterally Breasts: normal  appearance, no masses or tenderness, No nipple retraction or dimpling, No nipple discharge or bleeding, No axillary adenopathy Heart: regular rate and rhythm Abdomen: soft, non-tender; no masses, no organomegaly Extremities: extremities normal, atraumatic, no cyanosis or edema Skin: skin color, texture, turgor normal. No rashes or lesions Lymph nodes: cervical, supraclavicular, and axillary nodes normal. Neurologic: grossly normal   Pelvic: External genitalia:  no lesions              No abnormal inguinal nodes palpated.              Urethra:  normal appearing urethra with no masses, tenderness or lesions              Bartholins and Skenes: normal                 Vagina: normal appearing vagina with normal color and discharge, no lesions              Cervix: no lesions              Pap taken: no Bimanual Exam:  Uterus:  normal size, contour, position, consistency, mobility, non-tender              Adnexa: no mass, fullness, tenderness                Chaperone was present for exam:  Maudie Mercury, CMA   Assessment:    Well woman visit with gynecologic exam. Endometrial thickening.  Recurrent abnormal uterine bleeding.  Inability to tolerate office endometrial sampling.  BM 57.17.  Hx pituitary adenoma.  On Dostinex. Chronic anemia.    Plan: Mammogram screening age 16.  Self breast awareness reviewed. Pap and HR HPV 2024.  Guidelines for Calcium, Vitamin D, regular exercise program including cardiovascular and weight bearing exercise. She will increase her iron to twice daily.   Prolactin level.  Continue Provera 10 mg daily. #30, RF none.  Proceed with hysteroscopy, possible Myosure resection of endometrium, dilation and curettage.  Risks, benefits, and alternatives reviewed with patient who wishes to proceed. Consider Mirena IUD after surgery is complete and results are back.  Follow up annually and prn.    After visit summary provided.

## 2021-07-13 ENCOUNTER — Other Ambulatory Visit: Payer: Self-pay

## 2021-07-13 ENCOUNTER — Ambulatory Visit (HOSPITAL_COMMUNITY): Payer: 59 | Admitting: Vascular Surgery

## 2021-07-13 ENCOUNTER — Encounter (HOSPITAL_COMMUNITY): Payer: Self-pay | Admitting: Obstetrics and Gynecology

## 2021-07-13 ENCOUNTER — Ambulatory Visit (HOSPITAL_COMMUNITY)
Admission: RE | Admit: 2021-07-13 | Discharge: 2021-07-13 | Disposition: A | Discharge status: Home / Self Care | Payer: 59 | Attending: Obstetrics and Gynecology | Admitting: Obstetrics and Gynecology

## 2021-07-13 ENCOUNTER — Encounter (HOSPITAL_COMMUNITY)
Admission: RE | Disposition: A | Discharge status: Home / Self Care | Payer: Self-pay | Source: Home / Self Care | Attending: Obstetrics and Gynecology

## 2021-07-13 ENCOUNTER — Ambulatory Visit (HOSPITAL_BASED_OUTPATIENT_CLINIC_OR_DEPARTMENT_OTHER): Payer: 59 | Admitting: Vascular Surgery

## 2021-07-13 DIAGNOSIS — D550 Anemia due to glucose-6-phosphate dehydrogenase [G6PD] deficiency: Secondary | ICD-10-CM | POA: Diagnosis not present

## 2021-07-13 DIAGNOSIS — Z79899 Other long term (current) drug therapy: Secondary | ICD-10-CM | POA: Insufficient documentation

## 2021-07-13 DIAGNOSIS — R9389 Abnormal findings on diagnostic imaging of other specified body structures: Secondary | ICD-10-CM | POA: Insufficient documentation

## 2021-07-13 DIAGNOSIS — N840 Polyp of corpus uteri: Secondary | ICD-10-CM | POA: Diagnosis not present

## 2021-07-13 DIAGNOSIS — Z6841 Body Mass Index (BMI) 40.0 and over, adult: Secondary | ICD-10-CM | POA: Diagnosis not present

## 2021-07-13 DIAGNOSIS — N939 Abnormal uterine and vaginal bleeding, unspecified: Secondary | ICD-10-CM

## 2021-07-13 DIAGNOSIS — N921 Excessive and frequent menstruation with irregular cycle: Secondary | ICD-10-CM

## 2021-07-13 DIAGNOSIS — D573 Sickle-cell trait: Secondary | ICD-10-CM | POA: Diagnosis not present

## 2021-07-13 DIAGNOSIS — F418 Other specified anxiety disorders: Secondary | ICD-10-CM | POA: Diagnosis not present

## 2021-07-13 DIAGNOSIS — D352 Benign neoplasm of pituitary gland: Secondary | ICD-10-CM | POA: Diagnosis not present

## 2021-07-13 HISTORY — DX: Anxiety disorder, unspecified: F41.9

## 2021-07-13 HISTORY — PX: DILATATION & CURETTAGE/HYSTEROSCOPY WITH MYOSURE: SHX6511

## 2021-07-13 HISTORY — DX: Depression, unspecified: F32.A

## 2021-07-13 LAB — CBC
HCT: 30.2 % — ABNORMAL LOW (ref 36.0–46.0)
Hemoglobin: 9.5 g/dL — ABNORMAL LOW (ref 12.0–15.0)
MCH: 22.6 pg — ABNORMAL LOW (ref 26.0–34.0)
MCHC: 31.5 g/dL (ref 30.0–36.0)
MCV: 71.9 fL — ABNORMAL LOW (ref 80.0–100.0)
Platelets: 434 10*3/uL — ABNORMAL HIGH (ref 150–400)
RBC: 4.2 MIL/uL (ref 3.87–5.11)
RDW: 16.3 % — ABNORMAL HIGH (ref 11.5–15.5)
WBC: 9.3 10*3/uL (ref 4.0–10.5)
nRBC: 0 % (ref 0.0–0.2)

## 2021-07-13 LAB — POCT PREGNANCY, URINE: Preg Test, Ur: NEGATIVE

## 2021-07-13 LAB — GLUCOSE, CAPILLARY: Glucose-Capillary: 97 mg/dL (ref 70–99)

## 2021-07-13 SURGERY — DILATATION & CURETTAGE/HYSTEROSCOPY WITH MYOSURE
Anesthesia: General | Site: Vagina

## 2021-07-13 MED ORDER — AMISULPRIDE (ANTIEMETIC) 5 MG/2ML IV SOLN
10.0000 mg | Freq: Once | INTRAVENOUS | Status: DC | PRN
Start: 2021-07-13 — End: 2021-07-13

## 2021-07-13 MED ORDER — CHLORHEXIDINE GLUCONATE 0.12 % MT SOLN
OROMUCOSAL | Status: AC
  Administered 2021-07-13: 15 mL via OROMUCOSAL
  Filled 2021-07-13: qty 15

## 2021-07-13 MED ORDER — LIDOCAINE HCL 1 % IJ SOLN
INTRAMUSCULAR | Status: AC
  Filled 2021-07-13: qty 20

## 2021-07-13 MED ORDER — KETOROLAC TROMETHAMINE 30 MG/ML IJ SOLN
INTRAMUSCULAR | Status: AC
  Filled 2021-07-13: qty 1

## 2021-07-13 MED ORDER — CHLORHEXIDINE GLUCONATE 0.12 % MT SOLN: 15.0000 mL | Freq: Once | OROMUCOSAL | Status: AC

## 2021-07-13 MED ORDER — KETOROLAC TROMETHAMINE 30 MG/ML IJ SOLN
INTRAMUSCULAR | Status: DC | PRN
  Administered 2021-07-13: 30 mg via INTRAVENOUS

## 2021-07-13 MED ORDER — FENTANYL CITRATE (PF) 100 MCG/2ML IJ SOLN
INTRAMUSCULAR | Status: AC
  Filled 2021-07-13: qty 2

## 2021-07-13 MED ORDER — MIDAZOLAM HCL 2 MG/2ML IJ SOLN
INTRAMUSCULAR | Status: DC | PRN
  Administered 2021-07-13: 2 mg via INTRAVENOUS

## 2021-07-13 MED ORDER — ORAL CARE MOUTH RINSE: 15.0000 mL | Freq: Once | OROMUCOSAL | Status: AC

## 2021-07-13 MED ORDER — ONDANSETRON HCL 4 MG/2ML IJ SOLN
INTRAMUSCULAR | Status: DC | PRN
  Administered 2021-07-13: 4 mg via INTRAVENOUS

## 2021-07-13 MED ORDER — DEXAMETHASONE SODIUM PHOSPHATE 10 MG/ML IJ SOLN
INTRAMUSCULAR | Status: DC | PRN
  Administered 2021-07-13: 10 mg via INTRAVENOUS

## 2021-07-13 MED ORDER — ACETAMINOPHEN 500 MG PO TABS
ORAL_TABLET | ORAL | Status: AC
  Filled 2021-07-13: qty 2

## 2021-07-13 MED ORDER — PROPOFOL 10 MG/ML IV BOLUS
INTRAVENOUS | Status: AC
  Filled 2021-07-13: qty 20

## 2021-07-13 MED ORDER — LIDOCAINE HCL 1 % IJ SOLN
INTRAMUSCULAR | Status: DC | PRN
  Administered 2021-07-13: 10 mL

## 2021-07-13 MED ORDER — PROPOFOL 10 MG/ML IV BOLUS
INTRAVENOUS | Status: DC | PRN
  Administered 2021-07-13 (×2): 50 mg via INTRAVENOUS
  Administered 2021-07-13: 200 mg via INTRAVENOUS

## 2021-07-13 MED ORDER — FENTANYL CITRATE (PF) 250 MCG/5ML IJ SOLN
INTRAMUSCULAR | Status: DC | PRN
Start: 2021-07-13 — End: 2021-07-13
  Administered 2021-07-13: 100 ug via INTRAVENOUS
  Administered 2021-07-13: 50 ug via INTRAVENOUS

## 2021-07-13 MED ORDER — MIDAZOLAM HCL 2 MG/2ML IJ SOLN
INTRAMUSCULAR | Status: AC
  Filled 2021-07-13: qty 2

## 2021-07-13 MED ORDER — FENTANYL CITRATE (PF) 250 MCG/5ML IJ SOLN
INTRAMUSCULAR | Status: AC
  Filled 2021-07-13: qty 5

## 2021-07-13 MED ORDER — LACTATED RINGERS IV SOLN: INTRAVENOUS | Status: DC | PRN

## 2021-07-13 MED ORDER — SODIUM CHLORIDE 0.9 % IR SOLN
Status: DC | PRN
  Administered 2021-07-13: 1000 mL

## 2021-07-13 MED ORDER — IBUPROFEN 800 MG PO TABS: 800.0000 mg | ORAL_TABLET | Freq: Three times a day (TID) | ORAL | 0 refills | Status: DC | PRN

## 2021-07-13 MED ORDER — POVIDONE-IODINE 10 % EX SWAB
2.0000 | Freq: Once | CUTANEOUS | Status: AC
  Administered 2021-07-13: 2 via TOPICAL

## 2021-07-13 MED ORDER — SCOPOLAMINE 1 MG/3DAYS TD PT72
MEDICATED_PATCH | TRANSDERMAL | Status: DC | PRN
  Administered 2021-07-13: 1 via TRANSDERMAL

## 2021-07-13 MED ORDER — FENTANYL CITRATE (PF) 100 MCG/2ML IJ SOLN
25.0000 ug | INTRAMUSCULAR | Status: DC | PRN
  Administered 2021-07-13 (×2): 50 ug via INTRAVENOUS

## 2021-07-13 MED ORDER — LIDOCAINE 2% (20 MG/ML) 5 ML SYRINGE
INTRAMUSCULAR | Status: DC | PRN
  Administered 2021-07-13: 100 mg via INTRAVENOUS

## 2021-07-13 MED ORDER — PROMETHAZINE HCL 25 MG/ML IJ SOLN: 6.2500 mg | INTRAMUSCULAR | Status: DC | PRN

## 2021-07-13 MED ORDER — LACTATED RINGERS IV SOLN: INTRAVENOUS | Status: DC

## 2021-07-13 MED ORDER — ACETAMINOPHEN 500 MG PO TABS
1000.0000 mg | ORAL_TABLET | Freq: Once | ORAL | Status: AC
  Administered 2021-07-13: 1000 mg via ORAL

## 2021-07-13 SURGICAL SUPPLY — 15 items
CANISTER SUCT 3000ML PPV (MISCELLANEOUS) ×2 IMPLANT
CATH ROBINSON RED A/P 16FR (CATHETERS) ×2 IMPLANT
DEVICE MYOSURE LITE (MISCELLANEOUS) IMPLANT
DEVICE MYOSURE REACH (MISCELLANEOUS) IMPLANT
GLOVE BIO SURGEON STRL SZ 6.5 (GLOVE) ×2 IMPLANT
GLOVE SURG UNDER POLY LF SZ7 (GLOVE) ×2 IMPLANT
GOWN STRL REUS W/ TWL LRG LVL3 (GOWN DISPOSABLE) ×2 IMPLANT
GOWN STRL REUS W/TWL LRG LVL3 (GOWN DISPOSABLE) ×2
KIT PROCEDURE FLUENT (KITS) ×2 IMPLANT
KIT TURNOVER KIT B (KITS) ×2 IMPLANT
PACK VAGINAL MINOR WOMEN LF (CUSTOM PROCEDURE TRAY) ×2 IMPLANT
PAD OB MATERNITY 4.3X12.25 (PERSONAL CARE ITEMS) ×2 IMPLANT
SEAL ROD LENS SCOPE MYOSURE (ABLATOR) ×2 IMPLANT
TOWEL GREEN STERILE FF (TOWEL DISPOSABLE) ×4 IMPLANT
UNDERPAD 30X36 HEAVY ABSORB (UNDERPADS AND DIAPERS) ×2 IMPLANT

## 2021-07-13 NOTE — Progress Notes (Signed)
Update to History and Physical  Started Aygestin 5 mg po bid in place of Provera. Had clotting yesterday, no bleeding today. Hgb 9.5. UPT negative.  Patient examined.  OK to proceed with surgery.

## 2021-07-13 NOTE — Transfer of Care (Signed)
Immediate Anesthesia Transfer of Care Note  Patient: Ana Padilla  Procedure(s) Performed: DILATATION & CURETTAGE/HYSTEROSCOPY (Vagina )  Patient Location: PACU  Anesthesia Type:General  Level of Consciousness: drowsy and patient cooperative  Airway & Oxygen Therapy: Patient Spontanous Breathing and Patient connected to face mask oxygen  Post-op Assessment: Report given to RN and Post -op Vital signs reviewed and stable  Post vital signs: Reviewed and stable  Last Vitals:  Vitals Value Taken Time  BP 133/93 07/13/21 8:41 AM  Temp    Pulse 99 07/13/21 8:41 AM  Resp 16 07/13/21 8:41 AM  SpO2 100% 07/13/21 8:41 AM    Last Pain:  Vitals:   07/13/21 0603  TempSrc:   PainSc: 0-No pain         Complications: No notable events documented.

## 2021-07-13 NOTE — Op Note (Signed)
OPERATIVE REPORT   PREOPERATIVE DIAGNOSES:   Menorrhagia with irregular menses, thickened endometrium.  POSTOPERATIVE DIAGNOSES:    Menorrhagia with irregular menses, thickened endometrium.  PROCEDURE:  Hysteroscopy with dilation and curettage.  SURGEON:  Lenard Galloway, MD  ANESTHESIA:  LMA, paracervical block with 10 mL of 1% lidocaine.  IV FLUIDS:  700 cc LR  EBL:   20 cc  URINE OUTPUT:   50 cc  NORMAL SALINE DEFICIT:   938 cc   COMPLICATIONS:  None.  INDICATIONS FOR THE PROCEDURE:     The patient is a 36 year old G87 African American female who presents with recurrent irregular and heavy menstrual bleeding and anemia.  Pelvic ultrasound showed no myometrial masses.  The endometrium measured 19.69 mm and several feeder vessels were noted.  The ovaries were unremarkable.   The patient did not tolerate an office endometrial biopsy.    A plan is now made to proceed with a hysteroscopy with dilation and curettage and possible Myosure resection of endometrium; after risks, benefits and alternatives were reviewed.  FINDINGS:  Exam under anesthesia revealed a small anteverted, mobile uterus.  No adnexal masses were noted.  The uterus was sounded to 9 cm.  Initial hysteroscopy showed generally thickened endometrial thickening with cribiform formation to the endometrium throughout the uterine cavity.  Clot was noted inside the endometrial cavity.    After the curettage was performed, the uterine cavity was noted to have no specific masses.  The regions of the tubal ostia were noted and patency could not be determined.    Endometrial currettings were moderate in amount.   SPECIMENS:  Endometrial curettings.     PROCEDURE IN DETAIL:  The patient was reidentified in the preoperative hold area.  She received TED hose and PAS stockings for DVT prophylaxis.  In the operating room, the patient was placed in the dorsal lithotomy position and then an LMA anesthetic was introduced.  The  patient's lower abdomen, vagina and perineum were sterilely prepped with Betadine and the  patient's bladder was catheterized of urine.  She was sterilly draped.  An exam under anesthesia was performed.  A speculum was placed inside the vagina and a single-tooth tenaculum was placed on the anterior cervical lip.  A paracervical block was performed with a total of 10 mL of 1% lidocaine plain.  The uterus was sounded. The cervix was dilated to a #21 Pratt dilator.  The MyoSure hysteroscope was then inserted inside the uterine cavity under the continuous infusion of normal saline solution.   Findings are as noted above.   The hysteroscope was removed.  The cervix was further dilated to a #23 Pratt dilator.   The serrated and then sharp curettes were introduced into the uterine cavity and the endometrium was curetted in all 4 quadrants.   A moderate amount of endometrial curettings was obtained.  This specimen was sent to Pathology.  The Myosure hysteroscope was reinserted into the uterine cavity under the infusion of normal saline.  The uterine cavity was visualized and no fibroids were noted.  The tubal ostal regions were more adequately visualized at this time, and patency could not be confirmed.   The Myosure hysteroscope was withdrawn.  The single-tooth tenaculum which had been placed on the anterior cervical lip was removed.     Hemostasis was good, and all of the vaginal instruments were removed.  The patient was awakened and escorted to the recovery room in stable condition after she was cleansed of Betadine.  There were no complications to the procedure.   All needle, instrument and sponge counts were correct.  Lenard Galloway, MD

## 2021-07-13 NOTE — Discharge Instructions (Addendum)
Hi Ana Padilla,   I completed your hysteroscopy with dilation and curettage procedure.  I found that the lining of your uterus was generally thickened.   Everything went well today!  [name], MD

## 2021-07-13 NOTE — Anesthesia Postprocedure Evaluation (Signed)
Anesthesia Post Note  Patient: Ana Padilla  Procedure(s) Performed: DILATATION & CURETTAGE/HYSTEROSCOPY (Vagina )     Patient location during evaluation: PACU Anesthesia Type: General Level of consciousness: sedated Pain management: pain level controlled Vital Signs Assessment: post-procedure vital signs reviewed and stable Respiratory status: spontaneous breathing and respiratory function stable Cardiovascular status: stable Postop Assessment: no apparent nausea or vomiting Anesthetic complications: no   No notable events documented.  Last Vitals:  Vitals:   07/13/21 0920 07/13/21 0935  BP: 128/90 127/87  Pulse: 84 75  Resp: 20 20  Temp:  36.5 C  SpO2: 95% 94%    Last Pain:  Vitals:   07/13/21 0935  TempSrc:   PainSc: Asleep                 Justyn Langham DANIEL

## 2021-07-14 ENCOUNTER — Other Ambulatory Visit: Payer: Self-pay

## 2021-07-14 ENCOUNTER — Encounter (HOSPITAL_COMMUNITY): Payer: Self-pay | Admitting: Obstetrics and Gynecology

## 2021-07-14 ENCOUNTER — Telehealth: Payer: Self-pay

## 2021-07-14 DIAGNOSIS — D649 Anemia, unspecified: Secondary | ICD-10-CM

## 2021-07-14 DIAGNOSIS — E538 Deficiency of other specified B group vitamins: Secondary | ICD-10-CM

## 2021-07-14 DIAGNOSIS — D500 Iron deficiency anemia secondary to blood loss (chronic): Secondary | ICD-10-CM

## 2021-07-14 LAB — SURGICAL PATHOLOGY

## 2021-07-14 NOTE — Telephone Encounter (Signed)
Pt called stating she just recently got a DNC d/t heavy bleeding and feel extremely fatigue.  Pt thinks her iron is low again and wants to know can she come into get IV iron.  Notified Dr. Burr Medico of pt's request but also sent MyChart message to pt to tell her she will need to come in for lab draw prior to getting IV iron.

## 2021-07-15 ENCOUNTER — Telehealth: Payer: Self-pay | Admitting: Hematology

## 2021-07-15 NOTE — Telephone Encounter (Signed)
Per 7/12 phone line pt called and requested earlier appointment.  Appointment rescheduled per her request

## 2021-07-16 ENCOUNTER — Other Ambulatory Visit: Payer: 59

## 2021-07-17 ENCOUNTER — Ambulatory Visit (INDEPENDENT_AMBULATORY_CARE_PROVIDER_SITE_OTHER): Payer: 59 | Admitting: Obstetrics and Gynecology

## 2021-07-17 ENCOUNTER — Telehealth: Payer: Self-pay | Admitting: *Deleted

## 2021-07-17 VITALS — BP 142/96 | Temp 98.9°F

## 2021-07-17 DIAGNOSIS — R102 Pelvic and perineal pain unspecified side: Secondary | ICD-10-CM

## 2021-07-17 DIAGNOSIS — Z9889 Other specified postprocedural states: Secondary | ICD-10-CM | POA: Diagnosis not present

## 2021-07-17 DIAGNOSIS — N921 Excessive and frequent menstruation with irregular cycle: Secondary | ICD-10-CM

## 2021-07-17 MED ORDER — MEGESTROL ACETATE 40 MG PO TABS: 40.0000 mg | ORAL_TABLET | Freq: Two times a day (BID) | ORAL | 1 refills | Status: DC

## 2021-07-17 NOTE — Telephone Encounter (Signed)
Patient called had D &C on 07/13/21 report having bad cramps that started yesterday rates her cramps today 9 out of 10  yesterday it was 7 out of 10. States she has taken ibuprofen 800 mg tablet and tylenol 1,000 mg tablet,using heating pads. Yesterday her bleeding was heavier changed 4-5 pad in total, today she only changed pads twice as of now. Reports she noticed small clots, she is scheduled for post op on 07/23/21 next week. No fever, reports on Tuesday she noticed some burning with urination, but no longer has those symptoms today. Please advise

## 2021-07-17 NOTE — Progress Notes (Unsigned)
GYNECOLOGY  VISIT   HPI: 36 y.o.   Married  [place] American  female   [place] with Patient's last menstrual period was 07/03/2021 (approximate).   here for post op cramping and bleeding.  She is status post hysteroscopy with dilation and curettage on 07/13/21.  Final pathology showed benign endometrial polyp, progesterone effect and luminal thrombi consistent with bleeding.  No hyperplasia or malignancy were seen.   First 2 days post surgery, light bleeding and no significant flow.  Pain has steadily increased following surgery, and now feels a twisting pain.  When the pain increases, the bleeding increases.   Taking Motrin 800 mg bid and Tylenol 500 mg three times a day.  This helped the pain yesterday but not today.   Her last dosage of Aygestinwas 07/12/21.   Having some shakes.  No fever.   Some nausea.   GYNECOLOGIC HISTORY: Patient's last menstrual period was 07/03/2021 (approximate). 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   Dysfunction of left eustachian tube 05/25/2018   Cervical polyp 04/28/2017   Morbid obesity ([place]) 04/28/2017   Hyperprolactinemia ([place]) 02/11/2017   Pituitary adenoma ([place]) 02/11/2017   Sickle cell trait ([place]) 02/01/2017   Iron deficiency anemia due to chronic blood loss 11/04/2016    Past Medical History:  Diagnosis Date   Adenoma of pituitary ([place]) 08/2014   Anemia    Anxiety    Depression    Elevated ferritin level    G6PD deficiency 01/04/2021   Heel spur, left    IBS (irritable bowel syndrome)    Low iron    Morbid obesity ([facility])    Seasonal allergies    Sickle  cell trait ([place])    Vitamin D deficiency     Past Surgical History:  Procedure Laterality Date   ACHILLES TENDON LENGTHENING Left    BLEPHAROPLASTY Right    lower lid x 3   CHOLECYSTECTOMY  2016   DILATATION & CURETTAGE/HYSTEROSCOPY WITH MYOSURE N/A 07/13/2021   Procedure: DILATATION & CURETTAGE/HYSTEROSCOPY;  Surgeon: Nunzio Cobbs, MD;  Location: [facility];  Service: Gynecology;  Laterality: N/A;   ELECTROLYSIS OF MISDIRECTED LASHES Right 08/04/2017   Procedure: ELECTROLYSIS OF MISDIRECTED LASHES RIGHT EYE;  Surgeon: Clista Bernhardt, MD;  Location: [facility];  Service: Ophthalmology;  Laterality: Right;   ENTROPIAN REPAIR Right 08/04/2017   Procedure: REPAIR OF ENTROPION WITH TARSAL STRIP RIGHT EYE;  Surgeon: Clista Bernhardt, MD;  Location: [facility];  Service: Ophthalmology;  Laterality: Right;   HEEL SPUR RESECTION Left    HYSTEROSCOPY WITH D & C N/A 06/27/2017   Procedure: DILATATION & CURETTAGE/HYSTEROSCOPY;  Surgeon: Nunzio Cobbs, MD;  Location: [facility];  Service: Gynecology;  Laterality: N/A;   iud placement     been removed   TENDON REPAIR Left 05/21/2020   Procedure:  TENOLYSIS OF LEFT FOOT;  Surgeon: Evelina Bucy, DPM;  Location: [facility];  Service: Podiatry;  Laterality: Left;    Current Outpatient Medications  Medication Sig Dispense Refill   acetaminophen (TYLENOL) 500 MG tablet Take 1 tablet (500 mg total) by mouth every 6 (six) hours as needed for mild pain or moderate pain. 30 tablet 0   cabergoline (DOSTINEX) 0.5 MG tablet Take 0.5 tablets (0.25 mg total) by mouth 2 (two) times a week. (Patient taking differently: Take 0.5 mg by mouth 2 (two) times a week. Mon and Thurs Tablet is 1 mg) 19 tablet 2   cetirizine (ZYRTEC) 10 MG tablet Take 10 mg by mouth daily.     Cholecalciferol (VITAMIN D3) 50 MCG (2000 UT) capsule Take 6,000 Units by mouth daily.     colestipol (COLESTID) 1 g tablet Take 1 tablet (1 g total) by mouth 2 (two) times daily. (Patient taking  differently: Take 1 g by mouth daily.) 180 tablet 4   Cyanocobalamin (B-12 PO) Take 1,000 mcg by mouth daily.     ibuprofen (ADVIL) 800 MG tablet Take 1 tablet (800 mg total) by mouth every 8 (eight) hours as needed. 30 tablet 0   KLS ALLER-FLO 50 MCG/ACT nasal spray Place 2 sprays into both nostrils daily.     methocarbamol (ROBAXIN) 500 MG tablet Take 1 tablet (500 mg total) by mouth every 8 (eight) hours as needed for muscle spasms. 20 tablet 0   OVER THE COUNTER MEDICATION Take 2 capsules by mouth daily. Hemaplex     Prenatal Vit-Fe Fumarate-FA (MULTIVITAMIN-PRENATAL) 27-0.8 MG TABS tablet Take 1 tablet by mouth daily at 12 noon.     Tetrahydrozoline-Zn Sulfate (ALLERGY RELIEF EYE DROPS OP) Place 1 drop into both eyes daily.     venlafaxine XR (EFFEXOR XR) 37.5 MG 24 hr capsule Take 1 capsule (37.5 mg total) by mouth daily with breakfast. 30 capsule 1   No current facility-administered medications for this visit.     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  Tobacco Use   Smoking status: Never   Smokeless tobacco: Never  Vaping Use   Vaping Use: Never used  Substance and Sexual Activity   Alcohol use: Not Currently   Drug use: No   Sexual activity: Yes    Partners: Male    Birth control/protection: Condom  Other Topics Concern   Not on file  Social History Narrative   Not on file   Social Determinants of Health   Financial Resource Strain: Not on file   Food Insecurity: Not on file  Transportation Needs: Not on file  Physical Activity: Unknown (11/10/2020)   Exercise Vital Sign    Days of Exercise per Week: 0 days    Minutes of Exercise per Session: Not on file  Stress: Stress Concern Present (11/10/2020)  [place] Questionnaire    Feeling of Stress : Rather much  Social Connections: Unknown (11/10/2020)   Social Connection and Isolation Panel [NHANES]    Frequency of Communication with Friends and Family: Not on file    Frequency of Social Gatherings with Friends and Family: Not on file    Attends Religious Services: Not on file    Active Member of Clubs or Organizations: No    Attends Archivist Meetings: Not on file    Marital Status: Not on file  Intimate Partner Violence: Not on file    Review of Systems  Genitourinary:  Positive for pelvic pain.  All other systems reviewed and are negative.   PHYSICAL EXAMINATION:    BP (!) 142/96 (BP Location: Left Arm, Patient Position: Sitting, Cuff Size: Large)   Temp 98.9 F (37.2 C) (Oral)   LMP 07/03/2021 (Approximate)     General appearance: alert, cooperative and appears stated age   Pelvic: External genitalia:  no lesions              Urethra:  normal appearing urethra with no masses, tenderness or lesions              Bartholins and Skenes: normal                 Vagina: normal appearing vagina with normal color and discharge, no lesions              Cervix: no lesions.  Clots in the vaginal and from the os.                 Bimanual Exam:  Uterus:  normal size, contour, position, consistency, mobility, non-tender              Adnexa: no mass, fullness, tenderness         Chaperone was present for exam:  Kimalexis, CMA  ASSESSMENT  Status post hysteroscopy, dilation and curettage.   Withdrawal bleed after stopping Aygestin.  No evidence of endometritis.   PLAN  Urinalysis and culture.  Take one  Aygestin 5 mg tonight, as she has this medication at home.  She will go the her pharmacy tomorrow to start Megace 40 mg po bid until bleeding stops.  Then take one by mouth daily. FU in 2 weeks. Consider Mirena IUD under ultrasound guidance.    An After Visit Summary was printed and given to the patient.

## 2021-07-17 NOTE — Telephone Encounter (Signed)
Patient informed, patient said she will here in 30 minutes.

## 2021-07-17 NOTE — Telephone Encounter (Signed)
Office visit now or visit to ER or urgent care.

## 2021-07-19 ENCOUNTER — Encounter: Payer: Self-pay | Admitting: Obstetrics and Gynecology

## 2021-07-19 LAB — URINALYSIS, COMPLETE W/RFL CULTURE
Bacteria, UA: NONE SEEN /HPF
Bilirubin Urine: NEGATIVE
Casts: NONE SEEN /LPF
Crystals: NONE SEEN /HPF
Glucose, UA: NEGATIVE
Hyaline Cast: NONE SEEN /LPF
Ketones, ur: NEGATIVE
Leukocyte Esterase: NEGATIVE
Nitrites, Initial: NEGATIVE
RBC / HPF: >=60 /HPF — AB (ref 0–2)
Specific Gravity, Urine: 1.02 (ref 1.001–1.035)
Yeast: NONE SEEN /HPF
pH: 5.5 (ref 5.0–8.0)

## 2021-07-19 LAB — URINE CULTURE
MICRO NUMBER:: 13648523
Result:: NO GROWTH
SPECIMEN QUALITY:: ADEQUATE

## 2021-07-19 LAB — CULTURE INDICATED

## 2021-07-20 ENCOUNTER — Inpatient Hospital Stay: Payer: 59 | Admitting: Hematology

## 2021-07-20 ENCOUNTER — Inpatient Hospital Stay: Payer: 59

## 2021-07-21 ENCOUNTER — Inpatient Hospital Stay: Payer: 59 | Admitting: Hematology

## 2021-07-21 ENCOUNTER — Inpatient Hospital Stay: Payer: 59

## 2021-07-21 DIAGNOSIS — D573 Sickle-cell trait: Secondary | ICD-10-CM

## 2021-07-21 DIAGNOSIS — D500 Iron deficiency anemia secondary to blood loss (chronic): Secondary | ICD-10-CM

## 2021-07-23 ENCOUNTER — Encounter: Payer: 59 | Admitting: Obstetrics and Gynecology

## 2021-07-24 ENCOUNTER — Inpatient Hospital Stay: Payer: 59 | Admitting: Hematology

## 2021-07-24 ENCOUNTER — Inpatient Hospital Stay: Payer: 59

## 2021-07-31 ENCOUNTER — Inpatient Hospital Stay: Payer: 59 | Attending: Hematology

## 2021-07-31 ENCOUNTER — Inpatient Hospital Stay (HOSPITAL_BASED_OUTPATIENT_CLINIC_OR_DEPARTMENT_OTHER): Payer: 59 | Admitting: Hematology

## 2021-07-31 ENCOUNTER — Encounter: Payer: Self-pay | Admitting: Hematology

## 2021-07-31 VITALS — BP 113/82 | HR 103 | Temp 99.2°F | Resp 17 | Wt 371.0 lb

## 2021-07-31 DIAGNOSIS — F419 Anxiety disorder, unspecified: Secondary | ICD-10-CM | POA: Insufficient documentation

## 2021-07-31 DIAGNOSIS — D352 Benign neoplasm of pituitary gland: Secondary | ICD-10-CM | POA: Insufficient documentation

## 2021-07-31 DIAGNOSIS — N920 Excessive and frequent menstruation with regular cycle: Secondary | ICD-10-CM | POA: Diagnosis present

## 2021-07-31 DIAGNOSIS — E221 Hyperprolactinemia: Secondary | ICD-10-CM | POA: Insufficient documentation

## 2021-07-31 DIAGNOSIS — D563 Thalassemia minor: Secondary | ICD-10-CM

## 2021-07-31 DIAGNOSIS — F32A Depression, unspecified: Secondary | ICD-10-CM | POA: Insufficient documentation

## 2021-07-31 DIAGNOSIS — Z79899 Other long term (current) drug therapy: Secondary | ICD-10-CM | POA: Insufficient documentation

## 2021-07-31 DIAGNOSIS — D649 Anemia, unspecified: Secondary | ICD-10-CM

## 2021-07-31 DIAGNOSIS — E538 Deficiency of other specified B group vitamins: Secondary | ICD-10-CM

## 2021-07-31 DIAGNOSIS — D573 Sickle-cell trait: Secondary | ICD-10-CM | POA: Diagnosis not present

## 2021-07-31 DIAGNOSIS — D500 Iron deficiency anemia secondary to blood loss (chronic): Secondary | ICD-10-CM

## 2021-07-31 DIAGNOSIS — D75A Glucose-6-phosphate dehydrogenase (G6PD) deficiency without anemia: Secondary | ICD-10-CM | POA: Insufficient documentation

## 2021-07-31 LAB — CBC WITH DIFFERENTIAL (CANCER CENTER ONLY)
Abs Immature Granulocytes: 0.03 10*3/uL (ref 0.00–0.07)
Basophils Absolute: 0 10*3/uL (ref 0.0–0.1)
Basophils Relative: 0 %
Eosinophils Absolute: 0.3 10*3/uL (ref 0.0–0.5)
Eosinophils Relative: 3 %
HCT: 31.8 % — ABNORMAL LOW (ref 36.0–46.0)
Hemoglobin: 10.1 g/dL — ABNORMAL LOW (ref 12.0–15.0)
Immature Granulocytes: 0 %
Lymphocytes Relative: 23 %
Lymphs Abs: 2.2 10*3/uL (ref 0.7–4.0)
MCH: 22.5 pg — ABNORMAL LOW (ref 26.0–34.0)
MCHC: 31.8 g/dL (ref 30.0–36.0)
MCV: 71 fL — ABNORMAL LOW (ref 80.0–100.0)
Monocytes Absolute: 0.6 10*3/uL (ref 0.1–1.0)
Monocytes Relative: 6 %
Neutro Abs: 6.5 10*3/uL (ref 1.7–7.7)
Neutrophils Relative %: 68 %
Platelet Count: 491 10*3/uL — ABNORMAL HIGH (ref 150–400)
RBC: 4.48 MIL/uL (ref 3.87–5.11)
RDW: 16.5 % — ABNORMAL HIGH (ref 11.5–15.5)
WBC Count: 9.6 10*3/uL (ref 4.0–10.5)
nRBC: 0 % (ref 0.0–0.2)

## 2021-07-31 LAB — IRON AND IRON BINDING CAPACITY (CC-WL,HP ONLY)
Iron: 34 ug/dL (ref 28–170)
Saturation Ratios: 10 % — ABNORMAL LOW (ref 10.4–31.8)
TIBC: 351 ug/dL (ref 250–450)
UIBC: 317 ug/dL (ref 148–442)

## 2021-07-31 LAB — FERRITIN: Ferritin: 72 ng/mL (ref 11–307)

## 2021-07-31 LAB — SAMPLE TO BLOOD BANK

## 2021-07-31 NOTE — Progress Notes (Signed)
[place] [facility]   Telephone:([phone]) [phone] Fax:(336) [phone]   Clinic Follow up Note   Patient Care Team: [name], MD as PCP - General (Family Medicine) Nunzio Cobbs, MD as Consulting Physician (Obstetrics and Gynecology)  Date of Service:  07/31/2021  CHIEF COMPLAINT: f/u of anemia  CURRENT THERAPY:  Oral iron ("SSS tonic") IV Feraheme as needed, last 03/30/21  ASSESSMENT & PLAN:  Ana Padilla is a 36 y.o. female with   1. Iron deficiency anemia, secondary to menorrhagia -she has tried OCPs in the past which did not control IDA well. Did not tolerate most oral iron supplements -s/p IV Feraheme q1-3 months 2018 - 2019, then started SSS tonic which resolved her anemia through 2023. -She recently developed acute on chronic anemia, Hgb 9.9 on 02/23/21, with serum iron 43, TIBC 364, 12% saturation, and ferritin of 29. She received two more doses of IV Feraheme in 03/2021. -she began to have abnormal periods and excessive bleeding, underwent hysteroscopy on 07/13/21. Path was benign. -labs reviewed, hgb overall stable at 10.1.  Iron saturation 10%.  Ferritin still pending. -Given her heavy menstrual bleeding lately, and low iron, I will schedule Feraheme infusions twice in the next 2 to 3 weeks.   2. Microcytosis, sickle cell trait -She has chronically low MCV 60-70 range, even when hgb is normal, which suggests thalassemia.  -previous hemoglobinopathy panel in 2018 showed elevated Hgb A2 4.4, elevated Hgb S 31, and low Hgb A 64.6 Which is consistent with sickle cell trait (heterozygous). 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   3. G6PD gene variant, VG8M T  -She and her spouse did "23 and Me" testing to determine his sickle cell status, she was found to have G6PD gene variant T which is associated with high risk for hemolysis  with oxidative stress   5. Anxiety, depression, pituitary adenoma, obesity, hyperprolactinemia  -per PCP, endo     PLAN: iv feraheme weekly X2 in next 3 weeks  Lab in 2, 4 and 6 months  F/u in 6 months    No problem-specific Assessment & Plan notes found for this encounter.   INTERVAL HISTORY:  Ana Padilla is here for a follow up of anemia. She was last seen by NP Lacie on 03/19/21. She presents to the clinic alone. She told me about her recent situation and stressors, between her excessive bleeding, hysteroscopy, desire to have children, difficulty obtaining IUD, etc.   All other systems were reviewed with the patient and are negative.  MEDICAL HISTORY:  Past Medical History:  Diagnosis Date   Adenoma of pituitary ([place]) 08/2014   Anemia    Anxiety    Depression    Elevated ferritin level    G6PD deficiency 01/04/2021   Heel spur, left    IBS (irritable bowel syndrome)    Low iron    Morbid obesity ([place])    Seasonal allergies    Sickle cell trait ([place])    Vitamin D deficiency     SURGICAL HISTORY: Past Surgical History:  Procedure Laterality Date   ACHILLES TENDON LENGTHENING Left    BLEPHAROPLASTY Right    lower lid x 3   CHOLECYSTECTOMY  2016   DILATATION & CURETTAGE/HYSTEROSCOPY WITH MYOSURE N/A 07/13/2021   Procedure: DILATATION & CURETTAGE/HYSTEROSCOPY;  Surgeon: Nunzio Cobbs, MD;  Location: [facility];  Service: Gynecology;  Laterality: N/A;   ELECTROLYSIS OF MISDIRECTED LASHES Right 08/04/2017   Procedure: ELECTROLYSIS OF MISDIRECTED LASHES RIGHT EYE;  Surgeon: Clista Bernhardt, MD;  Location: [facility];  Service: Ophthalmology;  Laterality: Right;   ENTROPIAN REPAIR Right 08/04/2017   Procedure: REPAIR OF ENTROPION WITH TARSAL STRIP RIGHT EYE;  Surgeon: Clista Bernhardt, MD;  Location: [facility];  Service: Ophthalmology;  Laterality: Right;   HEEL SPUR RESECTION Left    HYSTEROSCOPY WITH D & C N/A 06/27/2017   Procedure: DILATATION &  CURETTAGE/HYSTEROSCOPY;  Surgeon: Nunzio Cobbs, MD;  Location: [facility];  Service: Gynecology;  Laterality: N/A;   iud placement     been removed   TENDON REPAIR Left 05/21/2020   Procedure: TENOLYSIS OF LEFT FOOT;  Surgeon: Evelina Bucy, DPM;  [facility];  Service: Podiatry;  Laterality: Left;    I have reviewed the social history and family history with the patient and they are unchanged from previous note.  ALLERGIES:  is allergic to food, pollinex-t [modified tree tyrosine adsorbate], and quinine.  MEDICATIONS:  Current Outpatient Medications  Medication Sig Dispense Refill   acetaminophen (TYLENOL) 500 MG tablet Take 1 tablet (500 mg total) by mouth every 6 (six) hours as needed for mild pain or moderate pain. 30 tablet 0   cabergoline (DOSTINEX) 0.5 MG tablet Take 0.5 tablets (0.25 mg total) by mouth 2 (two) times a week. (Patient taking differently: Take 0.5 mg by mouth 2 (two) times a week. Mon and Thurs Tablet is 1 mg) 19 tablet 2   cetirizine (ZYRTEC) 10 MG tablet Take 10 mg by mouth daily.     Cholecalciferol (VITAMIN D3) 50 MCG (2000 UT) capsule Take 6,000 Units by mouth daily.     colestipol (COLESTID) 1 g tablet Take 1 tablet (1 g total) by mouth 2 (two) times daily. (Patient taking differently: Take 1 g by mouth daily.) 180 tablet 4   Cyanocobalamin (B-12 PO) Take 1,000 mcg by mouth daily.     ibuprofen (ADVIL) 800 MG tablet Take 1 tablet (800 mg total) by mouth every 8 (eight) hours as needed. 30 tablet 0   KLS ALLER-FLO 50 MCG/ACT nasal spray Place 2 sprays into both nostrils daily.     megestrol (MEGACE) 40 MG tablet Take 1 tablet (40 mg total) by mouth 2 (two) times daily. Reduce to 1 tablet by mouth once a day when the bleeding stops. 60 tablet 1   methocarbamol (ROBAXIN) 500 MG tablet Take 1 tablet (500 mg total) by mouth every 8 (eight) hours as needed for muscle spasms. 20 tablet 0   OVER THE COUNTER MEDICATION Take 2 capsules by mouth daily.  Hemaplex     Prenatal Vit-Fe Fumarate-FA (MULTIVITAMIN-PRENATAL) 27-0.8 MG TABS tablet Take 1 tablet by mouth daily at 12 noon.     Tetrahydrozoline-Zn Sulfate (ALLERGY RELIEF EYE DROPS OP) Place 1 drop into both eyes daily.     venlafaxine XR (EFFEXOR XR) 37.5 MG 24 hr capsule Take 1 capsule (37.5 mg total) by mouth daily with breakfast. 30 capsule 1   No current facility-administered medications for this visit.    PHYSICAL EXAMINATION:  Vitals:   07/31/21 0834  BP: 113/82  Pulse: (!) 103  Resp: 17  Temp: 99.2 F (37.3 C)  SpO2: 99%   Wt Readings from Last 3 Encounters:  07/31/21 (!) 371 lb (168.3 kg)  07/13/21 (!) 365 lb (165.6 kg)  06/25/21 (!) 366 lb (166 kg)  GENERAL:alert, no distress and comfortable SKIN: skin color normal, no rashes or significant lesions EYES: normal, Conjunctiva are pink and non-injected, sclera clear  NEURO: alert & oriented x 3 with fluent speech  LABORATORY DATA:  I have reviewed the data as listed    Latest Ref Rng & Units 07/31/2021    8:02 AM 07/13/2021    6:07 AM 06/12/2021    7:37 AM  CBC  WBC 4.0 - 10.5 K/uL 9.6  9.3  6.9   Hemoglobin 12.0 - 15.0 g/dL 10.1  9.5  10.0   Hematocrit 36.0 - 46.0 % 31.8  30.2  30.4   Platelets 150 - 400 K/uL 491  434  365.0         Latest Ref Rng & Units 03/19/2021    1:28 PM 12/10/2020   10:46 AM 08/09/2019   11:56 AM  CMP  Glucose 70 - 99 mg/dL 98  91  92   BUN 6 - 20 mg/dL '8  10  12   '$ Creatinine 0.44 - 1.00 mg/dL 0.72  0.70  0.75   Sodium 135 - 145 mmol/L 140  139  138   Potassium 3.5 - 5.1 mmol/L 3.9  3.7  4.3   Chloride 98 - 111 mmol/L 104  101  102   CO2 22 - 32 mmol/L 29  32  30   Calcium 8.9 - 10.3 mg/dL 9.4  9.9  9.6   Total Protein 6.5 - 8.1 g/dL 7.8  7.5  7.1   Total Bilirubin 0.3 - 1.2 mg/dL 0.4  0.4  0.5   Alkaline Phos 38 - 126 U/L 72  65    AST 15 - 41 U/L '26  15  15   '$ ALT 0 - 44 U/L '19  13  11       '$ RADIOGRAPHIC STUDIES: I have personally reviewed the radiological images as  listed and agreed with the findings in the report. No results found.    No orders of the defined types were placed in this encounter.  All questions were answered. The patient knows to call the clinic with any problems, questions or concerns. No barriers to learning was detected. The total time spent in the appointment was 25 minutes.     Truitt Merle, MD 07/31/2021   I, Wilburn Mylar, am acting as scribe for Truitt Merle, MD.   I have reviewed the above documentation for accuracy and completeness, and I agree with the above.

## 2021-08-04 ENCOUNTER — Telehealth: Payer: Self-pay | Admitting: Hematology

## 2021-08-04 NOTE — Telephone Encounter (Signed)
Scheduled follow-up appointments per 7/28 los. Patient is aware. 

## 2021-08-05 NOTE — Progress Notes (Signed)
GYNECOLOGY  VISIT   HPI: 36 y.o.   Married  [place] American  female   Ana Padilla with Patient's last menstrual period was 07/17/2021 (approximate).   here for 3 weeks status post DILATATION & CURETTAGE/HYSTEROSCOPY (Vag[place]). Pathology report showed benign endometrium with progestational effect and benign endometrial polyp.   No hyperplasia or malignancy seen.  Patient seen on 07/17/21 with heavy bleeding and cramping post op with withdrawal bleed due to stopping Aygestin.  She was started on Megace 40 mg po bid. She has not been able to stop taking the Megace bid, because the bleeding restarts when she tries to reduce the Megace once daily.  Patient complaining of irritation in left groin area.  She has had a previous Mirena IUD expel. She states she does not have insurance benefits for IUDs.   She has breakthrough bleeding on COCs.  Seasonale, Seasonique, Loestrin, triphasic pill.   Does not want Depo Provera due to potential weight gain.   GYNECOLOGIC HISTORY: Patient's last menstrual period was 07/17/2021 (approximate). Contraception:  condoms Menopausal hormone therapy:  none. 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   Dysfunction of left eustachian tube 05/25/2018   Cervical polyp 04/28/2017   Morbid obesity ([place]) 04/28/2017   Hyperprolactinemia ([place]) 02/11/2017   Pituitary adenoma ([place]) 02/11/2017   Sickle cell trait ([place]) 02/01/2017   Iron deficiency anemia due to chronic blood loss 11/04/2016    Past Medical History:  Diagnosis Date   Adenoma of pituitary ([place]) 08/2014   Anemia    Anxiety     Depression    Elevated ferritin level    G6PD deficiency 01/04/2021   Heel spur, left    IBS (irritable bowel syndrome)    Low iron    Morbid obesity ([facility])    Seasonal allergies    Sickle cell trait ([place])    Vitamin D deficiency     Past Surgical History:  Procedure Laterality Date   ACHILLES TENDON LENGTHENING Left    BLEPHAROPLASTY Right    lower lid x 3   CHOLECYSTECTOMY  2016   DILATATION & CURETTAGE/HYSTEROSCOPY WITH MYOSURE N/A 07/13/2021   Procedure: DILATATION & CURETTAGE/HYSTEROSCOPY;  Surgeon: Nunzio Cobbs, MD;  Location: [facility];  Service: Gynecology;  Laterality: N/A;   ELECTROLYSIS OF MISDIRECTED LASHES Right 08/04/2017   Procedure: ELECTROLYSIS OF MISDIRECTED LASHES RIGHT EYE;  Surgeon: Clista Bernhardt, MD;  Location: [facility];  Service: Ophthalmology;  Laterality: Right;   ENTROPIAN REPAIR Right 08/04/2017   Procedure: REPAIR OF ENTROPION WITH TARSAL STRIP RIGHT EYE;  Surgeon: Clista Bernhardt, MD;  Location: [facility];  Service: Ophthalmology;  Laterality: Right;   HEEL SPUR RESECTION Left    HYSTEROSCOPY WITH D & C N/A 06/27/2017   Procedure: DILATATION & CURETTAGE/HYSTEROSCOPY;  Surgeon: Nunzio Cobbs, MD;  Location:  [facility];  Service: Gynecology;  Laterality: N/A;   iud placement     been removed   TENDON REPAIR Left 05/21/2020   Procedure: TENOLYSIS OF LEFT FOOT;  Surgeon: Evelina Bucy, DPM;  Location: [facility];  Service: Podiatry;  Laterality: Left;    Current Outpatient Medications  Medication Sig Dispense Refill   acetaminophen (TYLENOL) 500 MG tablet Take 1 tablet (500 mg total) by mouth every 6 (six) hours as needed for mild pain or moderate pain. 30 tablet 0   cabergoline (DOSTINEX) 0.5 MG tablet Take 0.5 tablets (0.25 mg total) by mouth 2 (two) times a week. (Patient taking differently: Take 0.5 mg by mouth 2 (two) times a week. Mon and Thurs Tablet is 1 mg) 19 tablet 2   cetirizine (ZYRTEC) 10 MG tablet Take 10 mg by mouth daily.      Cholecalciferol (VITAMIN D3) 50 MCG (2000 UT) capsule Take 6,000 Units by mouth daily.     colestipol (COLESTID) 1 g tablet Take 1 tablet (1 g total) by mouth 2 (two) times daily. (Patient taking differently: Take 1 g by mouth daily.) 180 tablet 4   Cyanocobalamin (B-12 PO) Take 1,000 mcg by mouth daily.     KLS ALLER-FLO 50 MCG/ACT nasal spray Place 2 sprays into both nostrils daily.     megestrol (MEGACE) 40 MG tablet Take 1 tablet (40 mg total) by mouth 2 (two) times daily. Reduce to 1 tablet by mouth once a day when the bleeding stops. 60 tablet 1   methocarbamol (ROBAXIN) 500 MG tablet Take 1 tablet (500 mg total) by mouth every 8 (eight) hours as needed for muscle spasms. 20 tablet 0   OVER THE COUNTER MEDICATION SSS Tonic Takes 3 tbsp daily     Prenatal Vit-Fe Fumarate-FA (MULTIVITAMIN-PRENATAL) 27-0.8 MG TABS tablet Take 1 tablet by mouth daily at 12 noon.     sulfamethoxazole-trimethoprim (BACTRIM DS) 800-160 MG tablet Take 1 tablet by mouth 2 (two) times daily. Take for 7 days. 14 tablet 0   Tetrahydrozoline-Zn Sulfate (ALLERGY RELIEF EYE DROPS OP) Place 1 drop into both eyes daily.     No current facility-administered medications for this visit.     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  Tobacco Use   Smoking status: Never   Smokeless tobacco: Never  Vaping Use   Vaping Use: Never used  Substance and Sexual Activity   Alcohol use: Not Currently    Drug use: No   Sexual activity: Yes    Partners: Male    Birth control/protection: Condom  Other Topics Concern   Not on file  Social History Narrative   Not on file   Social Determinants of Health   Financial Resource Strain: Not on file  Food Insecurity: Not on file  Transportation Needs: Not on file  Physical Activity:  Unknown (11/10/2020)   Exercise Vital Sign    Days of Exercise per Week: 0 days    Minutes of Exercise per Session: Not on file  Stress: Stress Concern Present (11/10/2020)   [place]    Feeling of Stress : Rather much  Social Connections: Unknown (11/10/2020)   Social Connection and Isolation Panel [NHANES]    Frequency of Communication with Friends and Family: Not on file    Frequency of Social Gatherings with Friends and Family: Not on file    Attends Religious Services: Not on file    Active Member of Clubs or Organizations: No    Attends Archivist Meetings: Not on file    Marital Status: Not on file  Intimate Partner Violence: Not on file    Review of Systems  Genitourinary:  Positive for genital sores (left groin irritation).  All other systems reviewed and are negative.   PHYSICAL EXAMINATION:    BP 122/78   Ht '5\' 7"'$  (1.702 m)   Wt (!) 365 lb (165.6 kg)   LMP 07/17/2021 (Approximate)   BMI 57.17 kg/m     General appearance: alert, cooperative and appears stated age  Pelvic: External genitalia:  no lesions              Urethra:  normal appearing urethra with no masses, tenderness or lesions              Bartholins and Skenes: normal                 Vagina: normal appearing vagina with normal color and discharge, no lesions              Cervix: no lesions                Bimanual Exam:  Uterus:  normal size, contour, position, consistency, mobility, non-tender              Adnexa: no mass, fullness, tenderness   Right medial superior thigh - 4.5 cm abscess noted,  tender.   I and D abscess Consent done. Sterile prep with betadine.  1% lidocaine [id number], exp Jan 05, 2023 Incision with scalpel.  Large amount of pus drained.  Abscess cavity explored with a hemostat.  Wound culture collected from site.  Sterile dressing placed.   Chaperone was present for exam:  Estill Bamberg, CMA.  ASSESSMENT  Status post hysteroscopy with dilation and curettage.  Long history of abnormal uterine bleeding.   Currently on Megace bid.  Right buttock abscess.  Contraceptive counseling.   PLAN  We discussed options to treat bleeding: cyclic Provera 10 mg x 10 days per month, Mirena IUD, Nexplanon, COCs.  Will check benefits for Mirena and Nexplanon.  She will reduce to Megace 40 mg daily starting on 08/10/21. Wound culture.  Bactrim DS po bid x 7 days.  FU in 10 days for a recheck.    An After Visit Summary was printed and given to the patient.  35 min  total time was spent for this patient encounter, including preparation, face-to-face counseling with the patient, coordination of care, and documentation of the encounter in addition to doing the incision and drainage of the abscess.

## 2021-08-07 ENCOUNTER — Ambulatory Visit (INDEPENDENT_AMBULATORY_CARE_PROVIDER_SITE_OTHER): Payer: 59 | Admitting: Obstetrics and Gynecology

## 2021-08-07 ENCOUNTER — Encounter: Payer: Self-pay | Admitting: Obstetrics and Gynecology

## 2021-08-07 VITALS — BP 122/78 | Ht 67.0 in | Wt 365.0 lb

## 2021-08-07 DIAGNOSIS — Z9889 Other specified postprocedural states: Secondary | ICD-10-CM

## 2021-08-07 DIAGNOSIS — Z3009 Encounter for other general counseling and advice on contraception: Secondary | ICD-10-CM | POA: Diagnosis not present

## 2021-08-07 DIAGNOSIS — L02415 Cutaneous abscess of right lower limb: Secondary | ICD-10-CM

## 2021-08-07 DIAGNOSIS — Z8742 Personal history of other diseases of the female genital tract: Secondary | ICD-10-CM | POA: Diagnosis not present

## 2021-08-07 MED ORDER — SULFAMETHOXAZOLE-TRIMETHOPRIM 800-160 MG PO TABS: 1.0000 | ORAL_TABLET | Freq: Two times a day (BID) | ORAL | 0 refills | Status: DC

## 2021-08-10 LAB — WOUND CULTURE
MICRO NUMBER:: 13737292
SPECIMEN QUALITY:: ADEQUATE

## 2021-08-12 ENCOUNTER — Ambulatory Visit (INDEPENDENT_AMBULATORY_CARE_PROVIDER_SITE_OTHER): Payer: 59

## 2021-08-12 ENCOUNTER — Ambulatory Visit: Payer: 59 | Admitting: Podiatry

## 2021-08-12 ENCOUNTER — Encounter (INDEPENDENT_AMBULATORY_CARE_PROVIDER_SITE_OTHER): Payer: Self-pay

## 2021-08-12 DIAGNOSIS — M722 Plantar fascial fibromatosis: Secondary | ICD-10-CM

## 2021-08-12 MED ORDER — METHYLPREDNISOLONE 4 MG PO TBPK: ORAL_TABLET | ORAL | 0 refills | Status: DC

## 2021-08-12 NOTE — Progress Notes (Signed)
Chief Complaint  Patient presents with   Foot Pain    left foot pain/tightness, patient states that if she walks a lot she has tingling and numbness.patient has worn a brace and it is not helping.    HPI: 36 y.o. female PMHx morbid obesity and PSxHx gastroc lengthening with retrocalcaneal exostectomy and repair of Achilles LT performed by Dr. Hardie Pulley, no longer with our practice, in a staged procedure.  Last DOS: 05/21/2020 presenting for evaluation of continued posterior and plantar heel pain to the left lower extremity.  Patient states that by the end of the day if she is doing excessive amount of walking it is very painful.  She has to rest her foot.  She currently takes naproxen as needed.  Patient works from home and does not do a significant amount of standing or walking for work.    Past Medical History:  Diagnosis Date   Adenoma of pituitary ([place]) 08/2014   Anemia    Anxiety    Depression    Elevated ferritin level    G6PD deficiency 01/04/2021   Heel spur, left    IBS (irritable bowel syndrome)    Low iron    Morbid obesity ([place])    Seasonal allergies    Sickle cell trait ([place])    Vitamin D deficiency     Past Surgical History:  Procedure Laterality Date   ACHILLES TENDON LENGTHENING Left    BLEPHAROPLASTY Right    lower lid x 3   CHOLECYSTECTOMY  2016   DILATATION & CURETTAGE/HYSTEROSCOPY WITH MYOSURE N/A 07/13/2021   Procedure: DILATATION & CURETTAGE/HYSTEROSCOPY;  Surgeon: Nunzio Cobbs, MD;  Location: [facility];  Service: Gynecology;  Laterality: N/A;   ELECTROLYSIS OF MISDIRECTED LASHES Right 08/04/2017   Procedure: ELECTROLYSIS OF MISDIRECTED LASHES RIGHT EYE;  Surgeon: Clista Bernhardt, MD;  Location: [facility];  Service: Ophthalmology;  Laterality: Right;   ENTROPIAN REPAIR Right 08/04/2017   Procedure: REPAIR OF ENTROPION WITH TARSAL STRIP RIGHT EYE;  Surgeon: Clista Bernhardt, MD;  Location: [facility];  Service: Ophthalmology;  Laterality: Right;   HEEL  SPUR RESECTION Left    HYSTEROSCOPY WITH D & C N/A 06/27/2017   Procedure: DILATATION & CURETTAGE/HYSTEROSCOPY;  Surgeon: Nunzio Cobbs, MD;  Location: [facility];  Service: Gynecology;  Laterality: N/A;   iud placement     been removed   TENDON REPAIR Left 05/21/2020   Procedure: TENOLYSIS OF LEFT FOOT;  Surgeon: Evelina Bucy, DPM;  Location[facility];  Service: Podiatry;  Laterality: Left;    Allergies  Allergen Reactions   Food Other (See Comments)    Seasonal Allergies & fresh fruit---itchy mouth&nose    Pollinex-T [Modified Tree Tyrosine Adsorbate]     Itchy nose and eyes, ears, runny nose,    Quinine Other (See Comments)    g6pd deficiency     Physical Exam: General: The patient is alert and oriented x3 in no acute distress.  Dermatology: Skin is warm, dry and supple bilateral lower extremities. Negative for open lesions or macerations.  Mild hypertrophy of the scar to the posterior aspect of the ankle/Achilles tendon area.  Vascular: Palpable pedal pulses bilaterally. Capillary refill within normal limits.  Negative for any significant edema or erythema  Neurological: Light touch and protective threshold grossly intact  Musculoskeletal Exam: No pedal deformities noted.  Good ankle joint dorsiflexion.  Range of motion WNL.  There is some tenderness palpation along the posterior tubercle of  the calcaneus and the plantar heel.  Radiographic Exam LT foot 08/12/2021:  Normal osseous mineralization.  Posterior exostectomy noted.  There is some ossicles extending proximal to the posterior treatment will be calcaneus.  No acute fractures identified.  Assessment: 1.  Posterior plantar heel pain left 2.  Plantar fasciitis left   Plan of Care:  1. Patient evaluated. X-Rays reviewed.  2.  Discussed different treatment options for both acute alleviation of symptoms as well as long-term solutions. 3.  Long-term I do recommend custom molded orthotics.  Patient is going to  contact her insurance to see if it is a covered expense.  She will arrange an appointment with our orthotics department if it is a covered expense.  This should help support the medial longitudinal arch of the foot and alleviate pressure from the heel 4.  Patient declined steroid cortisone injection 5.  Prescription for Medrol Dosepak 6.  Return to clinic as needed      Edrick Kins, DPM [facility]  Dr. Edrick Kins, DPM    [address], [place] [address]                Office [phone]  Fax [phone]

## 2021-08-13 ENCOUNTER — Other Ambulatory Visit: Payer: Self-pay

## 2021-08-14 ENCOUNTER — Inpatient Hospital Stay: Payer: 59 | Attending: Hematology

## 2021-08-14 ENCOUNTER — Other Ambulatory Visit: Payer: Self-pay

## 2021-08-14 VITALS — BP 124/89 | HR 94 | Temp 99.6°F | Resp 18

## 2021-08-14 DIAGNOSIS — N920 Excessive and frequent menstruation with regular cycle: Secondary | ICD-10-CM | POA: Insufficient documentation

## 2021-08-14 DIAGNOSIS — D500 Iron deficiency anemia secondary to blood loss (chronic): Secondary | ICD-10-CM | POA: Insufficient documentation

## 2021-08-14 DIAGNOSIS — Z79899 Other long term (current) drug therapy: Secondary | ICD-10-CM | POA: Diagnosis not present

## 2021-08-14 MED ORDER — SODIUM CHLORIDE 0.9 % IV SOLN
510.0000 mg | Freq: Once | INTRAVENOUS | Status: AC
  Administered 2021-08-14: 510 mg via INTRAVENOUS
  Filled 2021-08-14: qty 510

## 2021-08-14 MED ORDER — SODIUM CHLORIDE 0.9 % IV SOLN: Freq: Once | INTRAVENOUS | Status: AC

## 2021-08-14 NOTE — Patient Instructions (Signed)

## 2021-08-14 NOTE — Progress Notes (Signed)
Pt declined to be observed for 30 minutes post Feraheme infusion. Pt tolerated trtmt well w/out incident. VSS at discharge.  Ambulatory to lobby in stable condition w/ no complaints.

## 2021-08-17 ENCOUNTER — Ambulatory Visit (INDEPENDENT_AMBULATORY_CARE_PROVIDER_SITE_OTHER): Payer: 59 | Admitting: Obstetrics and Gynecology

## 2021-08-17 ENCOUNTER — Encounter: Payer: Self-pay | Admitting: Obstetrics and Gynecology

## 2021-08-17 VITALS — BP 130/78 | Wt 371.0 lb

## 2021-08-17 DIAGNOSIS — N921 Excessive and frequent menstruation with irregular cycle: Secondary | ICD-10-CM

## 2021-08-17 DIAGNOSIS — Z3009 Encounter for other general counseling and advice on contraception: Secondary | ICD-10-CM

## 2021-08-17 MED ORDER — NORETHINDRONE 0.35 MG PO TABS: 1.0000 | ORAL_TABLET | Freq: Every day | ORAL | 3 refills | Status: DC

## 2021-08-17 NOTE — Progress Notes (Signed)
GYNECOLOGY  VISIT   HPI: 36 y.o.   Married  [place] American  female   [place] with Patient's last menstrual period was 07/17/2021 (approximate).   here for follow up. Patient doing much better.  Status post hysteroscopy for menorrhagia with irregular bleeding.  This is a recurrent, chronic issue for the patient.  Has been treated with Aygestin and then Megace.  She was reduced to Megace 40 mg daily.  No bleeding for about 2 - 3 weeks.  We discussed a possible Mirena IUD versus monthly Provera versus birth control pill.  Patient wants to start a transition to another option next month.   She had an iron infusion on 08/14/21.  She has another one scheduled for this week as well.   When she was seen on 08/07/21, she had a right buttock abscess that was drained.  She received Bactrim DS po bid for 1 week.  Her final wound culture showed skin bacteria.  Area is much improved.   Would like pregnancy in 6 - 12 months.  Taking a PNV.   GYNECOLOGIC HISTORY: Patient's last menstrual period was 07/17/2021 (approximate). 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   Dysfunction of left eustachian tube 05/25/2018   Cervical polyp 04/28/2017   Morbid obesity ([place]) 04/28/2017   Hyperprolactinemia ([place]) 02/11/2017   Pituitary adenoma ([place]) 02/11/2017   Sickle cell trait ([place]) 02/01/2017   Iron deficiency anemia due to chronic blood loss 11/04/2016    Past Medical History:  Diagnosis Date   Adenoma of pituitary ([place]) 08/2014   Anemia    Anxiety     Depression    Elevated ferritin level    G6PD deficiency 01/04/2021   Heel spur, left    IBS (irritable bowel syndrome)    Low iron    Morbid obesity ([facility])    Seasonal allergies    Sickle cell trait ([place])    Vitamin D deficiency     Past Surgical History:  Procedure Laterality Date   ACHILLES TENDON LENGTHENING Left    BLEPHAROPLASTY Right    lower lid x 3   CHOLECYSTECTOMY  2016   DILATATION & CURETTAGE/HYSTEROSCOPY WITH MYOSURE N/A 07/13/2021   Procedure: DILATATION & CURETTAGE/HYSTEROSCOPY;  Surgeon: Nunzio Cobbs, MD;  Location: [facility];  Service: Gynecology;  Laterality: N/A;   ELECTROLYSIS OF MISDIRECTED LASHES Right 08/04/2017   Procedure: ELECTROLYSIS OF MISDIRECTED LASHES RIGHT EYE;  Surgeon: Clista Bernhardt, MD;  Location: [facility];  Service: Ophthalmology;  Laterality: Right;   ENTROPIAN REPAIR Right 08/04/2017   Procedure: REPAIR OF ENTROPION WITH TARSAL STRIP RIGHT EYE;  Surgeon: Clista Bernhardt, MD;  Location: [facility];  Service: Ophthalmology;  Laterality: Right;   HEEL SPUR RESECTION Left    HYSTEROSCOPY WITH D &  C N/A 06/27/2017   Procedure: DILATATION & CURETTAGE/HYSTEROSCOPY;  Surgeon: Nunzio Cobbs, MD;  Location: [facility];  Service: Gynecology;  Laterality: N/A;   iud placement     been removed   TENDON REPAIR Left 05/21/2020   Procedure: TENOLYSIS OF LEFT FOOT;  Surgeon: Evelina Bucy, DPM;  [facility];  Service: Podiatry;  Laterality: Left;    Current Outpatient Medications  Medication Sig Dispense Refill   acetaminophen (TYLENOL) 500 MG tablet Take 1 tablet (500 mg total) by mouth every 6 (six) hours as needed for mild pain or moderate pain. 30 tablet 0   cabergoline (DOSTINEX) 0.5 MG tablet Take 0.5 tablets (0.25 mg total) by mouth 2 (two) times a week. (Patient taking differently: Take 0.5 mg by mouth 2 (two) times a week. Mon and Thurs Tablet is 1 mg) 19 tablet 2   cetirizine (ZYRTEC) 10 MG tablet Take 10 mg by mouth daily.      Cholecalciferol (VITAMIN D3) 50 MCG (2000 UT) capsule Take 6,000 Units by mouth daily.     colestipol (COLESTID) 1 g tablet Take 1 tablet (1 g total) by mouth 2 (two) times daily. (Patient taking differently: Take 1 g by mouth daily.) 180 tablet 4   Cyanocobalamin (B-12 PO) Take 1,000 mcg by mouth daily.     KLS ALLER-FLO 50 MCG/ACT nasal spray Place 2 sprays into both nostrils daily.     megestrol (MEGACE) 40 MG tablet Take 1 tablet (40 mg total) by mouth 2 (two) times daily. Reduce to 1 tablet by mouth once a day when the bleeding stops. 60 tablet 1   methocarbamol (ROBAXIN) 500 MG tablet Take 1 tablet (500 mg total) by mouth every 8 (eight) hours as needed for muscle spasms. 20 tablet 0   methylPREDNISolone (MEDROL DOSEPAK) 4 MG TBPK tablet 6 day dose pack - take as directed 21 tablet 0   OVER THE COUNTER MEDICATION SSS Tonic Takes 3 tbsp daily     Prenatal Vit-Fe Fumarate-FA (MULTIVITAMIN-PRENATAL) 27-0.8 MG TABS tablet Take 1 tablet by mouth daily at 12 noon.     Tetrahydrozoline-Zn Sulfate (ALLERGY RELIEF EYE DROPS OP) Place 1 drop into both eyes daily.     No current facility-administered medications for this visit.     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  Tobacco Use   Smoking status: Never   Smokeless tobacco: Never  Vaping Use   Vaping Use: Never used  Substance and Sexual Activity   Alcohol use: Not Currently   Drug use: No   Sexual activity: Yes     Partners: Male    Birth control/protection: Condom  Other Topics Concern   Not on file  Social History Narrative   Not on file   Social Determinants of Health   Financial Resource Strain: Not on file  Food Insecurity: Not on file  Transportation Needs: Not on file  Physical Activity: Unknown (11/10/2020)   Exercise Vital Sign    Days of Exercise per Week: 0 days    Minutes of Exercise per Session: Not on file  Stress: Stress Concern Present (11/10/2020)   [place]    Feeling of Stress : Rather much  Social Connections: Unknown (11/10/2020)   Social Connection and Isolation Panel [NHANES]    Frequency of Communication with Friends and Family: Not on file    Frequency of Social Gatherings with Friends and Family: Not on file    Attends Religious Services: Not on file    Active Member of Clubs or Organizations: No    Attends Archivist Meetings: Not on file    Marital Status: Not on file  Intimate Partner Violence: Not on file    Review of Systems  All other systems reviewed and are negative.   PHYSICAL EXAMINATION:    BP 130/78   Wt (!) 371 lb (168.3 kg)   LMP 07/17/2021 (Approximate)   BMI 58.11 kg/m     General appearance: alert, cooperative and appears stated age   Pelvic: External genitalia:  no lesions              Urethra:  normal appearing urethra with no masses, tenderness or lesions              Bartholins and Skenes: normal                 Vagina: normal appearing vagina with normal color and discharge, no lesions              Cervix: no lesions                Bimanual Exam:  Uterus:  normal size, contour, position, consistency, mobility, non-tender              Adnexa: no mass, fullness, tenderness             Right buttock with 1 cm area of induration, nontender, nonfluctuant.   Chaperone was present for exam:  Estill Bamberg, CMA  ASSESSMENT  Menorrhagia with irregular menses.    Bleeding resolved on Aygestin taper.  Iron deficiency.  Desire for pregnancy.  PLAN  We discussed options for care:  cyclic Provera, Ortho Micronor and combined oral contraception.  Will continue Megestrol for 2 more weeks and then start Ortho Micronor.  Refills given until annual exam is due.  Continue PNV.  FU prn.    An After Visit Summary was printed and given to the patient.  26 min  total time was spent for this patient encounter, including preparation, face-to-face counseling with the patient, coordination of care, and documentation of the encounter.

## 2021-08-21 ENCOUNTER — Telehealth: Payer: Self-pay | Admitting: Podiatry

## 2021-08-21 ENCOUNTER — Other Ambulatory Visit: Payer: Self-pay | Admitting: Podiatry

## 2021-08-21 ENCOUNTER — Inpatient Hospital Stay: Payer: 59

## 2021-08-21 ENCOUNTER — Other Ambulatory Visit: Payer: Self-pay

## 2021-08-21 VITALS — BP 112/71 | HR 92 | Temp 98.9°F | Resp 16

## 2021-08-21 DIAGNOSIS — D500 Iron deficiency anemia secondary to blood loss (chronic): Secondary | ICD-10-CM

## 2021-08-21 DIAGNOSIS — M722 Plantar fascial fibromatosis: Secondary | ICD-10-CM

## 2021-08-21 MED ORDER — SODIUM CHLORIDE 0.9 % IV SOLN
510.0000 mg | Freq: Once | INTRAVENOUS | Status: AC
  Administered 2021-08-21: 510 mg via INTRAVENOUS
  Filled 2021-08-21: qty 510

## 2021-08-21 NOTE — Telephone Encounter (Signed)
Spoke with patient and she will come by the office and pick up the prescription from the front desk as advised per Dr. Madie Reno verbalized that she will come by.

## 2021-08-21 NOTE — Patient Instructions (Signed)

## 2021-08-21 NOTE — Telephone Encounter (Signed)
Pt called asking for orthotic dept she has left several messages. I did apologize and explain we are in transition in that dept. She is wanting to get orthotics like Dr Amalia Hailey recommended, I explained we are not currently ordering any new orthotics until September. She has contacted her insurance and would like a rx to go to another place to get the orthotics.

## 2021-08-21 NOTE — Telephone Encounter (Signed)
Order for custom molded orthotics placed in the patient's chart.  Please notify patient that she can come up to the front desk and pick up her prescription to take to [facility].  Thanks, Dr. Amalia Hailey

## 2021-08-24 ENCOUNTER — Ambulatory Visit: Payer: 59 | Admitting: Family Medicine

## 2021-08-26 ENCOUNTER — Telehealth: Payer: Self-pay | Admitting: Podiatry

## 2021-08-26 NOTE — Telephone Encounter (Signed)
Left message for patient to call and schedule an appt for orthotics

## 2021-09-11 ENCOUNTER — Ambulatory Visit (INDEPENDENT_AMBULATORY_CARE_PROVIDER_SITE_OTHER): Payer: 59 | Admitting: Family Medicine

## 2021-09-11 VITALS — BP 112/80 | HR 89 | Temp 98.5°F | Wt 372.4 lb

## 2021-09-11 DIAGNOSIS — R400 Somnolence: Secondary | ICD-10-CM

## 2021-09-11 DIAGNOSIS — F4321 Adjustment disorder with depressed mood: Secondary | ICD-10-CM

## 2021-09-11 DIAGNOSIS — R5383 Other fatigue: Secondary | ICD-10-CM

## 2021-09-11 LAB — CBC WITH DIFFERENTIAL/PLATELET
Basophils Absolute: 0 10*3/uL (ref 0.0–0.1)
Basophils Relative: 0.2 % (ref 0.0–3.0)
Eosinophils Absolute: 0.2 10*3/uL (ref 0.0–0.7)
Eosinophils Relative: 2.9 % (ref 0.0–5.0)
HCT: 33.8 % — ABNORMAL LOW (ref 36.0–46.0)
Hemoglobin: 11.1 g/dL — ABNORMAL LOW (ref 12.0–15.0)
Lymphocytes Relative: 22.7 % (ref 12.0–46.0)
Lymphs Abs: 1.8 10*3/uL (ref 0.7–4.0)
MCHC: 32.7 g/dL (ref 30.0–36.0)
MCV: 69.1 fl — ABNORMAL LOW (ref 78.0–100.0)
Monocytes Absolute: 0.5 10*3/uL (ref 0.1–1.0)
Monocytes Relative: 6 % (ref 3.0–12.0)
Neutro Abs: 5.4 10*3/uL (ref 1.4–7.7)
Neutrophils Relative %: 68.2 % (ref 43.0–77.0)
Platelets: 410 10*3/uL — ABNORMAL HIGH (ref 150.0–400.0)
RBC: 4.88 Mil/uL (ref 3.87–5.11)
RDW: 19.2 % — ABNORMAL HIGH (ref 11.5–15.5)
WBC: 7.9 10*3/uL (ref 4.0–10.5)

## 2021-09-11 LAB — BASIC METABOLIC PANEL
BUN: 10 mg/dL (ref 6–23)
CO2: 27 mEq/L (ref 19–32)
Calcium: 10.2 mg/dL (ref 8.4–10.5)
Chloride: 103 mEq/L (ref 96–112)
Creatinine, Ser: 0.76 mg/dL (ref 0.40–1.20)
GFR: 102.27 mL/min (ref >60.00)
Glucose, Bld: 89 mg/dL (ref 70–99)
Potassium: 3.8 mEq/L (ref 3.5–5.1)
Sodium: 139 mEq/L (ref 135–145)

## 2021-09-11 LAB — VITAMIN B12: Vitamin B-12: 479 pg/mL (ref 211–911)

## 2021-09-11 LAB — VITAMIN D 25 HYDROXY (VIT D DEFICIENCY, FRACTURES): VITD: 30.77 ng/mL (ref 30.00–100.00)

## 2021-09-11 LAB — TSH: TSH: 1.61 u[IU]/mL (ref 0.35–5.50)

## 2021-09-11 LAB — T4, FREE: Free T4: 0.77 ng/dL (ref 0.60–1.60)

## 2021-09-11 NOTE — Progress Notes (Signed)
Subjective:    Patient ID: Eulogio Bear, female    DOB: 1985-08-08, 36 y.o.   MRN: [id number]  Chief Complaint  Patient presents with  . Fatigue    Feeling down depressed.August was the month her grandmother passed away, was oing thru grief. Just had iron infusion, thinks it is vit D.     HPI Patient was seen today for ongoing concern.  Patient states she feels "off".  Endorses decreased energy, daytime somnolence, fatigue.  Had an iron infusion a few weeks ago.  States difficult to get out of bed and do things she previously enjoyed doing.  States does not feel depressed.  Notes August was difficult as it was the anniversary of her Grandmother's death.  Pt was in [place] with family.  Past Medical History:  Diagnosis Date  . 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 Anemia   . Anxiety   . Depression   . Elevated ferritin level   . G6PD deficiency 01/04/2021  . Heel spur, left   . IBS (irritable bowel syndrome)   . Low iron   . Morbid obesity (HCC)   . Seasonal allergies   . Sickle cell trait (HCC)   . Vitamin D deficiency     Allergies  Allergen Reactions  . Food Other (See Comments)    Seasonal Allergies & fresh fruit---itchy mouth&nose   . Pollinex-T [Modified Tree Tyrosine Adsorbate]     Itchy nose and eyes, ears, runny nose,   . Quinine Other (See Comments)    g6pd deficiency    ROS General: Denies fever, chills, night sweats, changes in weight, changes in appetite HEENT: Denies headaches, ear pain, changes in vision, rhinorrhea, sore throat CV: Denies CP, palpitations, SOB, orthopnea Pulm: Denies SOB, cough, wheezing GI: Denies abdominal pain, nausea, vomiting, diarrhea, constipation GU: Denies dysuria, hematuria, frequency, vaginal discharge Msk: Denies muscle cramps, joint pains Neuro: Denies weakness, numbness, tingling Skin: Denies rashes, bruising Psych: Denies depression, anxiety, hallucinations      Objective:    Blood pressure 112/80, pulse 89,  temperature 98.5 F (36.9 C), temperature source Oral, weight (!) 342 lb 6.4 oz (155.3 kg), SpO2 98 %.   Gen. Pleasant, well-nourished, in no distress, normal affect  *** HEENT: [place]/AT, face symmetric, conjunctiva clear, no scleral icterus, PERRLA, EOMI, nares patent without drainage, pharynx without erythema or exudate. Neck: No JVD, no thyromegaly, no carotid bruits Lungs: no accessory muscle use, CTAB, no wheezes or rales Cardiovascular: RRR, no m/r/g, no ***peripheral edema Abdomen: BS present, soft, NT/ND, no hepatosplenomegaly. Musculoskeletal: No deformities, no cyanosis or clubbing, normal tone Neuro:  A&Ox3, CN II-XII intact, normal gait Skin:  Warm, no lesions/ rash   Wt Readings from Last 3 Encounters:  09/11/21 (!) 342 lb 6.4 oz (155.3 kg)  08/17/21 (!) 371 lb (168.3 kg)  08/07/21 (!) 365 lb (165.6 kg)    Lab Results  Component Value Date   WBC 9.6 07/31/2021   HGB 10.1 (L) 07/31/2021   HCT 31.8 (L) 07/31/2021   PLT 491 (H) 07/31/2021   GLUCOSE 98 03/19/2021   CHOL 190 02/23/2021   TRIG 140.0 02/23/2021   HDL 63.10 02/23/2021   LDLCALC 99 02/23/2021   ALT 19 03/19/2021   AST 26 03/19/2021   NA 140 03/19/2021   K 3.9 03/19/2021   CL 104 03/19/2021   CREATININE 0.72 03/19/2021   BUN 8 03/19/2021   CO2 29 03/19/2021   TSH 1.80 12/10/2020   INR 0.92 08/04/2017   HGBA1C  5.8 12/10/2020    Assessment/Plan:  No diagnosis found.  F/u ***  Abbe Amsterdam, MD

## 2021-09-12 LAB — IRON,TIBC AND FERRITIN PANEL
%SAT: 17 % (calc) (ref 16–45)
Ferritin: 322 ng/mL — ABNORMAL HIGH (ref 16–154)
Iron: 56 ug/dL (ref 40–190)
TIBC: 325 mcg/dL (calc) (ref 250–450)

## 2021-09-15 ENCOUNTER — Encounter: Payer: Self-pay | Admitting: Family Medicine

## 2021-09-28 ENCOUNTER — Other Ambulatory Visit: Payer: 59

## 2021-10-01 ENCOUNTER — Inpatient Hospital Stay: Payer: 59

## 2021-10-13 ENCOUNTER — Emergency Department (HOSPITAL_COMMUNITY): Payer: 59

## 2021-10-13 ENCOUNTER — Encounter (HOSPITAL_COMMUNITY): Payer: Self-pay

## 2021-10-13 ENCOUNTER — Emergency Department (HOSPITAL_COMMUNITY)
Admission: EM | Admit: 2021-10-13 | Discharge: 2021-10-13 | Disposition: A | Discharge status: Home / Self Care | Payer: 59 | Attending: Emergency Medicine | Admitting: Emergency Medicine

## 2021-10-13 DIAGNOSIS — R9389 Abnormal findings on diagnostic imaging of other specified body structures: Secondary | ICD-10-CM | POA: Diagnosis present

## 2021-10-13 DIAGNOSIS — N939 Abnormal uterine and vaginal bleeding, unspecified: Secondary | ICD-10-CM | POA: Diagnosis not present

## 2021-10-13 DIAGNOSIS — R102 Pelvic and perineal pain: Secondary | ICD-10-CM | POA: Diagnosis not present

## 2021-10-13 LAB — WET PREP, GENITAL
Clue Cells Wet Prep HPF POC: NONE SEEN
Sperm: NONE SEEN
Trich, Wet Prep: NONE SEEN
WBC, Wet Prep HPF POC: >=10 — AB (ref <10)
Yeast Wet Prep HPF POC: NONE SEEN

## 2021-10-13 LAB — CBC WITH DIFFERENTIAL/PLATELET
Abs Immature Granulocytes: 0.07 10*3/uL (ref 0.00–0.07)
Basophils Absolute: 0 10*3/uL (ref 0.0–0.1)
Basophils Relative: 0 %
Eosinophils Absolute: 0.3 10*3/uL (ref 0.0–0.5)
Eosinophils Relative: 4 %
HCT: 29 % — ABNORMAL LOW (ref 36.0–46.0)
Hemoglobin: 8.6 g/dL — ABNORMAL LOW (ref 12.0–15.0)
Immature Granulocytes: 1 %
Lymphocytes Relative: 20 %
Lymphs Abs: 1.8 10*3/uL (ref 0.7–4.0)
MCH: 22.8 pg — ABNORMAL LOW (ref 26.0–34.0)
MCHC: 29.7 g/dL — ABNORMAL LOW (ref 30.0–36.0)
MCV: 76.9 fL — ABNORMAL LOW (ref 80.0–100.0)
Monocytes Absolute: 0.5 10*3/uL (ref 0.1–1.0)
Monocytes Relative: 6 %
Neutro Abs: 6.2 10*3/uL (ref 1.7–7.7)
Neutrophils Relative %: 69 %
Platelets: 426 10*3/uL — ABNORMAL HIGH (ref 150–400)
RBC: 3.77 MIL/uL — ABNORMAL LOW (ref 3.87–5.11)
RDW: 21.2 % — ABNORMAL HIGH (ref 11.5–15.5)
WBC: 8.9 10*3/uL (ref 4.0–10.5)
nRBC: 0.4 % — ABNORMAL HIGH (ref 0.0–0.2)

## 2021-10-13 LAB — BASIC METABOLIC PANEL
Anion gap: 10 (ref 5–15)
BUN: 8 mg/dL (ref 6–20)
CO2: 23 mmol/L (ref 22–32)
Calcium: 9.2 mg/dL (ref 8.9–10.3)
Chloride: 106 mmol/L (ref 98–111)
Creatinine, Ser: 0.78 mg/dL (ref 0.44–1.00)
GFR, Estimated: >60 mL/min (ref >60)
Glucose, Bld: 110 mg/dL — ABNORMAL HIGH (ref 70–99)
Potassium: 3.7 mmol/L (ref 3.5–5.1)
Sodium: 139 mmol/L (ref 135–145)

## 2021-10-13 LAB — I-STAT BETA HCG BLOOD, ED (MC, WL, AP ONLY): I-stat hCG, quantitative: <5 m[IU]/mL (ref <5)

## 2021-10-13 LAB — URINALYSIS, ROUTINE W REFLEX MICROSCOPIC
Bilirubin Urine: NEGATIVE
Glucose, UA: NEGATIVE mg/dL
Hgb urine dipstick: NEGATIVE
Ketones, ur: NEGATIVE mg/dL
Leukocytes,Ua: NEGATIVE
Nitrite: NEGATIVE
Protein, ur: NEGATIVE mg/dL
Specific Gravity, Urine: 1.017 (ref 1.005–1.030)
pH: 5 (ref 5.0–8.0)

## 2021-10-13 LAB — PREGNANCY, URINE: Preg Test, Ur: NEGATIVE

## 2021-10-13 LAB — HIV ANTIBODY (ROUTINE TESTING W REFLEX): HIV Screen 4th Generation wRfx: NONREACTIVE

## 2021-10-13 MED ORDER — KETOROLAC TROMETHAMINE 15 MG/ML IJ SOLN
15.0000 mg | Freq: Once | INTRAMUSCULAR | Status: AC
  Administered 2021-10-13: 15 mg via INTRAVENOUS
  Filled 2021-10-13: qty 1

## 2021-10-13 NOTE — ED Triage Notes (Signed)
Pt arrived via POV c/o pelvic pain, states right side pain is worse. No vaginal bleeding.

## 2021-10-13 NOTE — ED Provider Notes (Signed)
[facility] DEPT Provider Note   CSN: [id number] Arrival date & time: 10/13/21  6269     History  Chief Complaint  Patient presents with   Pelvic Pain    Ana Padilla is a 36 y.o. female with a past medical history of obesity, abnormal uterine bleeding and iron deficiency anemia presenting today due to pelvic pain.  She reports an extensive pelvic history.  Reports that she was dealing with menorrhagia and had a D&C in June.  In July she was started on Megace and then in August she preferred to try the minipill to the Megace.  She says that this changed her mood and she continued to have bleeding concerns so pill was discontinued on 9/25.  Last menstrual period September 19.  Denies any history of ovarian cysts or torsion.  No vaginal odor, discharge, nausea, vomiting or diarrhea.  Follows with Dr. Quincy Simmonds with OB/GYN   Pelvic Pain Pertinent negatives include no abdominal pain.       Home Medications Prior to Admission medications   Medication Sig Start Date End Date Taking? Authorizing Provider  acetaminophen (TYLENOL) 500 MG tablet Take 1 tablet (500 mg total) by mouth every 6 (six) hours as needed for mild pain or moderate pain. 06/27/17   Nunzio Cobbs, MD  cabergoline (DOSTINEX) 0.5 MG tablet Take 0.5 tablets (0.25 mg total) by mouth 2 (two) times a week. Patient taking differently: Take 0.5 mg by mouth 2 (two) times a week. Mon and Thurs Tablet is 1 mg 07/03/20   Philemon Kingdom, MD  cetirizine (ZYRTEC) 10 MG tablet Take 10 mg by mouth daily.    [provider]  Cholecalciferol (VITAMIN D3) 50 MCG (2000 UT) capsule Take 6,000 Units by mouth daily.    [provider]  colestipol (COLESTID) 1 g tablet Take 1 tablet (1 g total) by mouth 2 (two) times daily. Patient taking differently: Take 1 g by mouth daily. 05/04/21   Esterwood, Amy S, PA-C  Cyanocobalamin (B-12 PO) Take 1,000 mcg by mouth daily.    [provider]  KLS ALLER-FLO 50 MCG/ACT nasal spray Place 2 sprays into both nostrils daily. 05/04/21   [provider]  methocarbamol (ROBAXIN) 500 MG tablet Take 1 tablet (500 mg total) by mouth every 8 (eight) hours as needed for muscle spasms. 07/21/20   Rancour, Annie Main, MD  methylPREDNISolone (MEDROL DOSEPAK) 4 MG TBPK tablet 6 day dose pack - take as directed 08/12/21   Edrick Kins, DPM  norethindrone (ORTHO MICRONOR) 0.35 MG tablet Take 1 tablet (0.35 mg total) by mouth daily. 08/17/21   Amundson Raliegh Ip, MD  OVER THE COUNTER MEDICATION SSS Tonic Takes 3 tbsp daily    [provider]  Prenatal Vit-Fe Fumarate-FA (MULTIVITAMIN-PRENATAL) 27-0.8 MG TABS tablet Take 1 tablet by mouth daily at 12 noon.    [provider]  Tetrahydrozoline-Zn Sulfate (ALLERGY RELIEF EYE DROPS OP) Place 1 drop into both eyes daily.    [provider]      Allergies    Food, Pollinex-t [modified tree tyrosine adsorbate], and Quinine    Review of Systems   Review of Systems  Constitutional:  Negative for fever.  Gastrointestinal:  Negative for abdominal pain, nausea and vomiting.  Genitourinary:  Positive for pelvic pain. Negative for dyspareunia, dysuria, flank pain, genital sores and hematuria.    Physical Exam Updated Vital Signs BP (!) 143/89 (BP Location: Left Arm)   Pulse  97   Temp 99.1 F (37.3 C) (Oral)   Resp 18   SpO2 99%  Physical Exam Vitals and nursing note reviewed.  Constitutional:      General: She is not in acute distress.    Appearance: Normal appearance. She is obese. She is not ill-appearing.  HENT:     Head: Normocephalic and atraumatic.  Eyes:     General: No scleral icterus.    Conjunctiva/sclera: Conjunctivae normal.  Pulmonary:     Effort: Pulmonary effort is normal. No respiratory distress.  Abdominal:     General: Abdomen is flat.     Palpations: Abdomen is soft.     Comments: Tenderness closer to the pelvic/groin area.   No other abdominal tenderness  Genitourinary:    Comments: GU exam performed in presence of female nurse tech.  Patient had normal external genitalia.  Vaginal vault was without abnormal discharge or bleeding.  Cervix well in appearance.  No CMT.  No palpated masses on bimanual however patient had extreme tenderness on the right-sided adnexa. Skin:    General: Skin is warm and dry.     Findings: No rash.  Neurological:     Mental Status: She is alert.  Psychiatric:        Mood and Affect: Mood normal.     ED Results / Procedures / Treatments   Labs (all labs ordered are listed, but only abnormal results are displayed) Labs Reviewed  WET PREP, GENITAL - Abnormal; Notable for the following components:      Result Value   WBC, Wet Prep HPF POC >=10 (*)    All other components within normal limits  CBC WITH DIFFERENTIAL/PLATELET - Abnormal; Notable for the following components:   RBC 3.77 (*)    Hemoglobin 8.6 (*)    HCT 29.0 (*)    MCV 76.9 (*)    MCH 22.8 (*)    MCHC 29.7 (*)    RDW 21.2 (*)    Platelets 426 (*)    nRBC 0.4 (*)    All other components within normal limits  BASIC METABOLIC PANEL - Abnormal; Notable for the following components:   Glucose, Bld 110 (*)    All other components within normal limits  URINALYSIS, ROUTINE W REFLEX MICROSCOPIC  PREGNANCY, URINE  HIV ANTIBODY (ROUTINE TESTING W REFLEX)  I-STAT BETA HCG BLOOD, ED ([facility], [facility], [facility] ONLY)  GC/CHLAMYDIA PROBE AMP ([facility]) NOT AT [facility]    EKG None  Radiology US Transvaginal Non-OB  Result Date: 10/13/2021 CLINICAL DATA:  Vaginal bleeding EXAM: TRANSABDOMINAL AND TRANSVAGINAL ULTRASOUND OF PELVIS DOPPLER ULTRASOUND OF OVARIES TECHNIQUE: Both transabdominal and transvaginal ultrasound examinations of the pelvis were performed. Transabdominal technique was performed for global imaging of the pelvis including uterus, ovaries, adnexal regions, and pelvic cul-de-sac. It was necessary to proceed with  endovaginal exam following the transabdominal exam to visualize the endometrium and ovaries. Color and duplex Doppler ultrasound was utilized to evaluate blood flow to the ovaries. COMPARISON:  06/11/2021 FINDINGS: Uterus Measurements: 9.9 x 5.5 x 6.7 cm = volume: 190.2 mL. No fibroids or other mass visualized. Endometrium Thickness: 12 mm. Small amount of fluid is seen in the endometrial cavity. There is no abnormal increased vascularity in the endometrium. Right ovary Measurements: 2.9 x 3.4 x 2.6 cm = volume: 13.3 mL. Normal appearance/no adnexal mass. Left ovary Measurements: 2.5 x 1.7 x 2.8 cm = volume: 6.3 mL. Normal appearance/no adnexal mass. Pulsed Doppler evaluation of both ovaries demonstrates normal low-resistance arterial  and venous waveforms. Other findings Trace amount of free fluid is seen. IMPRESSION: Endometrial stripe is prominent measuring 12 mm. There is no abnormal increased vascularity in the endometrium in color Doppler study. Small amount of fluid is seen in the endometrial cavity, possibly blood products. Trace amount of free fluid in pelvis may be physiological. There are no adnexal masses. There is no evidence of ovarian torsion. Electronically Signed   By: Elmer Picker M.D.   On: 10/13/2021 11:05   [place] Art/Ven Flow Abd Pelv Doppler  Result Date: 10/13/2021 CLINICAL DATA:  Vaginal bleeding EXAM: TRANSABDOMINAL AND TRANSVAGINAL ULTRASOUND OF PELVIS DOPPLER ULTRASOUND OF OVARIES TECHNIQUE: Both transabdominal and transvaginal ultrasound examinations of the pelvis were performed. Transabdominal technique was performed for global imaging of the pelvis including uterus, ovaries, adnexal regions, and pelvic cul-de-sac. It was necessary to proceed with endovaginal exam following the transabdominal exam to visualize the endometrium and ovaries. Color and duplex Doppler ultrasound was utilized to evaluate blood flow to the ovaries. COMPARISON:  06/11/2021 FINDINGS: Uterus Measurements:  9.9 x 5.5 x 6.7 cm = volume: 190.2 mL. No fibroids or other mass visualized. Endometrium Thickness: 12 mm. Small amount of fluid is seen in the endometrial cavity. There is no abnormal increased vascularity in the endometrium. Right ovary Measurements: 2.9 x 3.4 x 2.6 cm = volume: 13.3 mL. Normal appearance/no adnexal mass. Left ovary Measurements: 2.5 x 1.7 x 2.8 cm = volume: 6.3 mL. Normal appearance/no adnexal mass. Pulsed Doppler evaluation of both ovaries demonstrates normal low-resistance arterial and venous waveforms. Other findings Trace amount of free fluid is seen. IMPRESSION: Endometrial stripe is prominent measuring 12 mm. There is no abnormal increased vascularity in the endometrium in color Doppler study. Small amount of fluid is seen in the endometrial cavity, possibly blood products. Trace amount of free fluid in pelvis may be physiological. There are no adnexal masses. There is no evidence of ovarian torsion. Electronically Signed   By: Elmer Picker M.D.   On: 10/13/2021 11:05    Procedures Procedures   Medications Ordered in ED Medications  ketorolac (TORADOL) 15 MG/ML injection 15 mg (15 mg Intravenous Given 10/13/21 0948)    ED Course/ Medical Decision Making/ A&P                           Medical Decision Making Amount and/or Complexity of Data Reviewed Labs: ordered. Radiology: ordered.  Risk Prescription drug management.   36 year old female presenting today with right pelvic pain.  Differential includes but is not limited to pregnancy, ectopic pregnancy, ovarian cyst, ovarian torsion, ruptured ovarian cyst, UTI/pyelo-, STD/PID or TOA  This is not an exhaustive differential.    Past Medical History / Co-morbidities / Social History: Menorrhagia, D&C, obesity   Additional history: Per external chart review patient follows closely with [facility] primary care.  She has had an extensive history of iron deficiency anemia, often in the setting of menorrhagia    Physical Exam: Pertinent physical exam findings include Borderline febrile and borderline tachycardia Right adnexal tenderness on bimanual  Lab Tests: I ordered, and personally interpreted labs.  The pertinent results include: Hemoglobin 8.6 down from 11.10-monthago   Imaging Studies: I ordered and independently visualized and interpreted pelvic ultrasound and I agree with the radiologist that has a thickened endometrium.  No signs of torsion or ectopic    Medications: I ordered medication including Toradol. Reevaluation of the patient after these medicines showed that the patient improved.  MDM/Disposition: This is a 36 year old female presenting today with right-sided pelvic pain.  Has a history of menorrhagia.  Hemoglobin 8.6, down from 11 a month ago likely due to heavy menstrual bleeding.  She is borderline tachycardic however no shortness of breath, dizziness or lightheadedness.  Believe she is safe for discharge.  I did consider for patient work-up secondary to patient's low-grade temperature and tachycardia however there are no signs of infection on her physical exam or lab work.  Normal white count.  Ultrasound abscess and pelvic exam unremarkable from an infection standpoint.  She is agreeable to discharge and follow-up with OB/GYN.   Final Clinical Impression(s) / ED Diagnoses Final diagnoses:  Endometrial thickening on ultrasound    Rx / DC Orders ED Discharge Orders     None      Results and diagnoses were explained to the patient. Return precautions discussed in full. Patient had no additional questions and expressed complete understanding.   This chart was dictated using voice recognition software.  Despite best efforts to proofread,  errors can occur which can change the documentation meaning.    Rhae Hammock, PA-C 10/13/21 1222    Cristie Hem, MD 10/15/21 [phone]

## 2021-10-13 NOTE — Discharge Instructions (Addendum)
Your endometrium is measuring 12 today.  It was 19 in June so it has decreased.  Please follow-up with Dr. Quincy Simmonds as planned.  Your hemoglobin was 8.6 which is lower than the 11 that you were a month ago.  Please return with any shortness of breath, dizziness, palpitations or other worsening symptoms. It was a pleasure to meet you today and we hope you feel better!

## 2021-10-14 ENCOUNTER — Telehealth: Payer: 59 | Admitting: Family Medicine

## 2021-10-14 LAB — GC/CHLAMYDIA PROBE AMP (~~LOC~~) NOT AT ARMC
Chlamydia: NEGATIVE
Comment: NEGATIVE
Comment: NORMAL
Neisseria Gonorrhea: NEGATIVE

## 2021-10-14 NOTE — Progress Notes (Signed)
GYNECOLOGY  VISIT   HPI: 36 y.o.   Married  [place] American  female   [place] with Patient's last menstrual period was 09/22/2021.   here for ED follow up for pelvic pain. She is having pelvic pain that wakes her up from her sleep. She states that its been present for about a week and half.  She had a neg pregnancy test on 10/15/21.  GC/CT/trich all negative.  Hgb 8.6.  She had a pelvic US showing small fluid in the endometrial canal, possible blood.   Normal ovaries.     Patient was last seen in the [place] on 08/17/21 in office for menorrhagia with irregular menses, which has been a long standing issue. She has undergone a hysteroscopy with Myosure and dilation and curettage on 07/13/21 and the final pathology showed benign endometrium and marked progesterone effect and benign endometrial polyp. She was to continue post op on Megestrol for 2 more weeks and then start Ortho Micronor, which she did the first of September.   She had Mirena twice and the insertion was very difficult for her each time.   She had heavy bleeding with each IUD and ultimately had them removed.    She has taken Aygestin in the past and did well with this.   Patient may do another iron infusion with hematology.   Her ultimate goal is weight loss and a pregnancy.  GYNECOLOGIC HISTORY: Patient's last menstrual period was 09/22/2021. 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   Dysfunction of left eustachian tube 05/25/2018   Cervical polyp 04/28/2017   Morbid obesity ([facility]) 04/28/2017    Hyperprolactinemia ([facility]) 02/11/2017   Pituitary adenoma ([facility]) 02/11/2017   Sickle cell trait ([facility]) 02/01/2017   Iron deficiency anemia due to chronic blood loss 11/04/2016    Past Medical History:  Diagnosis Date   Adenoma of pituitary ([facility]) 08/2014   Anemia    Anxiety    Depression    Elevated ferritin level    G6PD deficiency 01/04/2021   Heel spur, left    IBS (irritable bowel syndrome)    Low iron    Morbid obesity (HCC)    Seasonal allergies    Sickle cell trait ([facility])    Vitamin D deficiency     Past Surgical History:  Procedure Laterality Date   ACHILLES TENDON LENGTHENING Left    BLEPHAROPLASTY Right    lower lid x 3   CHOLECYSTECTOMY  2016   DILATATION & CURETTAGE/HYSTEROSCOPY WITH MYOSURE N/A 07/13/2021   Procedure: DILATATION & CURETTAGE/HYSTEROSCOPY;  Surgeon: Nunzio Cobbs, MD;  Location: [facility];  Service: Gynecology;  Laterality: N/A;   ELECTROLYSIS OF MISDIRECTED LASHES Right 08/04/2017   Procedure: ELECTROLYSIS OF MISDIRECTED LASHES RIGHT EYE;  Surgeon: Georjean Mode  Rosezella Florida, MD;  Location: [facility];  Service: Ophthalmology;  Laterality: Right;   ENTROPIAN REPAIR Right 08/04/2017   Procedure: REPAIR OF ENTROPION WITH TARSAL STRIP RIGHT EYE;  Surgeon: Clista Bernhardt, MD;  Location: [facility];  Service: Ophthalmology;  Laterality: Right;   HEEL SPUR RESECTION Left    HYSTEROSCOPY WITH D & C N/A 06/27/2017   Procedure: DILATATION & CURETTAGE/HYSTEROSCOPY;  Surgeon: Nunzio Cobbs, MD;  Location: [facility];  Service: Gynecology;  Laterality: N/A;   iud placement     been removed   TENDON REPAIR Left 05/21/2020   Procedure: TENOLYSIS OF LEFT FOOT;  Surgeon: Evelina Bucy, DPM;  Location: [facility];  Service: Podiatry;  Laterality: Left;    Current Outpatient Medications  Medication Sig Dispense Refill   acetaminophen (TYLENOL) 500 MG tablet Take 1 tablet (500 mg total) by mouth every 6 (six) hours as needed for mild pain or moderate pain. 30 tablet 0    cabergoline (DOSTINEX) 0.5 MG tablet Take 0.5 tablets (0.25 mg total) by mouth 2 (two) times a week. (Patient taking differently: Take 0.5 mg by mouth 2 (two) times a week. Mon and Thurs Tablet is 1 mg) 19 tablet 2   cetirizine (ZYRTEC) 10 MG tablet Take 10 mg by mouth daily.     Cholecalciferol (VITAMIN D3) 50 MCG (2000 UT) capsule Take 6,000 Units by mouth daily.     colestipol (COLESTID) 1 g tablet Take 1 tablet (1 g total) by mouth 2 (two) times daily. (Patient taking differently: Take 1 g by mouth daily.) 180 tablet 4   Cyanocobalamin (B-12 PO) Take 1,000 mcg by mouth daily.     Ferrous Sulfate (IRON PO) Take 125 mg by mouth daily.     KLS ALLER-FLO 50 MCG/ACT nasal spray Place 2 sprays into both nostrils daily.     OVER THE COUNTER MEDICATION SSS Tonic Takes 3 tbsp daily     Prenatal Vit-Fe Fumarate-FA (MULTIVITAMIN-PRENATAL) 27-0.8 MG TABS tablet Take 1 tablet by mouth daily at 12 noon.     Tetrahydrozoline-Zn Sulfate (ALLERGY RELIEF EYE DROPS OP) Place 1 drop into both eyes daily.     No current facility-administered medications for this visit.     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  Tobacco Use   Smoking status: Never   Smokeless tobacco: Never  Vaping Use   Vaping Use: Never used  Substance and Sexual Activity   Alcohol use: Not Currently   Drug use: No   Sexual activity: Yes    Partners: Male    Birth control/protection: Condom  Other Topics  Concern   Not on file  Social History Narrative   Not on file   Social Determinants of Health   Financial Resource Strain: Not on file  Food Insecurity: Not on file  Transportation Needs: Not  on file  Physical Activity: Unknown (11/10/2020)   Exercise Vital Sign    Days of Exercise per Week: 0 days    Minutes of Exercise per Session: Not on file  Stress: Stress Concern Present (11/10/2020)   [place]    Feeling of Stress : Rather much  Social Connections: Unknown (11/10/2020)   Social Connection and Isolation Panel [NHANES]    Frequency of Communication with Friends and Family: Not on file    Frequency of Social Gatherings with Friends and Family: Not on file    Attends Religious Services: Not on file    Active Member of Clubs or Organizations: No    Attends Archivist Meetings: Not on file    Marital Status: Not on file  Intimate Partner Violence: Not on file    Review of Systems  Genitourinary:  Positive for pelvic pain.    PHYSICAL EXAMINATION:    BP 120/84   Pulse 89   Ht '5\' 7"'$  (1.702 m)   Wt (!) 378 lb (171.5 kg)   LMP 09/22/2021   SpO2 100%   BMI 59.20 kg/m     General appearance: alert, cooperative and appears stated age   Pelvic: External genitalia:  no lesions              Urethra:  normal appearing urethra with no masses, tenderness or lesions              Bartholins and Skenes: normal                 Vagina: normal appearing vagina with normal color and discharge, no lesions              Cervix: no lesions                Bimanual Exam:  Uterus:  normal size, contour, position, consistency, mobility, non-tender              Adnexa: no mass, fullness, tenderness            Chaperone was present for exam:  Eustace Pen, CMA  ASSESSMENT  Menorrhagia with irregular menses.  Pituitary adenoma.  On Dostinex.  BMI 59.   PLAN  UPT - negative.  Start Aygestin 5 mgm po bid.  They will  use condoms with sexual activity.  She will see her PCP for weight loss management.  She will see her hematologist for treatment of anemia.  Return in 4 months for a recheck, sooner if needed.  Patient expresses appreciation for her care.    An After Visit Summary was printed and given to the patient.  37 min  total time was spent for this patient encounter, including preparation, face-to-face counseling with the patient, coordination of care, and documentation of the encounter.

## 2021-10-15 ENCOUNTER — Telehealth: Payer: 59 | Admitting: Family Medicine

## 2021-10-15 DIAGNOSIS — N939 Abnormal uterine and vaginal bleeding, unspecified: Secondary | ICD-10-CM

## 2021-10-15 DIAGNOSIS — R5383 Other fatigue: Secondary | ICD-10-CM

## 2021-10-15 DIAGNOSIS — D500 Iron deficiency anemia secondary to blood loss (chronic): Secondary | ICD-10-CM

## 2021-10-15 NOTE — Progress Notes (Signed)
Virtual Visit via Video Note  I connected with Ana Padilla on 10/15/21 at 11:30 AM EDT by a video enabled telemedicine application 2/2 [id number] pandemic and verified that I am speaking with the correct person using two identifiers.  Location patient: home Location provider:work or home office Persons participating in the virtual visit: patient, provider  I discussed the limitations of evaluation and management by telemedicine and the availability of in person appointments. The patient expressed understanding and agreed to proceed.  Chief Complaint  Patient presents with   Follow-up    Still feeling tired. Went to ED and hemoglobin was 8.6. wants to see about iron infusions, they are telling her a few weeks.  Also wants to know about fasting glucose.    HPI:  Pt is a 36 yo female with pmh sig for h/o iron deficiency anemia 2/2 AUB, obesity, pituitary adenoma, G6PD deficiency, IBS, seasonal allergies, sickle cell trait, vitamin D deficiency, anxiety, depression who seen for follow-up. Pt seen 10/13/2021 in ED for R ovary pain.  States pelvic u/s with lining of uterus thick at 12 mm.  Hgb was 8.6, when previously 11.1 on 09/11/21. Endorses heavy menses Sept 19 th.  Pt taking Wow liquid iron.  Was taking SS tonic, but it has been hard to find.  Pt tried provera, Agestin, D&C, megace, then minipill.  Stopped taking minipill 9/25.  Pt wants to take a break from hormones.  Doesn't want an IUD or a nexplanon.  Has appt next wk with OB/Gyn, Dr. Quincy Simmonds.  Heme/Onc appt in 2 wks.    ROS: See pertinent positives and negatives per HPI.  Past Medical History:  Diagnosis Date   Adenoma of pituitary ([place]) 08/2014   Anemia    Anxiety    Depression    Elevated ferritin level    G6PD deficiency 01/04/2021   Heel spur, left    IBS (irritable bowel syndrome)    Low iron    Morbid obesity ([place])    Seasonal allergies    Sickle cell trait ([place])    Vitamin D deficiency     Past Surgical History:   Procedure Laterality Date   ACHILLES TENDON LENGTHENING Left    BLEPHAROPLASTY Right    lower lid x 3   CHOLECYSTECTOMY  2016   DILATATION & CURETTAGE/HYSTEROSCOPY WITH MYOSURE N/A 07/13/2021   Procedure: DILATATION & CURETTAGE/HYSTEROSCOPY;  Surgeon: Nunzio Cobbs, MD;  Location: [place];  Service: Gynecology;  Laterality: N/A;   ELECTROLYSIS OF MISDIRECTED LASHES Right 08/04/2017   Procedure: ELECTROLYSIS OF MISDIRECTED LASHES RIGHT EYE;  Surgeon: Clista Bernhardt, MD;  Location: [place];  Service: Ophthalmology;  Laterality: Right;   ENTROPIAN REPAIR Right 08/04/2017   Procedure: REPAIR OF ENTROPION WITH TARSAL STRIP RIGHT EYE;  Surgeon: Clista Bernhardt, MD;  Location: [place];  Service: Ophthalmology;  Laterality: Right;   HEEL SPUR RESECTION Left    HYSTEROSCOPY WITH D & C N/A 06/27/2017   Procedure: DILATATION & CURETTAGE/HYSTEROSCOPY;  Surgeon: Nunzio Cobbs, MD;  Location: [facility];  Service: Gynecology;  Laterality: N/A;   iud placement     been removed   TENDON REPAIR Left 05/21/2020   Procedure: TENOLYSIS OF LEFT FOOT;  Surgeon: Evelina Bucy, DPM;  Location: [facility];  Service: Podiatry;  Laterality: Left;    Family History  Adopted: Yes  Problem Relation Age of Onset   Sickle cell trait Mother    Heart disease Father    Obesity Father  Colon cancer Neg Hx    Esophageal cancer Neg Hx    Stomach cancer Neg Hx    Sleep apnea Neg Hx     Current Outpatient Medications:    acetaminophen (TYLENOL) 500 MG tablet, Take 1 tablet (500 mg total) by mouth every 6 (six) hours as needed for mild pain or moderate pain., Disp: 30 tablet, Rfl: 0   cabergoline (DOSTINEX) 0.5 MG tablet, Take 0.5 tablets (0.25 mg total) by mouth 2 (two) times a week. (Patient taking differently: Take 0.5 mg by mouth 2 (two) times a week. Mon and Thurs Tablet is 1 mg), Disp: 19 tablet, Rfl: 2   cetirizine (ZYRTEC) 10 MG tablet, Take 10 mg by mouth daily., Disp: , Rfl:    Cholecalciferol  (VITAMIN D3) 50 MCG (2000 UT) capsule, Take 6,000 Units by mouth daily., Disp: , Rfl:    colestipol (COLESTID) 1 g tablet, Take 1 tablet (1 g total) by mouth 2 (two) times daily. (Patient taking differently: Take 1 g by mouth daily.), Disp: 180 tablet, Rfl: 4   Cyanocobalamin (B-12 PO), Take 1,000 mcg by mouth daily., Disp: , Rfl:    KLS ALLER-FLO 50 MCG/ACT nasal spray, Place 2 sprays into both nostrils daily., Disp: , Rfl:    methocarbamol (ROBAXIN) 500 MG tablet, Take 1 tablet (500 mg total) by mouth every 8 (eight) hours as needed for muscle spasms., Disp: 20 tablet, Rfl: 0   OVER THE COUNTER MEDICATION, SSS Tonic Takes 3 tbsp daily, Disp: , Rfl:    Prenatal Vit-Fe Fumarate-FA (MULTIVITAMIN-PRENATAL) 27-0.8 MG TABS tablet, Take 1 tablet by mouth daily at 12 noon., Disp: , Rfl:    Tetrahydrozoline-Zn Sulfate (ALLERGY RELIEF EYE DROPS OP), Place 1 drop into both eyes daily., Disp: , Rfl:    methylPREDNISolone (MEDROL DOSEPAK) 4 MG TBPK tablet, 6 day dose pack - take as directed (Patient not taking: Reported on 10/15/2021), Disp: 21 tablet, Rfl: 0   norethindrone (ORTHO MICRONOR) 0.35 MG tablet, Take 1 tablet (0.35 mg total) by mouth daily. (Patient not taking: Reported on 10/15/2021), Disp: 84 tablet, Rfl: 3  EXAM:  VITALS per patient if applicable:  RR between 12-20 bpm  GENERAL: alert, oriented, appears well and in no acute distress  HEENT: atraumatic, conjunctiva clear, no obvious abnormalities on inspection of external nose and ears  NECK: normal movements of the head and neck  LUNGS: on inspection no signs of respiratory distress, breathing rate appears normal, no obvious gross SOB, gasping or wheezing  CV: no obvious cyanosis  MS: moves all visible extremities without noticeable abnormality  PSYCH/NEURO: pleasant and cooperative, no obvious depression or anxiety, speech and thought processing grossly intact  ASSESSMENT AND PLAN:  Discussed the following assessment and  plan:  Iron deficiency anemia due to chronic blood loss -hgb 8.6 on 10/13/21 -Patient advised to keep follow-up appointment with heme-onc -Will look into scheduling  iron infusion/frequency -Given precautions  Other fatigue -possibly related to anemia.  Also consider depression, vitamin def., sleep apnea, etc. -Labs from 10/13/2021 reviewed -self care, diet, exercise encouraged  Abnormal uterine bleeding (AUB) -Endometrial thickening noted on ultrasound from ED -Continue follow-up with OB/GYN  Follow-up as needed   I discussed the assessment and treatment plan with the patient. The patient was provided an opportunity to ask questions and all were answered. The patient agreed with the plan and demonstrated an understanding of the instructions.   The patient was advised to call back or seek an in-person evaluation if the symptoms worsen  or if the condition fails to improve as anticipated.  [name], MD

## 2021-10-20 ENCOUNTER — Ambulatory Visit: Payer: 59 | Admitting: Obstetrics and Gynecology

## 2021-10-20 ENCOUNTER — Encounter: Payer: Self-pay | Admitting: Obstetrics and Gynecology

## 2021-10-20 VITALS — BP 120/84 | HR 89 | Ht 67.0 in | Wt 378.0 lb

## 2021-10-20 DIAGNOSIS — N939 Abnormal uterine and vaginal bleeding, unspecified: Secondary | ICD-10-CM

## 2021-10-20 LAB — PREGNANCY, URINE: Preg Test, Ur: NEGATIVE

## 2021-10-20 MED ORDER — NORETHINDRONE ACETATE 5 MG PO TABS: 5.0000 mg | ORAL_TABLET | Freq: Two times a day (BID) | ORAL | 1 refills | Status: DC

## 2021-10-26 NOTE — Progress Notes (Signed)
[facility]   Telephone:([phone] Fax:([phone]) [phone]   Clinic Follow up Note   Patient Care Team: [name], MD as PCP - General (Family Medicine) Nunzio Cobbs, MD as Consulting Physician (Obstetrics and Gynecology) 10/27/2021  CHIEF COMPLAINT: Follow up IDA  CURRENT THERAPY: IV Feraheme as needed  INTERVAL HISTORY: Ms. Ana Padilla returns for follow up as scheduled. Last seen by Dr. Burr Medico 07/31/21. She received IV iron on 08/14/21 and 08/21/21. Hgb improved to 11.1 on 09/11/21.  Around that time she switched from Megace to oral contraceptive.  Around 9/19 she had an extremely heavy period with large clots the size of the palm of her hand for up to 2 weeks.  She ran out of SSS tonic iron and changed to liquid iron during that time.  She developed her usual symptoms of anemia including extreme fatigue and headaches, as well as right pelvic pain, and went to ED 10/13/21. Labs there showed hgb down to 8.6. Saw Ob/gyn Dr. Quincy Simmonds 10/17 and was started on Aygestin.  Since then she has had no recurrent vaginal bleeding but is still tired with headaches.  Finds it difficult to stay awake to work.  She gained weight on Megace and would like to lose weight, but does not have the energy to exercise.  Denies other sources of bleeding such as epistaxis or hematochezia.  All other systems were reviewed with the patient and are negative.  MEDICAL HISTORY:  Past Medical History:  Diagnosis Date   Adenoma of pituitary ([place]) 08/2014   Anemia    Anxiety    Depression    Elevated ferritin level    G6PD deficiency 01/04/2021   Heel spur, left    IBS (irritable bowel syndrome)    Low iron    Morbid obesity ([place])    Seasonal allergies    Sickle cell trait ([place])    Vitamin D deficiency     SURGICAL HISTORY: Past Surgical History:  Procedure Laterality Date   ACHILLES TENDON LENGTHENING Left    BLEPHAROPLASTY Right    lower lid x 3   CHOLECYSTECTOMY  2016   DILATATION &  CURETTAGE/HYSTEROSCOPY WITH MYOSURE N/A 07/13/2021   Procedure: DILATATION & CURETTAGE/HYSTEROSCOPY;  Surgeon: Nunzio Cobbs, MD;  Location: [place];  Service: Gynecology;  Laterality: N/A;   ELECTROLYSIS OF MISDIRECTED LASHES Right 08/04/2017   Procedure: ELECTROLYSIS OF MISDIRECTED LASHES RIGHT EYE;  Surgeon: Clista Bernhardt, MD;  Location: [place];  Service: Ophthalmology;  Laterality: Right;   ENTROPIAN REPAIR Right 08/04/2017   Procedure: REPAIR OF ENTROPION WITH TARSAL STRIP RIGHT EYE;  Surgeon: Clista Bernhardt, MD;  Location: [facility];  Service: Ophthalmology;  Laterality: Right;   HEEL SPUR RESECTION Left    HYSTEROSCOPY WITH D & C N/A 06/27/2017   Procedure: DILATATION & CURETTAGE/HYSTEROSCOPY;  Surgeon: Nunzio Cobbs, MD;  Location: [facility];  Service: Gynecology;  Laterality: N/A;   iud placement     been removed   TENDON REPAIR Left 05/21/2020   Procedure: TENOLYSIS OF LEFT FOOT;  Surgeon: Evelina Bucy, DPM;  [facility];  Service: Podiatry;  Laterality: Left;    I have reviewed the social history and family history with the patient and they are unchanged from previous note.  ALLERGIES:  is allergic to food, pollinex-t [modified tree tyrosine adsorbate], and quinine.  MEDICATIONS:  Current Outpatient Medications  Medication Sig Dispense Refill   acetaminophen (TYLENOL) 500 MG tablet Take  1 tablet (500 mg total) by mouth every 6 (six) hours as needed for mild pain or moderate pain. 30 tablet 0   cabergoline (DOSTINEX) 0.5 MG tablet Take 0.5 tablets (0.25 mg total) by mouth 2 (two) times a week. (Patient taking differently: Take 0.5 mg by mouth 2 (two) times a week. Mon and Thurs Tablet is 1 mg) 19 tablet 2   cetirizine (ZYRTEC) 10 MG tablet Take 10 mg by mouth daily.     Cholecalciferol (VITAMIN D3) 50 MCG (2000 UT) capsule Take 6,000 Units by mouth daily.     colestipol (COLESTID) 1 g tablet Take 1 tablet (1 g total) by mouth 2 (two) times daily. (Patient  taking differently: Take 1 g by mouth daily.) 180 tablet 4   Cyanocobalamin (B-12 PO) Take 1,000 mcg by mouth daily.     Ferrous Sulfate (IRON PO) Take 125 mg by mouth daily.     KLS ALLER-FLO 50 MCG/ACT nasal spray Place 2 sprays into both nostrils daily.     norethindrone (AYGESTIN) 5 MG tablet Take 1 tablet (5 mg total) by mouth 2 (two) times daily. 180 tablet 1   OVER THE COUNTER MEDICATION SSS Tonic Takes 3 tbsp daily     Prenatal Vit-Fe Fumarate-FA (MULTIVITAMIN-PRENATAL) 27-0.8 MG TABS tablet Take 1 tablet by mouth daily at 12 noon.     Tetrahydrozoline-Zn Sulfate (ALLERGY RELIEF EYE DROPS OP) Place 1 drop into both eyes daily.     No current facility-administered medications for this visit.    PHYSICAL EXAMINATION: ECOG PERFORMANCE STATUS: 1 - Symptomatic but completely ambulatory  Vitals:   10/27/21 0919  BP: 132/89  Pulse: 94  Resp: 18  Temp: 98.9 F (37.2 C)  SpO2: 100%   Filed Weights   10/27/21 0919  Weight: (!) 376 lb 6.4 oz (170.7 kg)    GENERAL:alert, no distress and comfortable EYES: sclera clear LUNGS:  normal breathing effort HEART:  no lower extremity edema NEURO: alert & oriented x 3 with fluent speech, no focal motor/sensory deficits  LABORATORY DATA:  I have reviewed the data as listed    Latest Ref Rng & Units 10/13/2021    9:37 AM 09/11/2021    9:51 AM 07/31/2021    8:02 AM  CBC  WBC 4.0 - 10.5 K/uL 8.9  7.9  9.6   Hemoglobin 12.0 - 15.0 g/dL 8.6  11.1  10.1   Hematocrit 36.0 - 46.0 % 29.0  33.8  31.8   Platelets 150 - 400 K/uL 426  410.0  491         Latest Ref Rng & Units 10/13/2021    9:37 AM 09/11/2021    9:51 AM 03/19/2021    1:28 PM  CMP  Glucose 70 - 99 mg/dL 110  89  98   BUN 6 - 20 mg/dL '8  10  8   '$ Creatinine 0.44 - 1.00 mg/dL 0.78  0.76  0.72   Sodium 135 - 145 mmol/L 139  139  140   Potassium 3.5 - 5.1 mmol/L 3.7  3.8  3.9   Chloride 98 - 111 mmol/L 106  103  104   CO2 22 - 32 mmol/L '23  27  29   '$ Calcium 8.9 - 10.3 mg/dL  9.2  10.2  9.4   Total Protein 6.5 - 8.1 g/dL   7.8   Total Bilirubin 0.3 - 1.2 mg/dL   0.4   Alkaline Phos 38 - 126 U/L   72   AST 15 -  41 U/L   26   ALT 0 - 44 U/L   19       RADIOGRAPHIC STUDIES: I have personally reviewed the radiological images as listed and agreed with the findings in the report. No results found.   ASSESSMENT & PLAN: 36 yo female    Iron deficiency anemia, secondary to menorrhagia -She has chronic IDA from menorrhagia, she has tried OCPs and oral iron in the past which did not control IDA well, except when anemia resolved in 11/2017 - 08/2019 -She received IV Feraheme q1-3 months from 2018 to 2019, then started SSS tonic which is the only iron supplement she tolerates -Has not had GI work up (Dr. Scarlette Shorts) -She developed acute on chronic anemia with Hgb 9.9 on 02/23/21 and resumed IV Feraheme weekly x2 in 03/2021 and 08/2021 -Ms. Ana Padilla appears stable. She is symptomatic of anemia and continues to have menorrhagia, working with ob/gyn to control. IUDs are very painful. She gained weight on Megace -She had an extremely heavy bleed x2 weeks while switching from megace to OCP in 09/2021 and was found to have hgb 8.6 on ED visit for pelvic pain on 10/10.  -Currently on Aygestin, no bleeding since LMP in 09/2021 -Will check iron panel Friday when she returns for IV Feraheme 10/27, then second dose next week -Lab in 4-6 weeks, then lab and f/up in 4 months with additional IV iron as needed -Continue oral iron (either SSS tonic or liquid Fe, whichever she can tolerate better)   Microcytosis, sickle cell trait -She has chronically low MCV 60-70 range, even when hgb is normal, which suggests thalassemia.  -previous hemoglobinopathy panel in 2018 showed elevated Hgb A2 4.4, elevated Hgb S 31, and low Hgb A 64.6 Which is consistent with sickle cell trait (heterozygous). 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 She denies recent infection -We recommend to take prenatal vitamin which contains folate, B12, iron   Health maintenance, weight loss  -She had several foot surgeries which has limited her exercise for the past year, she plans to resume -she previously saw medical weight loss management, but the plan was not sustainable for her family at the time.  -She gained weight on Megace to control menorrhagia, she is off now and highly motivated to lose weight. She wants to be healthy when she gets pregnant   Anxiety, depression, pituitary adenoma, hyperprolactinemia  -per PCP, endo  PLAN: -ED course and ob/gyn notes reviewed -lab and IV Feraheme 10/27, second dose next week -Lab in 4-6 weeks -Lab and f/up with me in 4 months, or sooner if needed   All questions were answered. The patient knows to call the clinic with any problems, questions or concerns. No barriers to learning was detected.     Alla Feeling, NP 10/27/21

## 2021-10-27 ENCOUNTER — Inpatient Hospital Stay: Payer: 59 | Attending: Hematology | Admitting: Nurse Practitioner

## 2021-10-27 ENCOUNTER — Encounter: Payer: Self-pay | Admitting: Nurse Practitioner

## 2021-10-27 VITALS — BP 132/89 | HR 94 | Temp 98.9°F | Resp 18 | Ht 67.0 in | Wt 376.4 lb

## 2021-10-27 DIAGNOSIS — D573 Sickle-cell trait: Secondary | ICD-10-CM | POA: Diagnosis not present

## 2021-10-27 DIAGNOSIS — D563 Thalassemia minor: Secondary | ICD-10-CM

## 2021-10-27 DIAGNOSIS — N920 Excessive and frequent menstruation with regular cycle: Secondary | ICD-10-CM | POA: Insufficient documentation

## 2021-10-27 DIAGNOSIS — D500 Iron deficiency anemia secondary to blood loss (chronic): Secondary | ICD-10-CM | POA: Diagnosis present

## 2021-10-30 ENCOUNTER — Inpatient Hospital Stay: Payer: 59

## 2021-10-30 ENCOUNTER — Other Ambulatory Visit: Payer: Self-pay

## 2021-10-30 VITALS — BP 116/85 | HR 94 | Temp 99.0°F | Resp 18

## 2021-10-30 DIAGNOSIS — D649 Anemia, unspecified: Secondary | ICD-10-CM

## 2021-10-30 DIAGNOSIS — D500 Iron deficiency anemia secondary to blood loss (chronic): Secondary | ICD-10-CM

## 2021-10-30 DIAGNOSIS — E538 Deficiency of other specified B group vitamins: Secondary | ICD-10-CM

## 2021-10-30 LAB — CBC WITH DIFFERENTIAL (CANCER CENTER ONLY)
Abs Immature Granulocytes: 0.02 10*3/uL (ref 0.00–0.07)
Basophils Absolute: 0 10*3/uL (ref 0.0–0.1)
Basophils Relative: 0 %
Eosinophils Absolute: 0.2 10*3/uL (ref 0.0–0.5)
Eosinophils Relative: 3 %
HCT: 30.5 % — ABNORMAL LOW (ref 36.0–46.0)
Hemoglobin: 9.6 g/dL — ABNORMAL LOW (ref 12.0–15.0)
Immature Granulocytes: 0 %
Lymphocytes Relative: 21 %
Lymphs Abs: 1.7 10*3/uL (ref 0.7–4.0)
MCH: 22.8 pg — ABNORMAL LOW (ref 26.0–34.0)
MCHC: 31.5 g/dL (ref 30.0–36.0)
MCV: 72.4 fL — ABNORMAL LOW (ref 80.0–100.0)
Monocytes Absolute: 0.3 10*3/uL (ref 0.1–1.0)
Monocytes Relative: 4 %
Neutro Abs: 6 10*3/uL (ref 1.7–7.7)
Neutrophils Relative %: 72 %
Platelet Count: 439 10*3/uL — ABNORMAL HIGH (ref 150–400)
RBC: 4.21 MIL/uL (ref 3.87–5.11)
RDW: 18.1 % — ABNORMAL HIGH (ref 11.5–15.5)
WBC Count: 8.3 10*3/uL (ref 4.0–10.5)
nRBC: 0 % (ref 0.0–0.2)

## 2021-10-30 LAB — IRON AND IRON BINDING CAPACITY (CC-WL,HP ONLY)
Iron: 36 ug/dL (ref 28–170)
Saturation Ratios: 11 % (ref 10.4–31.8)
TIBC: 318 ug/dL (ref 250–450)
UIBC: 282 ug/dL (ref 148–442)

## 2021-10-30 LAB — FERRITIN: Ferritin: 90 ng/mL (ref 11–307)

## 2021-10-30 LAB — RETIC PANEL
Immature Retic Fract: 23 % — ABNORMAL HIGH (ref 2.3–15.9)
RBC.: 4.3 MIL/uL (ref 3.87–5.11)
Retic Count, Absolute: 92 10*3/uL (ref 19.0–186.0)
Retic Ct Pct: 2.1 % (ref 0.4–3.1)
Reticulocyte Hemoglobin: 24.2 pg — ABNORMAL LOW (ref >27.9)

## 2021-10-30 MED ORDER — SODIUM CHLORIDE 0.9 % IV SOLN
510.0000 mg | Freq: Once | INTRAVENOUS | Status: AC
  Administered 2021-10-30: 510 mg via INTRAVENOUS
  Filled 2021-10-30: qty 510

## 2021-10-30 MED ORDER — SODIUM CHLORIDE 0.9 % IV SOLN: Freq: Once | INTRAVENOUS | Status: AC

## 2021-10-30 NOTE — Patient Instructions (Signed)

## 2021-10-30 NOTE — Progress Notes (Signed)
Pt declined to stay for post 30 obs, discharged ambulatory to lobby with VSS.

## 2021-11-02 ENCOUNTER — Encounter: Payer: Self-pay | Admitting: Family Medicine

## 2021-11-06 ENCOUNTER — Inpatient Hospital Stay: Payer: 59 | Attending: Nurse Practitioner

## 2021-11-06 VITALS — BP 118/78 | HR 78 | Temp 98.2°F | Resp 18

## 2021-11-06 DIAGNOSIS — N920 Excessive and frequent menstruation with regular cycle: Secondary | ICD-10-CM | POA: Diagnosis present

## 2021-11-06 DIAGNOSIS — D500 Iron deficiency anemia secondary to blood loss (chronic): Secondary | ICD-10-CM | POA: Insufficient documentation

## 2021-11-06 MED ORDER — SODIUM CHLORIDE 0.9 % IV SOLN
510.0000 mg | Freq: Once | INTRAVENOUS | Status: AC
  Administered 2021-11-06: 510 mg via INTRAVENOUS
  Filled 2021-11-06: qty 510

## 2021-11-06 NOTE — Progress Notes (Signed)
Patient declined 30  minute wait vss upon discharge.

## 2021-11-11 ENCOUNTER — Other Ambulatory Visit: Payer: Self-pay | Admitting: Podiatry

## 2021-11-11 ENCOUNTER — Telehealth: Payer: Self-pay | Admitting: *Deleted

## 2021-11-11 ENCOUNTER — Other Ambulatory Visit: Payer: 59

## 2021-11-11 MED ORDER — METHYLPREDNISOLONE 4 MG PO TBPK: ORAL_TABLET | ORAL | 0 refills | Status: DC

## 2021-11-11 NOTE — Telephone Encounter (Signed)
Patient is calling for a refill on methylprednisolone, having a PF flare up , issues with walking, unable to schedule appointment at the moment, please advise.

## 2021-11-11 NOTE — Telephone Encounter (Signed)
Refill sent to the pharmacy.  Please notify patient.  Thanks, Dr. Amalia Hailey

## 2021-11-12 NOTE — Telephone Encounter (Signed)
Patient notified thru voice message

## 2021-11-16 ENCOUNTER — Ambulatory Visit (INDEPENDENT_AMBULATORY_CARE_PROVIDER_SITE_OTHER): Payer: 59 | Admitting: *Deleted

## 2021-11-16 DIAGNOSIS — M722 Plantar fascial fibromatosis: Secondary | ICD-10-CM | POA: Diagnosis not present

## 2021-11-16 NOTE — Progress Notes (Signed)
Patient presents today to be casted for custom molded orthotics. Dr. Amalia Hailey has been treating patient for plantar fasciitis.   Impression foam cast was taken. ABN signed.  Patient info-  Shoe size: 12  Shoe style: Sneakers  Weight: 375  Insurance: [employer]   Patient will be notified once orthotics arrive in office and reappoint for fitting at that time.

## 2021-11-30 ENCOUNTER — Ambulatory Visit: Payer: 59 | Admitting: Podiatry

## 2021-12-01 ENCOUNTER — Other Ambulatory Visit: Payer: 59

## 2021-12-01 ENCOUNTER — Inpatient Hospital Stay: Payer: 59

## 2021-12-03 ENCOUNTER — Inpatient Hospital Stay: Payer: 59

## 2021-12-03 DIAGNOSIS — D649 Anemia, unspecified: Secondary | ICD-10-CM

## 2021-12-03 DIAGNOSIS — E538 Deficiency of other specified B group vitamins: Secondary | ICD-10-CM

## 2021-12-03 DIAGNOSIS — D500 Iron deficiency anemia secondary to blood loss (chronic): Secondary | ICD-10-CM

## 2021-12-03 LAB — CBC WITH DIFFERENTIAL (CANCER CENTER ONLY)
Abs Immature Granulocytes: 0.02 10*3/uL (ref 0.00–0.07)
Basophils Absolute: 0 10*3/uL (ref 0.0–0.1)
Basophils Relative: 0 %
Eosinophils Absolute: 0.2 10*3/uL (ref 0.0–0.5)
Eosinophils Relative: 2 %
HCT: 36.4 % (ref 36.0–46.0)
Hemoglobin: 11.3 g/dL — ABNORMAL LOW (ref 12.0–15.0)
Immature Granulocytes: 0 %
Lymphocytes Relative: 19 %
Lymphs Abs: 1.6 10*3/uL (ref 0.7–4.0)
MCH: 22.6 pg — ABNORMAL LOW (ref 26.0–34.0)
MCHC: 31 g/dL (ref 30.0–36.0)
MCV: 72.9 fL — ABNORMAL LOW (ref 80.0–100.0)
Monocytes Absolute: 0.4 10*3/uL (ref 0.1–1.0)
Monocytes Relative: 4 %
Neutro Abs: 6.1 10*3/uL (ref 1.7–7.7)
Neutrophils Relative %: 75 %
Platelet Count: 432 10*3/uL — ABNORMAL HIGH (ref 150–400)
RBC: 4.99 MIL/uL (ref 3.87–5.11)
RDW: 16.9 % — ABNORMAL HIGH (ref 11.5–15.5)
WBC Count: 8.3 10*3/uL (ref 4.0–10.5)
nRBC: 0 % (ref 0.0–0.2)

## 2021-12-03 LAB — IRON AND IRON BINDING CAPACITY (CC-WL,HP ONLY)
Iron: 40 ug/dL (ref 28–170)
Saturation Ratios: 11 % (ref 10.4–31.8)
TIBC: 354 ug/dL (ref 250–450)
UIBC: 314 ug/dL (ref 148–442)

## 2021-12-03 LAB — FERRITIN: Ferritin: 324 ng/mL — ABNORMAL HIGH (ref 11–307)

## 2021-12-03 LAB — RETIC PANEL
Immature Retic Fract: 15.4 % (ref 2.3–15.9)
RBC.: 5.06 MIL/uL (ref 3.87–5.11)
Retic Count, Absolute: 57.7 10*3/uL (ref 19.0–186.0)
Retic Ct Pct: 1.1 % (ref 0.4–3.1)
Reticulocyte Hemoglobin: 25.7 pg — ABNORMAL LOW (ref >27.9)

## 2021-12-04 ENCOUNTER — Encounter: Payer: Self-pay | Admitting: Nurse Practitioner

## 2021-12-07 ENCOUNTER — Ambulatory Visit: Payer: 59 | Admitting: Podiatry

## 2021-12-24 ENCOUNTER — Ambulatory Visit: Payer: 59 | Admitting: *Deleted

## 2021-12-24 DIAGNOSIS — M722 Plantar fascial fibromatosis: Secondary | ICD-10-CM

## 2021-12-24 NOTE — Progress Notes (Signed)
Patient presents today to pick up custom molded foot orthotics recommended by Dr. Amalia Hailey.   Orthotics were dispensed and fit was satisfactory. Reviewed instructions for break-in and wear. Written instructions given to patient.  Patient will follow up as needed.   [facility] - order # [id number]

## 2022-01-12 ENCOUNTER — Other Ambulatory Visit: Payer: Self-pay | Admitting: Internal Medicine

## 2022-01-12 DIAGNOSIS — D352 Benign neoplasm of pituitary gland: Secondary | ICD-10-CM

## 2022-01-15 ENCOUNTER — Telehealth: Payer: 59 | Admitting: Family Medicine

## 2022-01-15 ENCOUNTER — Encounter: Payer: Self-pay | Admitting: Family Medicine

## 2022-01-15 VITALS — Ht 67.0 in | Wt 376.0 lb

## 2022-01-15 DIAGNOSIS — F900 Attention-deficit hyperactivity disorder, predominantly inattentive type: Secondary | ICD-10-CM | POA: Diagnosis not present

## 2022-01-15 DIAGNOSIS — F411 Generalized anxiety disorder: Secondary | ICD-10-CM

## 2022-01-15 DIAGNOSIS — D352 Benign neoplasm of pituitary gland: Secondary | ICD-10-CM

## 2022-01-15 NOTE — Progress Notes (Signed)
Virtual Visit via Video Note  I connected with Ana Padilla on 01/15/22 at  2:00 PM EST by a video enabled telemedicine application 2/2 [id number] pandemic and verified that I am speaking with the correct person using two identifiers.  Location patient: home Location provider:work or home office Persons participating in the virtual visit: patient, provider  I discussed the limitations of evaluation and management by telemedicine and the availability of in person appointments. The patient expressed understanding and agreed to proceed.  HPI: Patient is a 37 year old female with pmh sig for adenoma pituitary, anxiety, depression, G6PD deficiency, anemia, vitamin D deficiency, IBS, obesity who was seen for acute concern.  Pt notes increased anxiety in the last few days.  Increased crying, upset.  Has been thinking more about the future as she will be 37 yo.  Patient is in counseling.  Plans on starting yoga.  Nothing has changed that could have caused increased symptoms.  Pt dx'd with ADHD.  Started on Vyvanse 20 mg a few months ago.  Feels more like herself, less confused, not overwhelmed, and more focused while on the medication.   ROS: See pertinent positives and negatives per HPI.  Past Medical History:  Diagnosis Date   Adenoma of pituitary ([place]) 08/2014   Anemia    Anxiety    Depression    Elevated ferritin level    G6PD deficiency 01/04/2021   Heel spur, left    IBS (irritable bowel syndrome)    Low iron    Morbid obesity ([place])    Seasonal allergies    Sickle cell trait ([place])    Vitamin D deficiency     Past Surgical History:  Procedure Laterality Date   ACHILLES TENDON LENGTHENING Left    BLEPHAROPLASTY Right    lower lid x 3   CHOLECYSTECTOMY  2016   DILATATION & CURETTAGE/HYSTEROSCOPY WITH MYOSURE N/A 07/13/2021   Procedure: DILATATION & CURETTAGE/HYSTEROSCOPY;  Surgeon: Nunzio Cobbs, MD;  Location: [facility];  Service: Gynecology;  Laterality: N/A;    ELECTROLYSIS OF MISDIRECTED LASHES Right 08/04/2017   Procedure: ELECTROLYSIS OF MISDIRECTED LASHES RIGHT EYE;  Surgeon: Clista Bernhardt, MD;  Location: [facility];  Service: Ophthalmology;  Laterality: Right;   ENTROPIAN REPAIR Right 08/04/2017   Procedure: REPAIR OF ENTROPION WITH TARSAL STRIP RIGHT EYE;  Surgeon: Clista Bernhardt, MD;  Location: [facility];  Service: Ophthalmology;  Laterality: Right;   HEEL SPUR RESECTION Left    HYSTEROSCOPY WITH D & C N/A 06/27/2017   Procedure: DILATATION & CURETTAGE/HYSTEROSCOPY;  Surgeon: Nunzio Cobbs, MD;  Location: [facility];  Service: Gynecology;  Laterality: N/A;   iud placement     been removed   TENDON REPAIR Left 05/21/2020   Procedure: TENOLYSIS OF LEFT FOOT;  Surgeon: Evelina Bucy, DPM;  [facility];  Service: Podiatry;  Laterality: Left;    Family History  Adopted: Yes  Problem Relation Age of Onset   Sickle cell trait Mother    Heart disease Father    Obesity Father    Colon cancer Neg Hx    Esophageal cancer Neg Hx    Stomach cancer Neg Hx    Sleep apnea Neg Hx       Current Outpatient Medications:    acetaminophen (TYLENOL) 500 MG tablet, Take 1 tablet (500 mg total) by mouth every 6 (six) hours as needed for mild pain or moderate pain., Disp: 30 tablet, Rfl: 0   cabergoline (DOSTINEX) 0.5 MG tablet,  TAKE HALF  TABLET  BY MOUTH TWICE A WEEK, Disp: 19 tablet, Rfl: 0   cetirizine (ZYRTEC) 10 MG tablet, Take 10 mg by mouth daily., Disp: , Rfl:    Cholecalciferol (VITAMIN D3) 50 MCG (2000 UT) capsule, Take 6,000 Units by mouth daily., Disp: , Rfl:    colestipol (COLESTID) 1 g tablet, Take 1 tablet (1 g total) by mouth 2 (two) times daily. (Patient taking differently: Take 1 g by mouth daily.), Disp: 180 tablet, Rfl: 4   Cyanocobalamin (B-12 PO), Take 1,000 mcg by mouth daily., Disp: , Rfl:    Ferrous Sulfate (IRON PO), Take 125 mg by mouth daily., Disp: , Rfl:    KLS ALLER-FLO 50 MCG/ACT nasal spray, Place 2 sprays into  both nostrils daily., Disp: , Rfl:    norethindrone (AYGESTIN) 5 MG tablet, Take 1 tablet (5 mg total) by mouth 2 (two) times daily., Disp: 180 tablet, Rfl: 1   OVER THE COUNTER MEDICATION, SSS Tonic Takes 3 tbsp daily, Disp: , Rfl:    Prenatal Vit-Fe Fumarate-FA (MULTIVITAMIN-PRENATAL) 27-0.8 MG TABS tablet, Take 1 tablet by mouth daily at 12 noon., Disp: , Rfl:    Tetrahydrozoline-Zn Sulfate (ALLERGY RELIEF EYE DROPS OP), Place 1 drop into both eyes daily., Disp: , Rfl:    VYVANSE 20 MG capsule, Take 20 mg by mouth daily., Disp: , Rfl:   EXAM:   VITALS per patient if applicable:  RR between 12-20 bpm  GENERAL: alert, oriented, appears well and in no acute distress  HEENT: atraumatic, conjunctiva clear, no obvious abnormalities on inspection of external nose and ears  NECK: normal movements of the head and neck  LUNGS: on inspection no signs of respiratory distress, breathing rate appears normal, no obvious gross SOB, gasping or wheezing  CV: no obvious cyanosis  MS: moves all visible extremities without noticeable abnormality  PSYCH/NEURO: pleasant and cooperative, no obvious depression or anxiety, speech and thought processing grossly intact  ASSESSMENT AND PLAN:  Discussed the following assessment and plan:  GAD (generalized anxiety disorder) -increased -Continue self care and counseling.  Encouraged to start yoga or other exercise routine.   -Consider medication options freaks to worsen symptoms -Given strict precautions  Attention deficit hyperactivity disorder (ADHD), predominantly inattentive type -continue vyvanse 20 mg daily -PDMP reviewed -continue f/u with [facility]  Pituitary adenoma ([place]) -stable -continue cabergoline -continue f/u with Endo   F/u prn   I discussed the assessment and treatment plan with the patient. The patient was provided an opportunity to ask questions and all were answered. The patient agreed with the plan and demonstrated an understanding of  the instructions.   The patient was advised to call back or seek an in-person evaluation if the symptoms worsen or if the condition fails to improve as anticipated.   [name], MD

## 2022-02-01 ENCOUNTER — Inpatient Hospital Stay: Payer: 59

## 2022-02-01 ENCOUNTER — Inpatient Hospital Stay: Payer: 59 | Admitting: Hematology

## 2022-02-24 ENCOUNTER — Ambulatory Visit: Payer: 59 | Admitting: Hematology

## 2022-02-24 ENCOUNTER — Other Ambulatory Visit: Payer: 59

## 2022-02-25 ENCOUNTER — Ambulatory Visit: Payer: 59 | Admitting: Internal Medicine

## 2022-02-25 ENCOUNTER — Encounter: Payer: Self-pay | Admitting: Internal Medicine

## 2022-02-25 VITALS — BP 120/72 | HR 110 | Ht 67.0 in | Wt 372.4 lb

## 2022-02-25 DIAGNOSIS — E221 Hyperprolactinemia: Secondary | ICD-10-CM | POA: Diagnosis not present

## 2022-02-25 DIAGNOSIS — D352 Benign neoplasm of pituitary gland: Secondary | ICD-10-CM | POA: Diagnosis not present

## 2022-02-25 DIAGNOSIS — N912 Amenorrhea, unspecified: Secondary | ICD-10-CM | POA: Diagnosis not present

## 2022-02-25 NOTE — Patient Instructions (Addendum)
Please stop at the lab.  Please continue Cabergoline 0.25 mg 2x weekly.  If you get pregnant, stop Cabergoline.  You should have an endocrinology follow-up appointment in 1 year.

## 2022-02-25 NOTE — Progress Notes (Signed)
Patient ID: Ana Padilla, female   DOB: 1985/01/11, 37 y.o.   MRN: [id number]   HPI  Ana Padilla is a 37 y.o.-year-old female, initially referred by her PCP, Dr. Volanda Napoleon, presenting for follow-up for pituitary adenoma and hyperprolactinemia.  Last visit 2 years and 6 months ago.  Interim history: She continues to be seen in the [facility] weight management clinic. She was just dx'ed with ADHD >> started Vyvanse - feels "like a new person". She gained a lot of weight recently -per our records, IBS-D, 30 pounds since our last in person visit in 2020.  Reviewed and addended history: She was diagnosed with a pituitary adenoma in 2014 during investigation for amenorrhea.  At that time, prolactin level was 43.  She ruled out for adrenal insufficiency and Cushing's syndrome then. She also ruled out for PCOS.   Reviewed records from previous endocrinologist: Received records from [facility] (Dr. Veryl Speak) >> patient's pituitary tumor was a microadenoma.  It decreased in size from 2015 to 2017:  MRI brain (07/29/2015): The previously reported hypoenhancing pituitary nodule, grossly, is slightly smaller, measuring 2.4 mm (previously 4 mm).  The pituitary gland is normal in size.  The infundibulum is midline.  The optic chiasm is normal.  After 1 month of Cabergoline use in 08/2015, her prolactin normalized and she restarted to have menstrual cycles.  Also, she has a history of adrenal insufficiency due to failed cosyntropin stimulation test in 2015, however, this was performed after 1 month use of dexamethasone and when retested by Dr. Yolonda Kida, her [place] and cortisol were within normal limits in 2017.  After her diagnosis of adrenal insufficiency, she was on hydrocortisone for 6 months with significant weight gain. Dr. Yolonda Kida strongly recommended against restarting corticosteroids.  She was started on Cabergoline in 2017 and at last visit she was taking 0.25 mg twice a week.  Her menses have been  irregular but became regular after starting Cabergoline, however, she moved to [place] afterwards and was off Cabergoline for 7 months, during which she started to have amenorrhea again.  She restarted this in 10/2016.  In 02/2017, PRL level was excellent, at 4.5 and I advised her to decrease the dose of Cabergoline to 0.25 once a week only.  In In 07/2017, PRL was still normal at 8.3.  She was lost for follow-up afterwards and she stopped Cabergoline in the fall 2019... A prolactin level obtained in spring/2020 was very high.  This was obtained to a preconception counseling.  She mentioned that she started to have some breast fullness, amenorrhea, and headaches, but no galactorrhea after stopping Cabergoline.   We restarted Cabergoline in 04/2018.  No nausea, dizziness, headaches, from Cabergoline.  She previously had headaches in the past but these resolved after new glasses and blepharoplasty.  MRI brain (07/17/2018): Right-sided pituitary adenoma with cavernous sinus extension measuring 8 mm in diameter. (This was checked after a period of time off Cabergoline)  At our visit from 2021, we continued Cabergoline 0.25 mg weekly.  However, at this visit, she tells me that she is taking it twice a week.  Reviewed prolactin levels: Lab Results  Component Value Date   PROLACTIN 5.1 06/25/2021   PROLACTIN 6.8 08/09/2019   PROLACTIN 3.0 07/24/2018   PROLACTIN 62.3 (H) 05/02/2018   PROLACTIN 8.3 07/04/2017   PROLACTIN 4.5 02/11/2017   PROLACTIN 54.6 (H) 09/20/2016  03/04/2016: Prolactin 5.16, TSH 1.113 09/30/2015: Prolactin 21.3  07/29/2015: Prolactin 51.4  We checked the rest of  her pituitary hormones and these were normal: Component     Latest Ref Rng & Units 02/11/2017  IGF-I, LC/MS     63 - 373 ng/mL 93  Z-Score (Female)     -2.0 - 2 SD -1.1  LH     mIU/mL 15.95  Cortisol - AM     mcg/dL 12.1  C206 ACTH     6 - 50 pg/mL 16   Lab Results  Component Value Date   TSH 1.61  09/11/2021   TSH 1.80 12/10/2020   TSH 2.04 10/17/2020   TSH 1.24 03/19/2020   TSH 1.61 08/09/2019   TSH 0.906 02/19/2019   TSH 1.19 09/20/2016   FREET4 0.77 09/11/2021   FREET4 0.78 12/10/2020   FREET4 0.65 10/17/2020   FREET4 0.77 03/19/2020   FREET4 1.1 08/09/2019   FREET4 1.08 02/19/2019   FREET4 0.68 09/20/2016    07/29/2015: Cortisol 12.9, ACTH 29, IGF-I 121 (63-373)  She has a history of anemia, abnormal uterine bleeding, GAD, vitamin D deficiency,  ROS: + See HPI  Past Medical History:  Diagnosis Date   Adenoma of pituitary ([place]) 08/2014   Anemia    Anxiety    Depression    Elevated ferritin level    G6PD deficiency 01/04/2021   Heel spur, left    IBS (irritable bowel syndrome)    Low iron    Morbid obesity ([place])    Seasonal allergies    Sickle cell trait ([place])    Vitamin D deficiency    Past Surgical History:  Procedure Laterality Date   ACHILLES TENDON LENGTHENING Left    BLEPHAROPLASTY Right    lower lid x 3   CHOLECYSTECTOMY  2016   DILATATION & CURETTAGE/HYSTEROSCOPY WITH MYOSURE N/A 07/13/2021   Procedure: DILATATION & CURETTAGE/HYSTEROSCOPY;  Surgeon: Nunzio Cobbs, MD;  Location: [place];  Service: Gynecology;  Laterality: N/A;   ELECTROLYSIS OF MISDIRECTED LASHES Right 08/04/2017   Procedure: ELECTROLYSIS OF MISDIRECTED LASHES RIGHT EYE;  Surgeon: Clista Bernhardt, MD;  Location: [facility];  Service: Ophthalmology;  Laterality: Right;   ENTROPIAN REPAIR Right 08/04/2017   Procedure: REPAIR OF ENTROPION WITH TARSAL STRIP RIGHT EYE;  Surgeon: Clista Bernhardt, MD;  Location: [place];  Service: Ophthalmology;  Laterality: Right;   HEEL SPUR RESECTION Left    HYSTEROSCOPY WITH D & C N/A 06/27/2017   Procedure: DILATATION & CURETTAGE/HYSTEROSCOPY;  Surgeon: Nunzio Cobbs, MD;  Location: [facility];  Service: Gynecology;  Laterality: N/A;   iud placement     been removed   TENDON REPAIR Left 05/21/2020   Procedure: TENOLYSIS OF LEFT FOOT;   Surgeon: Evelina Bucy, DPM;  Location: [facility];  Service: Podiatry;  Laterality: Left;   Social History   Socioeconomic History   Marital status: Single    Spouse name: Not on file   Number of children: 0  Social Needs  Occupational History    Scheduler  Tobacco Use   Smoking status: Never Smoker   Smokeless tobacco: Never Used  Substance and Sexual Activity   Alcohol use: Yes    Comment: socially    Drug use: No   Current Outpatient Medications on File Prior to Visit  Medication Sig Dispense Refill   acetaminophen (TYLENOL) 500 MG tablet Take 1 tablet (500 mg total) by mouth every 6 (six) hours as needed for mild pain or moderate pain. 30 tablet 0   cabergoline (DOSTINEX) 0.5 MG tablet TAKE HALF  TABLET  BY MOUTH TWICE A WEEK 19 tablet 0   cetirizine (ZYRTEC) 10 MG tablet Take 10 mg by mouth daily.     Cholecalciferol (VITAMIN D3) 50 MCG (2000 UT) capsule Take 6,000 Units by mouth daily.     colestipol (COLESTID) 1 g tablet Take 1 tablet (1 g total) by mouth 2 (two) times daily. (Patient taking differently: Take 1 g by mouth daily.) 180 tablet 4   Cyanocobalamin (B-12 PO) Take 1,000 mcg by mouth daily.     Ferrous Sulfate (IRON PO) Take 125 mg by mouth daily.     KLS ALLER-FLO 50 MCG/ACT nasal spray Place 2 sprays into both nostrils daily.     norethindrone (AYGESTIN) 5 MG tablet Take 1 tablet (5 mg total) by mouth 2 (two) times daily. 180 tablet 1   OVER THE COUNTER MEDICATION SSS Tonic Takes 3 tbsp daily     Prenatal Vit-Fe Fumarate-FA (MULTIVITAMIN-PRENATAL) 27-0.8 MG TABS tablet Take 1 tablet by mouth daily at 12 noon.     Tetrahydrozoline-Zn Sulfate (ALLERGY RELIEF EYE DROPS OP) Place 1 drop into both eyes daily.     VYVANSE 20 MG capsule Take 20 mg by mouth daily.     No current facility-administered medications on file prior to visit.   Allergies  Allergen Reactions   Food Other (See Comments)    Seasonal Allergies & fresh fruit---itchy mouth&nose    Pollinex-T  [Modified Tree Tyrosine Adsorbate]     Itchy nose and eyes, ears, runny nose,    Quinine Other (See Comments)    g6pd deficiency   Family History  Adopted: Yes  Problem Relation Age of Onset   Sickle cell trait Mother    Heart disease Father    Obesity Father    Colon cancer Neg Hx    Esophageal cancer Neg Hx    Stomach cancer Neg Hx    Sleep apnea Neg Hx   Also, hypertension in father  PE: BP 120/72 (BP Location: Right Arm, Patient Position: Sitting, Cuff Size: Normal)   Pulse (!) 110   Ht '5\' 7"'$  (1.702 m)   Wt (!) 372 lb 6.4 oz (168.9 kg)   SpO2 99%   BMI 58.33 kg/m  Wt Readings from Last 3 Encounters:  02/25/22 (!) 372 lb 6.4 oz (168.9 kg)  01/15/22 (!) 376 lb (170.6 kg)  10/27/21 (!) 376 lb 6.4 oz (170.7 kg)   Constitutional: overweight, in NAD Eyes:  EOMI, no exophthalmos ENT: no neck masses, no cervical lymphadenopathy Cardiovascular: RRR, No MRG Respiratory: CTA B Musculoskeletal: no deformities Skin:no rashes Neurological: no tremor with outstretched hands  ASSESSMENT: 1. Pituitary adenoma  2.  Hyperprolactinemia  3.  Amenorrhea  PLAN:  1. And 2.  Patient with a history of pituitary adenoma, incidentally found during investigation for amenorrhea.  I did not have records or images of her initial pituitary MRI, but per her report, this was a macroadenoma.  Her prolactin was high at that time, suspicious for a prolactinoma but the prolactin levels were too low for a microadenoma. We discussed about the possibility of stalk compression from a pituitary tumor which may not be a prolactinoma, but also the possibility of hook effect of a falsely low prolactin due to assay interference from a very high prolactin level in blood.  However, I received his records after our initial visit and the pituitary tumor was actually a microadenoma.  In this case, her prolactin levels were as expected for a small tumor. -She was also Cabergoline  initially, 0.25 mg twice a week, which  we decreased to 0.25 mg once a week, however, she came off Cabergoline in the fall 2019.  The subsequent prolactin obtained during preconception counseling was high, at 62.3.  She had amenorrhea at that time.  An MRI obtained in the same year showed an increase in the size of the tumor, measuring 8 mm and extending in the cavernous sinus.  We restarted Cabergoline at that time. -Prolactin levels normalized afterwards -She was lost for follow-up after our visit from 2021 -However, she has repeated prolactin levels since and they were normal -She tolerates Cabergoline well, without headaches, nausea, dizziness.  However, upon questioning, she is still on 0.25 mg twice a week. -For now we decided to continue the same dose -She would like a pregnancy, but probably next year.  We discussed to stay on the Cabergoline and if she gets pregnant and then to stop it. -We may need to repeat her pituitary MRI at next visit.  3.  Amenorrhea -Possibly related to high prolactin in the setting of stopping Cabergoline treatment in the past -Currently on progestin only contraceptive -She has questions about the estrogen dominant type.  I directed her to OB/GYN.  She is currently on progestin only as she could not tolerate estrogen in the past.  Orders Placed This Encounter  Procedures   Prolactin   Component     Latest Ref Rng 02/25/2022  Prolactin     ng/mL 8.0   PRL Level is normal.  We can try to decrease the Cabergoline dose to only once a week and recheck the prolactin in 1.5 months.  Philemon Kingdom, MD PhD [facility]

## 2022-02-26 ENCOUNTER — Inpatient Hospital Stay: Payer: 59 | Admitting: Hematology

## 2022-02-26 ENCOUNTER — Inpatient Hospital Stay: Payer: 59

## 2022-02-26 LAB — PROLACTIN: Prolactin: 8 ng/mL

## 2022-02-26 MED ORDER — CABERGOLINE 0.5 MG PO TABS: ORAL_TABLET | ORAL | 3 refills | Status: DC

## 2022-03-09 NOTE — Progress Notes (Unsigned)
GYNECOLOGY  VISIT   HPI: 37 y.o.   Married  [place] American  female   Ana Padilla with Patient's last menstrual period was 03/09/2022 (exact date).   here for   medication follow up.   Seen 10/20/21 for pelvic pain and menorrhagia with irregular menses, which is long standing.  She had a hysteroscopy with dilation and curettage on 07/13/21 and pathology showed benign endometrium with progestational effect and endometrial polyp. No hyperplasia or malignancy seen.  She was started on Aygestin 5 mg po bid at the visit on 10/20/21.  Yesterday had random brown red blood and some cramping. Otherwise no bleeding since starting the medication.  No missed pills.   States she is eating more sugar. She is told she is prediabetic.   Considering pregnancy in a year.  They are currently using condoms.   She is followed for anemia through the [facility]. She has alpha thalassemia and sickle cell trait.  Taking Dostinex for prolactinoma. Sees Dr. [name].  GYNECOLOGIC HISTORY: Patient's last menstrual period was 03/09/2022 (exact date). Pt noticed spotting yesterday but no period. Contraception:  condoms.   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   Dysfunction of left eustachian tube 05/25/2018   Cervical polyp 04/28/2017   Morbid obesity ([place]) 04/28/2017   Hyperprolactinemia ([place]) 02/11/2017   Pituitary adenoma ([place]) 02/11/2017   Sickle cell trait ([place]) 02/01/2017   Iron deficiency anemia due to chronic blood loss 11/04/2016    Past Medical History:  Diagnosis  Date   Adenoma of pituitary ([place]) 08/2014   Anemia    Anxiety    Depression    Elevated ferritin level    G6PD deficiency 01/04/2021   Heel spur, left    IBS (irritable bowel syndrome)    Low iron    Morbid obesity ([place])    Seasonal allergies    Sickle cell trait ([place])    Vitamin D deficiency     Past Surgical History:  Procedure Laterality Date   ACHILLES TENDON LENGTHENING Left    BLEPHAROPLASTY Right    lower lid x 3   CHOLECYSTECTOMY  2016   DILATATION & CURETTAGE/HYSTEROSCOPY WITH MYOSURE N/A 07/13/2021   Procedure: DILATATION & CURETTAGE/HYSTEROSCOPY;  Surgeon: Nunzio Cobbs, MD;  Location: [place];  Service: Gynecology;  Laterality: N/A;   ELECTROLYSIS OF MISDIRECTED LASHES Right 08/04/2017   Procedure: ELECTROLYSIS OF MISDIRECTED LASHES RIGHT EYE;  Surgeon: Clista Bernhardt, MD;  Location: [place];  Service: Ophthalmology;  Laterality: Right;   ENTROPIAN REPAIR Right 08/04/2017   Procedure: REPAIR OF ENTROPION WITH TARSAL STRIP RIGHT EYE;  Surgeon: Clista Bernhardt, MD;  Location: [place] [facility];  Service: Ophthalmology;  Laterality: Right;   HEEL SPUR RESECTION Left    HYSTEROSCOPY  WITH D & C N/A 06/27/2017   Procedure: DILATATION & CURETTAGE/HYSTEROSCOPY;  Surgeon: Nunzio Cobbs, MD;  Location: [facility];  Service: Gynecology;  Laterality: N/A;   iud placement     been removed   TENDON REPAIR Left 05/21/2020   Procedure: TENOLYSIS OF LEFT FOOT;  Surgeon: Evelina Bucy, DPM;  Location: [facility];  Service: Podiatry;  Laterality: Left;    Current Outpatient Medications  Medication Sig Dispense Refill   acetaminophen (TYLENOL) 500 MG tablet Take 1 tablet (500 mg total) by mouth every 6 (six) hours as needed for mild pain or moderate pain. 30 tablet 0   cabergoline (DOSTINEX) 0.5 MG tablet TAKE HALF  TABLET  BY MOUTH ONCE A WEEK 10 tablet 3   cetirizine (ZYRTEC) 10 MG tablet Take 10 mg by mouth daily.     Cholecalciferol (VITAMIN D3) 50 MCG (2000 UT) capsule Take  6,000 Units by mouth daily.     colestipol (COLESTID) 1 g tablet Take 1 tablet (1 g total) by mouth 2 (two) times daily. (Patient taking differently: Take 1 g by mouth daily.) 180 tablet 4   Cyanocobalamin (B-12 PO) Take 1,000 mcg by mouth daily.     Ferrous Sulfate (IRON PO) Take 125 mg by mouth daily.     KLS ALLER-FLO 50 MCG/ACT nasal spray Place 2 sprays into both nostrils daily.     OVER THE COUNTER MEDICATION SSS Tonic Takes 3 tbsp daily     Prenatal Vit-Fe Fumarate-FA (MULTIVITAMIN-PRENATAL) 27-0.8 MG TABS tablet Take 1 tablet by mouth daily at 12 noon.     Tetrahydrozoline-Zn Sulfate (ALLERGY RELIEF EYE DROPS OP) Place 1 drop into both eyes daily.     VYVANSE 20 MG capsule Take 20 mg by mouth daily.     norethindrone (AYGESTIN) 5 MG tablet Take 1 tablet (5 mg total) by mouth 2 (two) times daily. 180 tablet 1   No current facility-administered medications for this visit.     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  Tobacco Use   Smoking status: Never   Smokeless tobacco: Never  Vaping Use   Vaping Use: Never used  Substance and Sexual Activity   Alcohol use: Not Currently   Drug use: No   Sexual activity: Yes    Partners: Male    Birth control/protection: Pill, Condom  Other Topics Concern   Not on file  Social History Narrative   Not on file   Social Determinants of Health   Financial Resource Strain: Not on file  Food Insecurity: Not on file   Transportation Needs: Not on file  Physical Activity: Unknown (11/10/2020)   Exercise Vital Sign    Days of Exercise per Week: 0 days    Minutes of Exercise per Session: Not on file  Stress: Stress Concern Present (11/10/2020)  [place] Questionnaire    Feeling of Stress : Rather much  Social Connections: Unknown (11/10/2020)   Social Connection and Isolation Panel [NHANES]    Frequency of Communication with Friends and Family: Not on file    Frequency of Social Gatherings with Friends and Family: Not on file    Attends Religious Services: Not on file    Active Member of Clubs or Organizations: No    Attends Archivist Meetings: Not on file    Marital Status: Not on file  Intimate Partner Violence: Not on file    Review of Systems  All other systems reviewed and are negative.   PHYSICAL EXAMINATION:    BP 132/84 (BP Location: Left Arm, Patient Position: Sitting, Cuff Size: Large)   Pulse 92   Ht '5\' 7"'$  (1.702 m)   Wt (!) 372 lb (168.7 kg)   LMP 03/09/2022 (Exact Date)   SpO2 98%   BMI 58.26 kg/m     General appearance: alert, cooperative and appears stated age   Pelvic: External genitalia:  no lesions              Urethra:  normal appearing urethra with no masses, tenderness or lesions              Bartholins and Skenes: normal                 Vagina: normal appearing vagina with normal color and discharge, no lesions              Cervix: no lesions                Bimanual Exam:  Uterus:  normal size, contour, position, consistency, mobility, non-tender              Adnexa: no mass, fullness, tenderness         Chaperone was present for exam:  Raquel Sarna  ASSESSMENT  Menorrhagia with irregular menses.  On Aygestin 5 mg po bid.  Elevated blood sugar.  BMI 58.26. Desire for future pregnancy. Hx prolactinoma.  On Dostinex.  PLAN  Refill of Aygestin 5 mg po bid.  #180, RF one.  She will do a UPT if she  continues to have irregular bleeding.  She will contact her PCP to have her blood sugar recheck and discuss medical therapy for elevated blood sugar and potential weight loss.  Weight loss recommended.  Cardiovascular benefits and reproductive health benefits reviewed.  FU for annual exam in June, 2024.    An After Visit Summary was printed and given to the patient.  32 min  total time was spent for this patient encounter, including preparation, face-to-face counseling with the patient, coordination of care, and documentation of the encounter.

## 2022-03-10 ENCOUNTER — Encounter: Payer: Self-pay | Admitting: Obstetrics and Gynecology

## 2022-03-10 ENCOUNTER — Ambulatory Visit: Payer: 59 | Admitting: Obstetrics and Gynecology

## 2022-03-10 VITALS — BP 132/84 | HR 92 | Ht 67.0 in | Wt 372.0 lb

## 2022-03-10 DIAGNOSIS — Z5181 Encounter for therapeutic drug level monitoring: Secondary | ICD-10-CM

## 2022-03-10 DIAGNOSIS — N921 Excessive and frequent menstruation with irregular cycle: Secondary | ICD-10-CM

## 2022-03-10 DIAGNOSIS — F909 Attention-deficit hyperactivity disorder, unspecified type: Secondary | ICD-10-CM | POA: Insufficient documentation

## 2022-03-10 MED ORDER — NORETHINDRONE ACETATE 5 MG PO TABS: 5.0000 mg | ORAL_TABLET | Freq: Two times a day (BID) | ORAL | 1 refills | Status: DC

## 2022-03-11 ENCOUNTER — Encounter: Payer: Self-pay | Admitting: Family Medicine

## 2022-03-11 ENCOUNTER — Ambulatory Visit: Payer: 59 | Admitting: Family Medicine

## 2022-03-11 VITALS — BP 126/90 | HR 94 | Temp 98.8°F | Ht 67.0 in | Wt 372.4 lb

## 2022-03-11 DIAGNOSIS — R5383 Other fatigue: Secondary | ICD-10-CM | POA: Diagnosis not present

## 2022-03-11 DIAGNOSIS — D508 Other iron deficiency anemias: Secondary | ICD-10-CM | POA: Diagnosis not present

## 2022-03-11 DIAGNOSIS — D563 Thalassemia minor: Secondary | ICD-10-CM

## 2022-03-11 DIAGNOSIS — D352 Benign neoplasm of pituitary gland: Secondary | ICD-10-CM

## 2022-03-11 DIAGNOSIS — N912 Amenorrhea, unspecified: Secondary | ICD-10-CM

## 2022-03-11 LAB — CBC WITH DIFFERENTIAL/PLATELET
Basophils Absolute: 0 10*3/uL (ref 0.0–0.1)
Basophils Relative: 0.3 % (ref 0.0–3.0)
Eosinophils Absolute: 0.2 10*3/uL (ref 0.0–0.7)
Eosinophils Relative: 2.1 % (ref 0.0–5.0)
HCT: 36.5 % (ref 36.0–46.0)
Hemoglobin: 11.8 g/dL — ABNORMAL LOW (ref 12.0–15.0)
Lymphocytes Relative: 20.1 % (ref 12.0–46.0)
Lymphs Abs: 1.7 10*3/uL (ref 0.7–4.0)
MCHC: 32.3 g/dL (ref 30.0–36.0)
MCV: 67.7 fl — ABNORMAL LOW (ref 78.0–100.0)
Monocytes Absolute: 0.4 10*3/uL (ref 0.1–1.0)
Monocytes Relative: 5.1 % (ref 3.0–12.0)
Neutro Abs: 6 10*3/uL (ref 1.4–7.7)
Neutrophils Relative %: 72.4 % (ref 43.0–77.0)
Platelets: 563 10*3/uL — ABNORMAL HIGH (ref 150.0–400.0)
RBC: 5.4 Mil/uL — ABNORMAL HIGH (ref 3.87–5.11)
RDW: 17.6 % — ABNORMAL HIGH (ref 11.5–15.5)
WBC: 8.3 10*3/uL (ref 4.0–10.5)

## 2022-03-11 LAB — VITAMIN D 25 HYDROXY (VIT D DEFICIENCY, FRACTURES): VITD: 23.15 ng/mL — ABNORMAL LOW (ref 30.00–100.00)

## 2022-03-11 LAB — HEMOGLOBIN A1C: Hgb A1c MFr Bld: 5.5 % (ref 4.6–6.5)

## 2022-03-11 NOTE — Progress Notes (Signed)
Established Patient Office Visit   Subjective  Patient ID: Ana Padilla, female    DOB: 07-28-1985  Age: 37 y.o. MRN: [id number]  Chief Complaint  Patient presents with   Labs Only    Pt is a 37 yo female with  has a past medical history of Adenoma of pituitary, Anemia, ADHD, anxiety, Depression, G6PD deficiency, Heel spur left, IBS, Morbid obesity, Seasonal allergies, Sickle cell trait,and Vitamin D deficiency.  Patient states anxiety is improving.  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  Pt endorses craving sugar.  Denies increased thirst or urination.  Pt seen by OB/Gyn yesterday.  Pt still wanting to conceive but wants to lose wt first.  Seen by Endo for f/u on h/o pituitary adenoma.  Patient endorses continued chronic fatigue.  Patient with a history of anemia.  Previously on tonic SS as OTC iron was not helpful.  Has received iron infusions in the past.       ROS Negative unless stated above    Objective:     BP (!) 126/90 (BP Location: Left Arm, Patient Position: Sitting, Cuff Size: Large)   Pulse 94   Temp 98.8 F (37.1 C) (Oral)   Ht '5\' 7"'$  (1.702 m)   Wt (!) 372 lb 6.4 oz (168.9 kg)   LMP 03/09/2022 (Exact Date)   SpO2 98%   BMI 58.33 kg/m    Physical Exam Constitutional:      General: She is not in acute distress.    Appearance: Normal appearance.  HENT:     Head: Normocephalic and atraumatic.     Nose: Nose normal.     Mouth/Throat:     Mouth: Mucous membranes are moist.  Eyes:     Extraocular Movements: Extraocular movements intact.     Conjunctiva/sclera: Conjunctivae normal.     Pupils: Pupils are equal, round, and reactive to light.  Cardiovascular:     Rate and Rhythm: Normal rate and regular rhythm.     Heart sounds: Normal heart sounds. No murmur heard.    No gallop.  Pulmonary:     Effort: Pulmonary effort is normal. No respiratory distress.     Breath sounds: Normal breath sounds. No wheezing, rhonchi  or rales.  Skin:    General: Skin is warm and dry.  Neurological:     Mental Status: She is alert and oriented to person, place, and time.      No results found for any visits on 03/11/22.    Assessment & Plan:  Fatigue, unspecified type -Chronic -     Hemoglobin A1c -     Fructosamine  Other iron deficiency anemia -Chronic -     Iron, TIBC and Ferritin Panel -     CBC with Differential/Platelet  Morbid obesity (HCC) -Body mass index is 58.33 kg/m. -Diet and exercise encouraged -     Hemoglobin A1c -     VITAMIN D 25 Hydroxy (Vit-D Deficiency, Fractures) -     Fructosamine  Alpha thalassemia trait -     Fructosamine  Pituitary adenoma (HCC) -Stable -Continue cabergoline -Continue follow-up with Endo  Amenorrhea -Continue Aygestin -Continue follow-up with OB/GYN  Will obtain labs to evaluate recent cravings for sugar and patient's chronic fatigue.  Will obtain hemoglobin A1c and fructosamine level given patient's history of thalassemia trait.  Continue to work on lifestyle modifications.  Discussed medication options for weight loss.  Patient to consider.  Continue follow-up with OB/GYN  and Endocrinology  Return if symptoms worsen or fail to improve, for physical.   [name], MD

## 2022-03-15 ENCOUNTER — Encounter: Payer: Self-pay | Admitting: Family Medicine

## 2022-03-15 ENCOUNTER — Telehealth: Payer: Self-pay | Admitting: Family Medicine

## 2022-03-15 LAB — IRON,TIBC AND FERRITIN PANEL
%SAT: 12 % (calc) — ABNORMAL LOW (ref 16–45)
Ferritin: 244 ng/mL — ABNORMAL HIGH (ref 16–154)
Iron: 40 ug/dL (ref 40–190)
TIBC: 337 mcg/dL (calc) (ref 250–450)

## 2022-03-15 LAB — FRUCTOSAMINE: Fructosamine: 241 umol/L (ref 205–285)

## 2022-03-15 NOTE — Telephone Encounter (Signed)
Per 3/11 IB reached out to schedule patient. Patient aware of date and time of appointment.

## 2022-03-16 ENCOUNTER — Other Ambulatory Visit: Payer: Self-pay

## 2022-03-16 ENCOUNTER — Encounter: Payer: Self-pay | Admitting: Hematology

## 2022-03-16 ENCOUNTER — Inpatient Hospital Stay: Payer: 59 | Admitting: Hematology

## 2022-03-16 ENCOUNTER — Inpatient Hospital Stay: Payer: 59 | Attending: Hematology

## 2022-03-16 VITALS — BP 142/93 | HR 100 | Temp 98.9°F | Resp 18 | Ht 67.0 in | Wt 371.1 lb

## 2022-03-16 DIAGNOSIS — D500 Iron deficiency anemia secondary to blood loss (chronic): Secondary | ICD-10-CM | POA: Insufficient documentation

## 2022-03-16 DIAGNOSIS — D573 Sickle-cell trait: Secondary | ICD-10-CM | POA: Diagnosis not present

## 2022-03-16 DIAGNOSIS — Z86018 Personal history of other benign neoplasm: Secondary | ICD-10-CM | POA: Diagnosis not present

## 2022-03-16 DIAGNOSIS — Z9049 Acquired absence of other specified parts of digestive tract: Secondary | ICD-10-CM | POA: Insufficient documentation

## 2022-03-16 DIAGNOSIS — R5382 Chronic fatigue, unspecified: Secondary | ICD-10-CM | POA: Insufficient documentation

## 2022-03-16 DIAGNOSIS — D75839 Thrombocytosis, unspecified: Secondary | ICD-10-CM | POA: Insufficient documentation

## 2022-03-16 DIAGNOSIS — Z793 Long term (current) use of hormonal contraceptives: Secondary | ICD-10-CM | POA: Diagnosis not present

## 2022-03-16 DIAGNOSIS — Z148 Genetic carrier of other disease: Secondary | ICD-10-CM | POA: Insufficient documentation

## 2022-03-16 DIAGNOSIS — E538 Deficiency of other specified B group vitamins: Secondary | ICD-10-CM

## 2022-03-16 DIAGNOSIS — N921 Excessive and frequent menstruation with irregular cycle: Secondary | ICD-10-CM | POA: Insufficient documentation

## 2022-03-16 DIAGNOSIS — D649 Anemia, unspecified: Secondary | ICD-10-CM

## 2022-03-16 DIAGNOSIS — D352 Benign neoplasm of pituitary gland: Secondary | ICD-10-CM | POA: Insufficient documentation

## 2022-03-16 LAB — IRON AND IRON BINDING CAPACITY (CC-WL,HP ONLY)
Iron: 44 ug/dL (ref 28–170)
Saturation Ratios: 13 % (ref 10.4–31.8)
TIBC: 353 ug/dL (ref 250–450)
UIBC: 309 ug/dL (ref 148–442)

## 2022-03-16 LAB — CBC WITH DIFFERENTIAL (CANCER CENTER ONLY)
Abs Immature Granulocytes: 0.04 10*3/uL (ref 0.00–0.07)
Basophils Absolute: 0 10*3/uL (ref 0.0–0.1)
Basophils Relative: 0 %
Eosinophils Absolute: 0.3 10*3/uL (ref 0.0–0.5)
Eosinophils Relative: 3 %
HCT: 38.8 % (ref 36.0–46.0)
Hemoglobin: 12.1 g/dL (ref 12.0–15.0)
Immature Granulocytes: 0 %
Lymphocytes Relative: 21 %
Lymphs Abs: 2.1 10*3/uL (ref 0.7–4.0)
MCH: 21.7 pg — ABNORMAL LOW (ref 26.0–34.0)
MCHC: 31.2 g/dL (ref 30.0–36.0)
MCV: 69.5 fL — ABNORMAL LOW (ref 80.0–100.0)
Monocytes Absolute: 0.6 10*3/uL (ref 0.1–1.0)
Monocytes Relative: 6 %
Neutro Abs: 7.2 10*3/uL (ref 1.7–7.7)
Neutrophils Relative %: 70 %
Platelet Count: 598 10*3/uL — ABNORMAL HIGH (ref 150–400)
RBC: 5.58 MIL/uL — ABNORMAL HIGH (ref 3.87–5.11)
RDW: 17.9 % — ABNORMAL HIGH (ref 11.5–15.5)
WBC Count: 10.2 10*3/uL (ref 4.0–10.5)
nRBC: 0 % (ref 0.0–0.2)

## 2022-03-16 LAB — RETIC PANEL
Immature Retic Fract: 21.9 % — ABNORMAL HIGH (ref 2.3–15.9)
RBC.: 5.61 MIL/uL — ABNORMAL HIGH (ref 3.87–5.11)
Retic Count, Absolute: 92 10*3/uL (ref 19.0–186.0)
Retic Ct Pct: 1.6 % (ref 0.4–3.1)
Reticulocyte Hemoglobin: 26.1 pg — ABNORMAL LOW (ref >27.9)

## 2022-03-16 LAB — FERRITIN: Ferritin: 240 ng/mL (ref 11–307)

## 2022-03-16 NOTE — Progress Notes (Signed)
Ana Padilla   [phone] [phone]   Clinic Follow up Note   Patient Care Team: [name], MD as PCP - General (Family Medicine) Nunzio Cobbs, MD as Consulting Physician (Obstetrics and Gynecology)  Date of Service:  03/16/2022  CHIEF COMPLAINT: f/u of anemia  CURRENT THERAPY:  Oral iron  IV Feraheme as needed, last 03/30/21  ASSESSMENT & PLAN:  Ana Padilla is a 37 y.o. female with   1. Iron deficiency anemia, secondary to menorrhagia -she has tried OCPs in the past which did not control IDA well. Did not tolerate most oral iron supplements -s/p IV Feraheme q1-3 months 2018 - 2019, then started SSS tonic which resolved her anemia through 2023. -She recently developed acute on chronic anemia, Hgb 9.9 on 02/23/21, with serum iron 43, TIBC 364, 12% saturation, and ferritin of 29. She received two more doses of IV Feraheme in 03/2021. -she began to have abnormal periods and excessive bleeding, underwent hysteroscopy on 07/13/21. Path was benign. -she has been on Aygestin since 10/2021 and has had no menstrual period since then. -She did receive additional IV iron in late October and the beginning of November 2023 -Lab reviewed, her hemoglobin is 12.1 today, her serum iron and TIBC were normal today, ferritin still pending, but it was slightly elevated last week. -No need IV iron for now, and probably not needed in the future as long as she has no menstrual period.   2. 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  -previous hemoglobinopathy panel in 2018 showed elevated Hgb A2 4.4, elevated Hgb S 31, and low Hgb A 64.6 Which is consistent with sickle cell trait (heterozygous). -I discussed her recent genetic testing which came back positive for alpha thalassemia trait, she is a carrier, which will probably not impact her  hemoglobin level much.   3. G6PD gene variant, VG8M T  -She and her spouse did "23 and Me" testing to determine his sickle cell status, she was found to have G6PD gene variant T which is associated with high risk for hemolysis with oxidative stress   5. Anxiety, depression, pituitary adenoma, obesity, hyperprolactinemia  -per PCP, endo   6.  Thrombocytosis -She has had intermittent thrombocytosis with platelets in 400-600 range, likely related to iron deficiency or chronic inflammation -She still has mild thrombocytosis even with resolved iron deficiency lately -She also has significant chronic fatigue, some bodyaches, I recommend workup to rule out autoimmune disease, she will talk to her primary care physician.   PLAN: -Lab reviewed, no need IV iron for now -Follow-up with lab every 3 months -Office visit in 6 months   INTERVAL HISTORY:  Ana Padilla is here for a follow up of anemia. She was last seen by NP Lacie on 10/2021.  She received 2 doses of IV Feraheme after last visit, her fatigue is slightly improved, but no significant change over the course.  Her main concern is still chronic fatigue, although she is able to proceed through and function at work and at home.  She has mild body aches, she has not had any menstrual period since she started Aygestin in October last year, no other bleedings.   All other systems were reviewed with the patient and are negative.  MEDICAL HISTORY:  Past Medical History:  Diagnosis Date   Adenoma of pituitary ([place]) 08/2014  Anemia    Anxiety    Depression    Elevated ferritin level    G6PD deficiency 01/04/2021   Heel spur, left    IBS (irritable bowel syndrome)    Low iron    Morbid obesity ([facility])    Seasonal allergies    Sickle cell trait ([place])    Vitamin D deficiency     SURGICAL HISTORY: Past Surgical History:  Procedure Laterality Date   ACHILLES TENDON LENGTHENING Left    BLEPHAROPLASTY Right    lower lid x 3    CHOLECYSTECTOMY  2016   DILATATION & CURETTAGE/HYSTEROSCOPY WITH MYOSURE N/A 07/13/2021   Procedure: DILATATION & CURETTAGE/HYSTEROSCOPY;  Surgeon: Nunzio Cobbs, MD;  Location: [place];  Service: Gynecology;  Laterality: N/A;   ELECTROLYSIS OF MISDIRECTED LASHES Right 08/04/2017   Procedure: ELECTROLYSIS OF MISDIRECTED LASHES RIGHT EYE;  Surgeon: Clista Bernhardt, MD;  Location: [place] [facility];  Service: Ophthalmology;  Laterality: Right;   ENTROPIAN REPAIR Right 08/04/2017   Procedure: REPAIR OF ENTROPION WITH TARSAL STRIP RIGHT EYE;  Surgeon: Clista Bernhardt, MD;  Location: [place];  Service: Ophthalmology;  Laterality: Right;   HEEL SPUR RESECTION Left    HYSTEROSCOPY WITH D & C N/A 06/27/2017   Procedure: DILATATION & CURETTAGE/HYSTEROSCOPY;  Surgeon: Nunzio Cobbs, MD;  Location: [facility];  Service: Gynecology;  Laterality: N/A;   iud placement     been removed   TENDON REPAIR Left 05/21/2020   Procedure: TENOLYSIS OF LEFT FOOT;  Surgeon: Evelina Bucy, DPM;  Location: [facility];  Service: Podiatry;  Laterality: Left;    I have reviewed the social history and family history with the patient and they are unchanged from previous note.  ALLERGIES:  is allergic to food, pollinex-t [modified tree tyrosine adsorbate], and quinine.  MEDICATIONS:  Current Outpatient Medications  Medication Sig Dispense Refill   acetaminophen (TYLENOL) 500 MG tablet Take 1 tablet (500 mg total) by mouth every 6 (six) hours as needed for mild pain or moderate pain. 30 tablet 0   cabergoline (DOSTINEX) 0.5 MG tablet TAKE HALF  TABLET  BY MOUTH ONCE A WEEK 10 tablet 3   cetirizine (ZYRTEC) 10 MG tablet Take 10 mg by mouth daily.     Cholecalciferol (VITAMIN D3) 50 MCG (2000 UT) capsule Take 6,000 Units by mouth daily.     colestipol (COLESTID) 1 g tablet Take 1 tablet (1 g total) by mouth 2 (two) times daily. (Patient taking differently: Take 1 g by mouth daily.) 180 tablet 4   Cyanocobalamin (B-12 PO)  Take 1,000 mcg by mouth daily.     Ferrous Sulfate (IRON PO) Take 125 mg by mouth daily.     KLS ALLER-FLO 50 MCG/ACT nasal spray Place 2 sprays into both nostrils daily.     norethindrone (AYGESTIN) 5 MG tablet Take 1 tablet (5 mg total) by mouth 2 (two) times daily. 180 tablet 1   OVER THE COUNTER MEDICATION SSS Tonic Takes 3 tbsp daily     Prenatal Vit-Fe Fumarate-FA (MULTIVITAMIN-PRENATAL) 27-0.8 MG TABS tablet Take 1 tablet by mouth daily at 12 noon.     Tetrahydrozoline-Zn Sulfate (ALLERGY RELIEF EYE DROPS OP) Place 1 drop into both eyes daily.     VYVANSE 20 MG capsule Take 20 mg by mouth daily.     No current facility-administered medications for this visit.    PHYSICAL EXAMINATION:  Vitals:   03/16/22 0919  BP: (!) 142/93  Pulse: 100  Resp: 18  Temp: 98.9 F (37.2 C)  SpO2: 99%   Wt Readings from Last 3 Encounters:  03/16/22 (!) 371 lb 1.6 oz (168.3 kg)  03/11/22 (!) 372 lb 6.4 oz (168.9 kg)  03/10/22 (!) 372 lb (168.7 kg)     GENERAL:alert, no distress and comfortable SKIN: skin color normal, no rashes or significant lesions EYES: normal, Conjunctiva are pink and non-injected, sclera clear  NEURO: alert & oriented x 3 with fluent speech  LABORATORY DATA:  I have reviewed the data as listed    Latest Ref Rng & Units 03/16/2022    9:01 AM 03/11/2022   10:45 AM 12/03/2021    9:25 AM  CBC  WBC 4.0 - 10.5 K/uL 10.2  8.3  8.3   Hemoglobin 12.0 - 15.0 g/dL 12.1  11.8  11.3   Hematocrit 36.0 - 46.0 % 38.8  36.5  36.4   Platelets 150 - 400 K/uL 598  563.0  432         Latest Ref Rng & Units 10/13/2021    9:37 AM 09/11/2021    9:51 AM 03/19/2021    1:28 PM  CMP  Glucose 70 - 99 mg/dL 110  89  98   BUN 6 - 20 mg/dL '8  10  8   '$ Creatinine 0.44 - 1.00 mg/dL 0.78  0.76  0.72   Sodium 135 - 145 mmol/L 139  139  140   Potassium 3.5 - 5.1 mmol/L 3.7  3.8  3.9   Chloride 98 - 111 mmol/L 106  103  104   CO2 22 - 32 mmol/L '23  27  29   '$ Calcium 8.9 - 10.3 mg/dL 9.2  10.2   9.4   Total Protein 6.5 - 8.1 g/dL   7.8   Total Bilirubin 0.3 - 1.2 mg/dL   0.4   Alkaline Phos 38 - 126 U/L   72   AST 15 - 41 U/L   26   ALT 0 - 44 U/L   19       RADIOGRAPHIC STUDIES: I have personally reviewed the radiological images as listed and agreed with the findings in the report. No results found.    No orders of the defined types were placed in this encounter.  All questions were answered. The patient knows to call the clinic with any problems, questions or concerns. No barriers to learning was detected. The total time spent in the appointment was 25 minutes.     Truitt Merle, MD 03/16/2022

## 2022-03-18 ENCOUNTER — Ambulatory Visit: Payer: 59 | Admitting: Family Medicine

## 2022-03-18 VITALS — BP 130/88 | HR 100 | Temp 99.2°F | Ht 67.0 in | Wt 371.2 lb

## 2022-03-18 DIAGNOSIS — D75839 Thrombocytosis, unspecified: Secondary | ICD-10-CM

## 2022-03-18 DIAGNOSIS — D500 Iron deficiency anemia secondary to blood loss (chronic): Secondary | ICD-10-CM | POA: Diagnosis not present

## 2022-03-18 DIAGNOSIS — Z0282 Encounter for adoption services: Secondary | ICD-10-CM

## 2022-03-18 DIAGNOSIS — R5383 Other fatigue: Secondary | ICD-10-CM | POA: Diagnosis not present

## 2022-03-18 DIAGNOSIS — E559 Vitamin D deficiency, unspecified: Secondary | ICD-10-CM

## 2022-03-18 LAB — TSH: TSH: 1.34 u[IU]/mL (ref 0.35–5.50)

## 2022-03-18 LAB — SEDIMENTATION RATE: Sed Rate: 85 mm/hr — ABNORMAL HIGH (ref 0–20)

## 2022-03-18 LAB — T4, FREE: Free T4: 0.98 ng/dL (ref 0.60–1.60)

## 2022-03-18 MED ORDER — VITAMIN D (ERGOCALCIFEROL) 1.25 MG (50000 UNIT) PO CAPS: 50000.0000 [IU] | ORAL_CAPSULE | ORAL | 0 refills | Status: DC

## 2022-03-18 NOTE — Progress Notes (Signed)
Established Patient Office Visit   Subjective  Patient ID: Ana Padilla, female    DOB: [birth year]  Age: 37 y.o. MRN: [id number]  Chief Complaint  Patient presents with   Medical Management of Chronic Issues    Pt is a 37 yo female with pmh sig for pituitary adenoma, alpha thalessemia trait, G6PD def, obesity, chronic anemia due to blood loss, who was seen for f/u.  Recent labs reviewed.  Pt had f/u with Heme/Onc.  Thrombocytosis felt due to chronic inflammation and possible autoimmune disorder.  Patient advised to follow-up with PCP for labs/further eval.  Patient expresses concern that platelets continue to be elevated.  Typically in the mid to low 400s but this is the first time they have been in the 500s.  Patient is adopted to know if there will be any possible concerns before becoming pregnant.  Endorses chronic fatigue but attributed it to history of chronic anemia.  Has some joint pain but always thought it was due to weight/age.      ROS Negative unless stated above    Objective:     BP 130/88 (BP Location: Left Arm, Patient Position: Sitting, Cuff Size: Large)   Pulse 100   Temp 99.2 F (37.3 C) (Oral)   Ht '5\' 7"'$  (1.702 m)   Wt (!) 371 lb 3.2 oz (168.4 kg)   LMP 03/09/2022 (Exact Date)   SpO2 98%   BMI 58.14 kg/m    Physical Exam Constitutional:      General: She is not in acute distress.    Appearance: Normal appearance.  HENT:     Head: Normocephalic and atraumatic.     Nose: Nose normal.     Mouth/Throat:     Mouth: Mucous membranes are moist.  Eyes:     Extraocular Movements: Extraocular movements intact.     Conjunctiva/sclera: Conjunctivae normal.     Pupils: Pupils are equal, round, and reactive to light.  Cardiovascular:     Rate and Rhythm: Normal rate.     Heart sounds: No murmur heard.    No gallop.  Pulmonary:     Effort: Pulmonary effort is normal. No respiratory distress.     Breath sounds: Normal breath sounds. No wheezing, rhonchi  or rales.  Skin:    General: Skin is warm and dry.  Neurological:     Mental Status: She is alert and oriented to person, place, and time.      No results found for any visits on 03/18/22.    Assessment & Plan:  Thrombocytosis -Platelets 598 on 03/16/2022 and 563 on 03/11/2022 -     Sedimentation rate -     Rheumatoid factor -     Lupus (SLE) Analysis -     TSH -     T4, free  Other fatigue -concern for possible autoimmune d/o vs chronic fatigue due to chronic anemia.   -anemia improving, hgb 12/1 on 03/16/22. -     Sedimentation rate -     Rheumatoid factor -     Lupus (SLE) Analysis -     TSH -     T4, free  Iron deficiency anemia due to chronic blood loss -Stable, likely 2/2 pt being on Aygestin and not having menses since 10/2021. -Hemoglobin 12.1 on 03/16/22 -Microcytosis also noted likely 2/2 history of sickle cell trait, and alpha thalassemia carrier.  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  Vitamin D deficiency -Vitamin D 23.15 point 03/11/2022 -Plan: Ergocalciferol 50,000 IU weekly x 12 weeks.   Return if symptoms worsen or fail to improve.   [name], MD

## 2022-03-19 LAB — RHEUMATOID FACTOR: Rheumatoid fact SerPl-aCnc: <14 IU/mL (ref <14)

## 2022-03-20 LAB — LUPUS (SLE) ANALYSIS
Anti Nuclear Antibody (ANA): NEGATIVE
Anti-striation Abs: NEGATIVE
Complement C4, Serum: 52 mg/dL — ABNORMAL HIGH (ref 12–38)
ENA RNP Ab: 0.3 AI (ref 0.0–0.9)
ENA SM Ab Ser-aCnc: <0.2 AI (ref 0.0–0.9)
ENA SSA (RO) Ab: <0.2 AI (ref 0.0–0.9)
ENA SSB (LA) Ab: <0.2 AI (ref 0.0–0.9)
Mitochondrial Ab: <20 Units (ref 0.0–20.0)
Parietal Cell Ab: 1.6 Units (ref 0.0–20.0)
Scleroderma (Scl-70) (ENA) Antibody, IgG: <0.2 AI (ref 0.0–0.9)
Smooth Muscle Ab: 4 Units (ref 0–19)
Thyroperoxidase Ab SerPl-aCnc: 16 IU/mL (ref 0–34)
dsDNA Ab: <1 IU/mL (ref 0–9)

## 2022-03-23 ENCOUNTER — Other Ambulatory Visit: Payer: Self-pay

## 2022-03-23 DIAGNOSIS — D75839 Thrombocytosis, unspecified: Secondary | ICD-10-CM

## 2022-05-31 ENCOUNTER — Other Ambulatory Visit: Payer: Self-pay | Admitting: Physician Assistant

## 2022-06-01 ENCOUNTER — Other Ambulatory Visit: Payer: Self-pay

## 2022-06-01 DIAGNOSIS — D352 Benign neoplasm of pituitary gland: Secondary | ICD-10-CM

## 2022-06-01 MED ORDER — CABERGOLINE 0.5 MG PO TABS: ORAL_TABLET | ORAL | 3 refills | Status: AC

## 2022-06-04 ENCOUNTER — Telehealth: Payer: Self-pay | Admitting: Physician Assistant

## 2022-06-04 NOTE — Telephone Encounter (Signed)
Colestipol was refilled for 3 month supply on 06/01/22 and sent to [facility].

## 2022-06-04 NOTE — Telephone Encounter (Signed)
Received MyChart message.  Patient is requesting a follow up office visit and a medication refill.  OV made for 08/27/22 at 9:30 a.m.  She will need a refill of her medicaitons to hold her until that appointment.  Please advise.  Thank you.

## 2022-06-16 ENCOUNTER — Encounter: Payer: 59 | Admitting: Family Medicine

## 2022-06-17 ENCOUNTER — Encounter: Payer: 59 | Admitting: Family Medicine

## 2022-06-17 ENCOUNTER — Other Ambulatory Visit: Payer: 59

## 2022-07-01 ENCOUNTER — Ambulatory Visit: Payer: 59 | Admitting: Family Medicine

## 2022-07-01 ENCOUNTER — Encounter: Payer: Self-pay | Admitting: Family Medicine

## 2022-07-01 VITALS — BP 118/84 | HR 94 | Temp 99.4°F | Wt 371.4 lb

## 2022-07-01 DIAGNOSIS — N920 Excessive and frequent menstruation with regular cycle: Secondary | ICD-10-CM

## 2022-07-01 DIAGNOSIS — E559 Vitamin D deficiency, unspecified: Secondary | ICD-10-CM

## 2022-07-01 DIAGNOSIS — R5383 Other fatigue: Secondary | ICD-10-CM | POA: Diagnosis not present

## 2022-07-01 DIAGNOSIS — D500 Iron deficiency anemia secondary to blood loss (chronic): Secondary | ICD-10-CM | POA: Diagnosis not present

## 2022-07-01 DIAGNOSIS — D352 Benign neoplasm of pituitary gland: Secondary | ICD-10-CM

## 2022-07-01 LAB — CBC WITH DIFFERENTIAL/PLATELET
Basophils Absolute: 0 10*3/uL (ref 0.0–0.1)
Basophils Relative: 0.2 % (ref 0.0–3.0)
Eosinophils Absolute: 0.2 10*3/uL (ref 0.0–0.7)
Eosinophils Relative: 2.4 % (ref 0.0–5.0)
HCT: 37.6 % (ref 36.0–46.0)
Hemoglobin: 11.9 g/dL — ABNORMAL LOW (ref 12.0–15.0)
Lymphocytes Relative: 20.3 % (ref 12.0–46.0)
Lymphs Abs: 1.9 10*3/uL (ref 0.7–4.0)
MCHC: 31.8 g/dL (ref 30.0–36.0)
MCV: 68.2 fl — ABNORMAL LOW (ref 78.0–100.0)
Monocytes Absolute: 0.4 10*3/uL (ref 0.1–1.0)
Monocytes Relative: 4.4 % (ref 3.0–12.0)
Neutro Abs: 6.8 10*3/uL (ref 1.4–7.7)
Neutrophils Relative %: 72.7 % (ref 43.0–77.0)
Platelets: 509 10*3/uL — ABNORMAL HIGH (ref 150.0–400.0)
RBC: 5.52 Mil/uL — ABNORMAL HIGH (ref 3.87–5.11)
RDW: 18 % — ABNORMAL HIGH (ref 11.5–15.5)
WBC: 9.3 10*3/uL (ref 4.0–10.5)

## 2022-07-01 LAB — T4, FREE: Free T4: 1.04 ng/dL (ref 0.60–1.60)

## 2022-07-01 LAB — TSH: TSH: 1.74 u[IU]/mL (ref 0.35–5.50)

## 2022-07-01 LAB — VITAMIN D 25 HYDROXY (VIT D DEFICIENCY, FRACTURES): VITD: 50.37 ng/mL (ref 30.00–100.00)

## 2022-07-01 NOTE — Progress Notes (Signed)
Established Patient Office Visit   Subjective  Patient ID: Ana Padilla, female    DOB: 1985/06/12  Age: 37 y.o. MRN: [id number]  Chief Complaint  Patient presents with   Fatigue    Has had some spotting, will go see OB/GYN next week. Last 3 weeks has not felt herself, has not been wanting to get up in the mornings (is a morning person). Has restarted her liquid iron.     Patient is a 37 year old female with pmh sig for pituitary adenoma, morbid obesity, iron deficiency anemia, menorrhagia with irregular cycle, vitamin D deficiency, sickle cell trait, thrombocytosis, alpha thalassemia trait, ADHD with limited family medical history due to adoption who was seen for ongoing concern.  Patient endorses continued fatigue times months.  Concern vitamin D or iron may be low again.  Patient taking OTC vitamin D 5000 IUs daily and SS tonic iron supplement.  Patient also notes intermittent spotting x 2 months.  On Colestid.  Followed by OB/GYN.  Patient also mentions trying throughout the timing of when to take medications in the morning as Vyvanse seems to wear off in the afternoon.  Currently taking Vyvanse 50 mg twice daily and taking Colestid with food.  Pt wants to lose weight and become pregnant.    Past Medical History:  Diagnosis Date   Adenoma of pituitary ([facility]) 08/2014   Anemia    Anxiety    Depression    Elevated ferritin level    G6PD deficiency 01/04/2021   Heel spur, left    IBS (irritable bowel syndrome)    Low iron    Morbid obesity ([facility])    Seasonal allergies    Sickle cell trait ([facility])    Vitamin D deficiency    Past Surgical History:  Procedure Laterality Date   ACHILLES TENDON LENGTHENING Left    BLEPHAROPLASTY Right    lower lid x 3   CHOLECYSTECTOMY  2016   DILATATION & CURETTAGE/HYSTEROSCOPY WITH MYOSURE N/A 07/13/2021   Procedure: DILATATION & CURETTAGE/HYSTEROSCOPY;  Surgeon: Patton Salles, MD;  Location: [facility];  Service: Gynecology;  Laterality:  N/A;   ELECTROLYSIS OF MISDIRECTED LASHES Right 08/04/2017   Procedure: ELECTROLYSIS OF MISDIRECTED LASHES RIGHT EYE;  Surgeon: Floydene Flock, MD;  Location: [facility];  Service: Ophthalmology;  Laterality: Right;   ENTROPIAN REPAIR Right 08/04/2017   Procedure: REPAIR OF ENTROPION WITH TARSAL STRIP RIGHT EYE;  Surgeon: Floydene Flock, MD;  Location: [facility];  Service: Ophthalmology;  Laterality: Right;   HEEL SPUR RESECTION Left    HYSTEROSCOPY WITH D & C N/A 06/27/2017   Procedure: DILATATION & CURETTAGE/HYSTEROSCOPY;  Surgeon: Patton Salles, MD;  Location: [facility];  Service: Gynecology;  Laterality: N/A;   iud placement     been removed   TENDON REPAIR Left 05/21/2020   Procedure: TENOLYSIS OF LEFT FOOT;  Surgeon: Park Liter, DPM;  Location: [facility];  Service: Podiatry;  Laterality: Left;   Social History   Tobacco Use   Smoking status: Never   Smokeless tobacco: Never  Vaping Use   Vaping Use: Never used  Substance Use Topics   Alcohol use: Not Currently   Drug use: No   Family History  Adopted: Yes  Problem Relation Age of Onset   Sickle cell trait Mother    Heart disease Father    Obesity Father    Colon cancer Neg Hx    Esophageal cancer Neg Hx    Stomach  cancer Neg Hx    Sleep apnea Neg Hx    Allergies  Allergen Reactions   Food Other (See Comments)    Seasonal Allergies & fresh fruit---itchy mouth&nose    Pollinex-T [Modified Tree Tyrosine Adsorbate]     Itchy nose and eyes, ears, runny nose,    Quinine Other (See Comments)    g6pd deficiency      ROS Negative unless stated above    Objective:     BP 118/84 (BP Location: Left Wrist, Patient Position: Sitting, Cuff Size: Normal)   Pulse 94   Temp 99.4 F (37.4 C) (Oral)   Wt (!) 371 lb 6.4 oz (168.5 kg)   SpO2 97%   BMI 58.17 kg/m  BP Readings from Last 3 Encounters:  07/01/22 118/84  03/18/22 130/88  03/16/22 (!) 142/93   Wt Readings from Last 3 Encounters:  07/01/22 (!) 371 lb  6.4 oz (168.5 kg)  03/18/22 (!) 371 lb 3.2 oz (168.4 kg)  03/16/22 (!) 371 lb 1.6 oz (168.3 kg)      Physical Exam Constitutional:      General: She is not in acute distress.    Appearance: Normal appearance.  HENT:     Head: Normocephalic and atraumatic.     Nose: Nose normal.     Mouth/Throat:     Mouth: Mucous membranes are moist.  Eyes:     Extraocular Movements: Extraocular movements intact.     Conjunctiva/sclera: Conjunctivae normal.  Cardiovascular:     Rate and Rhythm: Normal rate and regular rhythm.     Heart sounds: Normal heart sounds. No murmur heard.    No gallop.  Pulmonary:     Effort: Pulmonary effort is normal. No respiratory distress.     Breath sounds: Normal breath sounds. No wheezing, rhonchi or rales.  Skin:    General: Skin is warm and dry.  Neurological:     Mental Status: She is alert and oriented to person, place, and time. Mental status is at baseline.       07/01/2022    8:22 AM 03/11/2022   11:22 AM 09/11/2021    9:17 AM  Depression screen PHQ 2/9  Decreased Interest 0 1 2  Down, Depressed, Hopeless 0 1 2  PHQ - 2 Score 0 2 4  Altered sleeping 1 1 1   Tired, decreased energy 3 1 3   Change in appetite 2 2 2   Feeling bad or failure about yourself  0 1 0  Trouble concentrating 1 1 1   Moving slowly or fidgety/restless 0 0 0  Suicidal thoughts 0 0 0  PHQ-9 Score 7 8 11   Difficult doing work/chores Very difficult Somewhat difficult Very difficult      07/01/2022    8:23 AM 12/10/2020    5:32 PM 11/13/2020   11:41 AM 03/19/2020   11:30 AM  GAD 7 : Generalized Anxiety Score  Nervous, Anxious, on Edge 1 3 2 3   Control/stop worrying 0 2 1 3   Worry too much - different things 0 2 2 3   Trouble relaxing 1 2 1 1   Restless 0 0 0 1  Easily annoyed or irritable 1 3 1 1   Afraid - awful might happen 0 0 1 0  Total GAD 7 Score 3 12 8 12   Anxiety Difficulty Somewhat difficult   Very difficult      No results found for any visits on 07/01/22.     Assessment & Plan:  Other fatigue -  CBC with Differential/Platelet -     TSH -     T4, free -     Iron, TIBC and Ferritin Panel  Vitamin D deficiency -     VITAMIN D 25 Hydroxy (Vit-D Deficiency, Fractures)  Iron deficiency anemia due to chronic blood loss -In the past required iron infusions -Obtain labs -Continue follow-up with heme-onc -     CBC with Differential/Platelet  Spotting -Continue Colestid  Pituitary adenoma ([facility]) -Stable -Continue follow-up with Endo  Morbid obesity ([facility]) -Body mass index is 58.17 kg/m.   Current fatigue likely multifactorial including anemia, vitamin deficiency, obesity.  Obtain labs to evaluate further.  Patient encouraged to increase physical activity, self-care.  Consider weight loss medication options.  Keep upcoming follow-up with OB/GYN as Colestid may need to be adjusted.  Continue iron and vitamin D supplements.  Return if symptoms worsen or fail to improve.   Deeann Saint, MD

## 2022-07-02 LAB — IRON,TIBC AND FERRITIN PANEL
%SAT: 13 % (calc) — ABNORMAL LOW (ref 16–45)
Ferritin: 177 ng/mL — ABNORMAL HIGH (ref 16–154)
Iron: 42 ug/dL (ref 40–190)
TIBC: 330 mcg/dL (calc) (ref 250–450)

## 2022-07-06 ENCOUNTER — Ambulatory Visit: Payer: 59 | Admitting: Obstetrics and Gynecology

## 2022-07-29 ENCOUNTER — Encounter: Payer: Self-pay | Admitting: Internal Medicine

## 2022-07-29 ENCOUNTER — Ambulatory Visit: Payer: 59 | Admitting: Internal Medicine

## 2022-07-29 VITALS — BP 147/96 | HR 91 | Resp 16 | Ht 67.0 in | Wt 371.0 lb

## 2022-07-29 DIAGNOSIS — R5383 Other fatigue: Secondary | ICD-10-CM | POA: Diagnosis not present

## 2022-07-29 DIAGNOSIS — R899 Unspecified abnormal finding in specimens from other organs, systems and tissues: Secondary | ICD-10-CM | POA: Diagnosis not present

## 2022-07-29 DIAGNOSIS — M238X1 Other internal derangements of right knee: Secondary | ICD-10-CM

## 2022-07-29 DIAGNOSIS — R700 Elevated erythrocyte sedimentation rate: Secondary | ICD-10-CM | POA: Diagnosis not present

## 2022-07-29 LAB — SEDIMENTATION RATE: Sed Rate: 55 mm/h — ABNORMAL HIGH (ref 0–20)

## 2022-07-29 NOTE — Progress Notes (Signed)
Office Visit Note  Patient: Ana Padilla             Date of Birth: 17-Mar-1985           MRN: [id number]             PCP: Deeann Saint, MD Referring: Deeann Saint, MD Visit Date: 07/29/2022   Subjective:  New Patient (Initial Visit) (Patient states she has elevated platelets. Patient states she has knee pain sometimes. Patient states she was told she has some inflammation and she wants to know where that would be coming from. )   History of Present Illness: Ana Padilla is a 37 y.o. female here for evaluation of abnormal labs with elevated sedimentation rate and complement C4 checked evaluating chronic fatigue. She has longstanding chronic fatigue dating back into her 47s. This has not been attributed to any specific underlying cause, thought more multifactorial including related to morbid obesity.  Has had longstanding irregular menstrual periods treated with Aygestin and has had dilatation and curettaged.  Also found to be associated with pituitary adenoma.  Has had mild iron deficiency anemia on labs repeatedly and treated intermittent IV supplementation last was in November.  Monitoring in hematology clinic testing demonstrating a elevated sedimentation rate of 85 and increased complement C4 of 52 and elevated platelets concerning for some active inflammation.  Is not experiencing any new skin rashes joint pain or swelling or other recent illness.  She gets knee pain occasionally and notices some crepitus. Denies oral or nasal ulcers, lymphadenopathy, photosensitive rashes, or raynaud's symptoms. Besides menorrhagia no abnormal bleeding or history of blood clots.   Labs reviewed 03/2022 ANA neg dsDNA, RNP, Sm, SM, Scl-70, SSA, SSB, TPO Abs neg RF neg Complement C4 52 ESR 85  Activities of Daily Living:  Patient reports morning stiffness for 0 minute.   Patient Denies nocturnal pain.  Difficulty dressing/grooming: Denies Difficulty climbing stairs: Denies Difficulty  getting out of chair: Denies Difficulty using hands for taps, buttons, cutlery, and/or writing: Denies  Review of Systems  Constitutional:  Positive for fatigue.  HENT:  Negative for mouth sores and mouth dryness.   Eyes:  Negative for dryness.  Respiratory:  Negative for shortness of breath.   Cardiovascular:  Negative for chest pain and palpitations.  Gastrointestinal:  Negative for blood in stool, constipation and diarrhea.  Endocrine: Negative for increased urination.  Genitourinary:  Negative for involuntary urination.  Musculoskeletal:  Positive for joint pain and joint pain. Negative for gait problem, joint swelling, myalgias, muscle weakness, morning stiffness, muscle tenderness and myalgias.  Skin:  Negative for color change, rash, hair loss and sensitivity to sunlight.  Allergic/Immunologic: Negative for susceptible to infections.  Neurological:  Negative for dizziness and headaches.  Hematological:  Negative for swollen glands.  Psychiatric/Behavioral:  Positive for sleep disturbance. Negative for depressed mood. The patient is nervous/anxious.     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   Pituitary  adenoma (HCC) 02/11/2017   Sickle cell trait (HCC) 02/01/2017   Iron deficiency anemia due to chronic blood loss 11/04/2016    Past Medical History:  Diagnosis Date   Adenoma of pituitary (HCC) 08/2014   Anemia    Anxiety     Depression    Elevated ferritin level    G6PD deficiency 01/04/2021   Heel spur, left    IBS (irritable bowel syndrome)    Low iron    Morbid obesity (HCC)    Seasonal allergies    Sickle cell trait (HCC)    Vitamin D deficiency     Family History  Adopted: Yes  Problem Relation Age of Onset   Sickle cell trait Mother    Heart disease Father    Obesity Father    Colon cancer Neg Hx    Esophageal cancer Neg Hx    Stomach cancer Neg Hx    Sleep apnea Neg Hx    Past Surgical History:  Procedure Laterality Date   ACHILLES TENDON LENGTHENING Left    BLEPHAROPLASTY Right    lower lid x 3   CHOLECYSTECTOMY  2016   DILATATION & CURETTAGE/HYSTEROSCOPY WITH MYOSURE N/A 07/13/2021   Procedure: DILATATION & CURETTAGE/HYSTEROSCOPY;  Surgeon: Patton Salles, MD;  Location: [facility] OR;  Service: Gynecology;  Laterality: N/A;   ELECTROLYSIS OF MISDIRECTED LASHES Right 08/04/2017   Procedure: ELECTROLYSIS OF MISDIRECTED LASHES RIGHT EYE;  Surgeon: Floydene Flock, MD;  Location: [facility] OR;  Service: Ophthalmology;  Laterality: Right;   ENTROPIAN REPAIR Right 08/04/2017   Procedure: REPAIR OF ENTROPION WITH TARSAL STRIP RIGHT EYE;  Surgeon: Floydene Flock, MD;  Location: [facility] OR;  Service: Ophthalmology;  Laterality: Right;   HEEL SPUR RESECTION Left    HYSTEROSCOPY WITH D & C N/A 06/27/2017   Procedure: DILATATION & CURETTAGE/HYSTEROSCOPY;  Surgeon: Patton Salles, MD;  Location: [facility] ORS;  Service: Gynecology;  Laterality: N/A;   iud placement     been removed   TENDON REPAIR Left 05/21/2020   Procedure: TENOLYSIS OF LEFT FOOT;  Surgeon: Park Liter, DPM;  Location: [facility] ORS;  Service: Podiatry;  Laterality: Left;   Social History   Social History Narrative   Not on file   Immunization History  Administered Date(s) Administered   Influenza,inj,Quad PF,6+ Mos 10/17/2020   Influenza-Unspecified 09/23/2016   PFIZER(Purple Top)SARS-COV-2 Vaccination 10/19/2019, 11/10/2019    Tdap 07/01/2017     Objective: Vital Signs: BP (!) 147/96 (BP Location: Right Arm, Patient Position: Sitting, Cuff Size: Normal)   Pulse 91   Resp 16   Ht 5\' 7"  (1.702 m)   Wt (!) 371 lb (168.3 kg)   LMP  (LMP Unknown)   BMI 58.11 kg/m    Physical Exam Constitutional:      Appearance: She is obese.  Eyes:     Conjunctiva/sclera: Conjunctivae normal.  Cardiovascular:     Rate and Rhythm: Normal rate and regular rhythm.  Pulmonary:     Effort: Pulmonary effort is normal.     Breath sounds: Normal breath sounds.  Lymphadenopathy:     Cervical: No cervical adenopathy.  Skin:    General: Skin is warm and dry.  Neurological:     Mental Status: She is alert.  Psychiatric:        Mood and Affect: Mood normal.      Musculoskeletal Exam:  Shoulders full ROM no tenderness or swelling Elbows full ROM no tenderness or swelling Wrists full ROM  no tenderness or swelling Fingers full ROM no tenderness or swelling Knees full ROM no tenderness or swelling, right knee patellofemoral crepitus present Ankles full ROM no tenderness or swelling    Investigation: No additional findings.  Imaging: No results found.  Recent Labs: Lab Results  Component Value Date   WBC 9.3 07/01/2022   HGB 11.9 (L) 07/01/2022   PLT 509.0 (H) 07/01/2022   NA 139 10/13/2021   K 3.7 10/13/2021   CL 106 10/13/2021   CO2 23 10/13/2021   GLUCOSE 110 (H) 10/13/2021   BUN 8 10/13/2021   CREATININE 0.78 10/13/2021   BILITOT 0.4 03/19/2021   ALKPHOS 72 03/19/2021   AST 26 03/19/2021   ALT 19 03/19/2021   PROT 7.8 03/19/2021   ALBUMIN 3.8 03/19/2021   CALCIUM 9.2 10/13/2021   GFRAA 123 02/19/2019    Speciality Comments: No specialty comments available.  Procedures:  No procedures performed Allergies: Food, Pollinex-t [modified tree tyrosine adsorbate], and Quinine   Assessment / Plan:     Visit Diagnoses: Sedimentation rate elevation - Plan: Cyclic citrul peptide antibody, IgG,  Sedimentation rate, Serum protein electrophoresis with reflex, IgG, IgA, IgM, C3 and C4  The previous sedimentation rate was pretty markedly elevated I discussed I do not think that is typical to be accounted for by just obesity though that can cause small elevation inflammatory markers.  No specific clinical criteria for autoimmune connective tissue disease noted on exam today.  Will check additional labs including repeat of the sedimentation rate also check CCP antibodies quantitative immunoglobulins and serum protein electrophoresis for evidence of systemic immune activation.  Other fatigue  Extremely longstanding has history of iron deficiency anemia otherwise somewhat nonspecific.  Pituitary adenoma but no previous documentation indicating thyroid dysfunction or abnormal cortisol levels.   Crepitus of joint of right knee  Discussed knee crepitus is indicative usually for early osteoarthritis of the knee but not having a lot of symptoms currently.  Discussed modifiable risk factors are her weight and conditioning and leg strength.  Orders: Orders Placed This Encounter  Procedures   Cyclic citrul peptide antibody, IgG   Sedimentation rate   Serum protein electrophoresis with reflex   IgG, IgA, IgM   C3 and C4   No orders of the defined types were placed in this encounter.    Follow-Up Instructions: Return if symptoms worsen or fail to improve.   Fuller Plan, MD  Note - This record has been created using [employer].  Chart creation errors have been sought, but may not always  have been located. Such creation errors do not reflect on  the standard of medical care.

## 2022-08-18 ENCOUNTER — Ambulatory Visit: Payer: 59 | Admitting: Obstetrics and Gynecology

## 2022-08-27 ENCOUNTER — Ambulatory Visit: Payer: 59 | Admitting: Physician Assistant

## 2022-08-30 ENCOUNTER — Telehealth: Payer: Self-pay | Admitting: Physician Assistant

## 2022-08-30 MED ORDER — COLESTIPOL HCL 1 G PO TABS: 1.0000 g | ORAL_TABLET | Freq: Two times a day (BID) | ORAL | 0 refills | Status: DC

## 2022-08-30 NOTE — Telephone Encounter (Signed)
Rx for colestipol sent to [facility] as requested.
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
## 2022-09-03 ENCOUNTER — Ambulatory Visit: Payer: 59 | Admitting: Physician Assistant

## 2022-09-12 NOTE — Progress Notes (Deleted)
Patient Care Team: Deeann Saint, MD as PCP - General (Family Medicine) Ardell Isaacs, Forrestine Him, MD as Consulting Physician (Obstetrics and Gynecology)   CHIEF COMPLAINT: Follow up anemia   CURRENT THERAPY: Oral iron and IV iron as needed   INTERVAL HISTORY Ms. Newman Nip returns for follow up as scheduled. Last seen by Dr. Mosetta Putt 03/16/22  ROS   Past Medical History:  Diagnosis Date  . 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 Anemia   . Anxiety   . Depression   . Elevated ferritin level   . G6PD deficiency 01/04/2021  . Heel spur, left   . IBS (irritable bowel syndrome)   . Low iron   . Morbid obesity (HCC)   . Seasonal allergies   . Sickle cell trait (HCC)   . Vitamin D deficiency      Past Surgical History:  Procedure Laterality Date  . ACHILLES TENDON LENGTHENING Left   . BLEPHAROPLASTY Right    lower lid x 3  . CHOLECYSTECTOMY  2016  . DILATATION & CURETTAGE/HYSTEROSCOPY WITH MYOSURE N/A 07/13/2021   Procedure: DILATATION & CURETTAGE/HYSTEROSCOPY;  Surgeon: Patton Salles, MD;  Location: [facility] OR;  Service: Gynecology;  Laterality: N/A;  . ELECTROLYSIS OF MISDIRECTED LASHES Right 08/04/2017   Procedure: ELECTROLYSIS OF MISDIRECTED LASHES RIGHT EYE;  Surgeon: Floydene Flock, MD;  Location: [facility] OR;  Service: Ophthalmology;  Laterality: Right;  . ENTROPIAN REPAIR Right 08/04/2017   Procedure: REPAIR OF ENTROPION WITH TARSAL STRIP RIGHT EYE;  Surgeon: Floydene Flock, MD;  Location: [facility] OR;  Service: Ophthalmology;  Laterality: Right;  . HEEL SPUR RESECTION Left   . HYSTEROSCOPY WITH D & C N/A 06/27/2017   Procedure: DILATATION & CURETTAGE/HYSTEROSCOPY;  Surgeon: Patton Salles, MD;  Location: [facility] ORS;  Service: Gynecology;  Laterality: N/A;  . iud placement     been removed  . TENDON REPAIR Left 05/21/2020   Procedure: TENOLYSIS OF LEFT FOOT;  Surgeon: Park Liter, DPM;  Location: [facility] ORS;  Service: Podiatry;  Laterality: Left;     Outpatient  Encounter Medications as of 09/16/2022  Medication Sig  . acetaminophen (TYLENOL) 500 MG tablet Take 1 tablet (500 mg total) by mouth every 6 (six) hours as needed for mild pain or moderate pain.  . cabergoline (DOSTINEX) 0.5 MG tablet TAKE HALF  TABLET  BY MOUTH ONCE A WEEK  . cetirizine (ZYRTEC) 10 MG tablet Take 10 mg by mouth daily.  . Cholecalciferol (VITAMIN D3) 50 MCG (2000 UT) capsule Take 6,000 Units by mouth daily.  . colestipol (COLESTID) 1 g tablet Take 1 tablet (1 g total) by mouth 2 (two) times daily.  . Cyanocobalamin (B-12 PO) Take 1,000 mcg by mouth daily.  . Ferrous Sulfate (IRON PO) Take 125 mg by mouth daily.  Marland Kitchen KLS ALLER-FLO 50 MCG/ACT nasal spray Place 2 sprays into both nostrils daily.  Marland Kitchen lisdexamfetamine (VYVANSE) 30 MG chewable tablet Chew by mouth.  . norethindrone (AYGESTIN) 5 MG tablet Take 1 tablet (5 mg total) by mouth 2 (two) times daily.  Marland Kitchen OVER THE COUNTER MEDICATION SSS Tonic Takes 3 tbsp daily  . Prenatal Vit-Fe Fumarate-FA (MULTIVITAMIN-PRENATAL) 27-0.8 MG TABS tablet Take 1 tablet by mouth daily at 12 noon.  Jeananne Rama Sulfate (ALLERGY RELIEF EYE DROPS OP) Place 1 drop into both eyes daily.  . Vitamin D, Ergocalciferol, (DRISDOL) 1.25 MG (50000 UNIT) CAPS capsule Take 1 capsule (50,000 Units total) by mouth every 7 (  seven) days. (Patient not taking: Reported on 07/29/2022)  . VYVANSE 20 MG capsule Take 20 mg by mouth daily. (Patient not taking: Reported on 07/29/2022)   No facility-administered encounter medications on file as of 09/16/2022.     There were no vitals filed for this visit. There is no height or weight on file to calculate BMI.   PHYSICAL EXAM GENERAL:alert, no distress and comfortable SKIN: no rash  EYES: sclera clear NECK: without mass LYMPH:  no palpable cervical or supraclavicular lymphadenopathy  LUNGS: clear with normal breathing effort HEART: regular rate & rhythm, no lower extremity edema ABDOMEN: abdomen soft,  non-tender and normal bowel sounds NEURO: alert & oriented x 3 with fluent speech, no focal motor/sensory deficits Breast exam:  PAC without erythema    CBC    Component Value Date/Time   WBC 9.3 07/01/2022 0852   RBC 5.52 (H) 07/01/2022 0852   HGB 11.9 (L) 07/01/2022 0852   HGB 12.1 03/16/2022 0901   HGB 12.3 01/10/2019 1356   HGB 10.9 (L) 12/17/2016 1348   HCT 37.6 07/01/2022 0852   HCT 33.4 (L) 03/19/2021 1329   HCT 35.1 12/17/2016 1348   PLT 509.0 (H) 07/01/2022 0852   PLT 598 (H) 03/16/2022 0901   PLT 407 01/10/2019 1356   MCV 68.2 (L) 07/01/2022 0852   MCV 74 (L) 01/10/2019 1356   MCV 66.1 (L) 12/17/2016 1348   MCH 21.7 (L) 03/16/2022 0901   MCHC 31.8 07/01/2022 0852   RDW 18.0 (H) 07/01/2022 0852   RDW 14.7 01/10/2019 1356   RDW 22.3 (H) 12/17/2016 1348   LYMPHSABS 1.9 07/01/2022 0852   LYMPHSABS 1.7 12/17/2016 1348   MONOABS 0.4 07/01/2022 0852   MONOABS 0.2 12/17/2016 1348   EOSABS 0.2 07/01/2022 0852   EOSABS 0.2 12/17/2016 1348   BASOSABS 0.0 07/01/2022 0852   BASOSABS 0.0 12/17/2016 1348     CMP     Component Value Date/Time   NA 139 10/13/2021 0937   NA 139 02/19/2019 1409   K 3.7 10/13/2021 0937   CL 106 10/13/2021 0937   CO2 23 10/13/2021 0937   GLUCOSE 110 (H) 10/13/2021 0937   BUN 8 10/13/2021 0937   BUN 11 02/19/2019 1409   CREATININE 0.78 10/13/2021 0937   CREATININE 0.72 03/19/2021 1328   CREATININE 0.75 08/09/2019 1156   CALCIUM 9.2 10/13/2021 0937   PROT 7.2 07/29/2022 1011   PROT 7.5 02/19/2019 1409   ALBUMIN 3.8 03/19/2021 1328   ALBUMIN 4.3 02/19/2019 1409   AST 26 03/19/2021 1328   ALT 19 03/19/2021 1328   ALKPHOS 72 03/19/2021 1328   BILITOT 0.4 03/19/2021 1328   GFRNONAA >60 10/13/2021 0937   GFRNONAA >60 03/19/2021 1328   GFRAA 123 02/19/2019 1409     ASSESSMENT & PLAN:  PLAN:  No orders of the defined types were placed in this encounter.     All questions were answered. The patient knows to call the clinic with  any problems, questions or concerns. No barriers to learning were detected. I spent *** counseling the patient face to face. The total time spent in the appointment was *** and more than 50% was on counseling, review of test results, and coordination of care.   Santiago Glad, NP-C @DATE @

## 2022-09-15 ENCOUNTER — Other Ambulatory Visit: Payer: Self-pay | Admitting: Nurse Practitioner

## 2022-09-15 ENCOUNTER — Other Ambulatory Visit: Payer: Self-pay

## 2022-09-15 DIAGNOSIS — D500 Iron deficiency anemia secondary to blood loss (chronic): Secondary | ICD-10-CM

## 2022-09-16 ENCOUNTER — Inpatient Hospital Stay: Payer: 59 | Admitting: Nurse Practitioner

## 2022-09-16 ENCOUNTER — Inpatient Hospital Stay: Payer: 59 | Attending: Nurse Practitioner

## 2022-09-20 ENCOUNTER — Other Ambulatory Visit: Payer: Self-pay | Admitting: Obstetrics and Gynecology

## 2022-09-20 NOTE — Telephone Encounter (Signed)
Rx for Aygestin 5 mg po bid.  #60, RF none.

## 2022-09-20 NOTE — Telephone Encounter (Signed)
Med refill request: Aygestin Last AEX: 06/25/21 Next AEX: 10/04/22 Last MMG (if hormonal med) n/a Refill authorized: Please Advise, #180, 0 RF

## 2022-09-30 NOTE — Progress Notes (Signed)
37 y.o. G0P0000 Married [place] American female here for annual exam.    Patient is taking Aygestin 5 mg twice daily to control bleeding.  She has bleeding the is not predictable.  It occurs bleeding 4 times per month, and it will last for 1 day.  No missed dosages.   Wonders if the Colestipol is interfering with the Aygestin.  They use condoms for pregnancy prevention.   She is not ready for pregnancy yet.   Had expulsion of 2 Mirena IUDs.  Has not tried Nexplanon.   PCP:  Dr. [facility]     No LMP recorded. (Menstrual status: Oral contraceptives).           Sexually active: Yes.    The current method of family planning is condoms.  Exercising: No.   Smoker:  no  Health Maintenance: Pap:  04/28/17 neg: HR HPV neg History of abnormal Pap:  no MMG:  n/a Colonoscopy:  n/a BMD:   n/a  Result  n/a TDaP:  07/01/17 Gardasil:   yes, 1/3 HIV: 10/13/21 NR  Hep C:04-28-17 Neg  Screening Labs:   PCP, endocrinology.    reports that she has never smoked. She has never been exposed to tobacco smoke. She has never used smokeless tobacco. She reports that she does not currently use alcohol. She reports that she does not use drugs.  Past Medical History:  Diagnosis Date   Adenoma of pituitary (HCC) 08/2014   Anemia    Anxiety    Depression    Elevated ferritin level    G6PD deficiency 01/04/2021   Heel spur, left    IBS (irritable bowel syndrome)    Low iron    Morbid obesity (HCC)    Seasonal allergies    Sickle cell trait (HCC)    Vitamin D deficiency     Past Surgical History:  Procedure Laterality Date   ACHILLES TENDON LENGTHENING Left    BLEPHAROPLASTY Right    lower lid x 3   CHOLECYSTECTOMY  2016   DILATATION & CURETTAGE/HYSTEROSCOPY WITH MYOSURE N/A 07/13/2021   Procedure: DILATATION & CURETTAGE/HYSTEROSCOPY;  Surgeon: Patton Salles, MD;  Location: [facility] OR;  Service: Gynecology;  Laterality: N/A;   ELECTROLYSIS OF MISDIRECTED LASHES  Right 08/04/2017   Procedure: ELECTROLYSIS OF MISDIRECTED LASHES RIGHT EYE;  Surgeon: Floydene Flock, MD;  Location: [facility] OR;  Service: Ophthalmology;  Laterality: Right;   ENTROPIAN REPAIR Right 08/04/2017   Procedure: REPAIR OF ENTROPION WITH TARSAL STRIP RIGHT EYE;  Surgeon: Floydene Flock, MD;  Location: [facility] OR;  Service: Ophthalmology;  Laterality: Right;   HEEL SPUR RESECTION Left    HYSTEROSCOPY WITH D & C N/A 06/27/2017   Procedure: DILATATION & CURETTAGE/HYSTEROSCOPY;  Surgeon: Patton Salles, MD;  Location: [facility] ORS;  Service: Gynecology;  Laterality: N/A;   iud placement     been removed   TENDON REPAIR Left 05/21/2020   Procedure: TENOLYSIS OF LEFT FOOT;  Surgeon: Park Liter, DPM;  Location: [facility] ORS;  Service: Podiatry;  Laterality: Left;    Current Outpatient Medications  Medication Sig Dispense Refill   acetaminophen (TYLENOL) 500 MG tablet Take 1 tablet (500 mg total) by mouth every 6 (six) hours as needed for mild pain or moderate pain. 30 tablet 0   amphetamine-dextroamphetamine (ADDERALL) 10 MG tablet Take 10 mg by mouth daily.     cabergoline (DOSTINEX) 0.5 MG tablet TAKE HALF  TABLET  BY MOUTH ONCE A WEEK 10  tablet 3   cetirizine (ZYRTEC) 10 MG tablet Take 10 mg by mouth daily.     Cholecalciferol (VITAMIN D3) 50 MCG (2000 UT) capsule Take 6,000 Units by mouth daily.     colestipol (COLESTID) 1 g tablet Take 1 tablet (1 g total) by mouth 2 (two) times daily. 180 tablet 0   Cyanocobalamin (B-12 PO) Take 1,000 mcg by mouth daily.     Ferrous Sulfate (IRON PO) Take 125 mg by mouth daily.     KLS ALLER-FLO 50 MCG/ACT nasal spray Place 2 sprays into both nostrils daily.     lisdexamfetamine (VYVANSE) 30 MG chewable tablet Chew by mouth.     norethindrone (AYGESTIN) 5 MG tablet TAKE ONE TABLET BY MOUTH TWICE DAILY 60 tablet 0   OVER THE COUNTER MEDICATION SSS Tonic Takes 3 tbsp daily     Prenatal Vit-Fe Fumarate-FA (MULTIVITAMIN-PRENATAL) 27-0.8 MG TABS tablet  Take 1 tablet by mouth daily at 12 noon.     Tetrahydrozoline-Zn Sulfate (ALLERGY RELIEF EYE DROPS OP) Place 1 drop into both eyes daily.     Vitamin D, Ergocalciferol, (DRISDOL) 1.25 MG (50000 UNIT) CAPS capsule Take 1 capsule (50,000 Units total) by mouth every 7 (seven) days. 12 capsule 0   VYVANSE 20 MG capsule Take 20 mg by mouth daily.     No current facility-administered medications for this visit.    Family History  Adopted: Yes  Problem Relation Age of Onset   Sickle cell trait Mother    Heart disease Father    Obesity Father    Colon cancer Neg Hx    Esophageal cancer Neg Hx    Stomach cancer Neg Hx    Sleep apnea Neg Hx     Review of Systems  All other systems reviewed and are negative.   Exam:   BP 128/76 (BP Location: Right Arm, Patient Position: Sitting, Cuff Size: Large)   Pulse (!) 106   Ht 5\' 8"  (1.727 m)   Wt (!) 377 lb (171 kg)   SpO2 96%   BMI 57.32 kg/m     General appearance: alert, cooperative and appears stated age Head: normocephalic, without obvious abnormality, atraumatic Neck: no adenopathy, supple, symmetrical, trachea midline and thyroid normal to inspection and palpation Lungs: clear to auscultation bilaterally Breasts: normal appearance, no masses or tenderness, No nipple retraction or dimpling, No nipple discharge or bleeding, No axillary adenopathy Heart: regular rate and rhythm Abdomen: soft, non-tender; no masses, no organomegaly Extremities: extremities normal, atraumatic, no cyanosis or edema Skin: skin color, texture, turgor normal. No rashes or lesions Lymph nodes: cervical, supraclavicular, and axillary nodes normal. Neurologic: grossly normal  Pelvic: External genitalia:  no lesions              No abnormal inguinal nodes palpated.              Urethra:  normal appearing urethra with no masses, tenderness or lesions              Bartholins and Skenes: normal                 Vagina: normal appearing vagina with normal color and  discharge, no lesions              Cervix: ectropion of cervix?              Pap taken: yes Bimanual Exam:  Uterus:  normal size, contour, position, consistency, mobility, non-tender  Adnexa: no mass, fullness, tenderness          Chaperone was present for exam:  Senaida Ores, CMA  Assessment:   Well woman visit with gynecologic exam. Status post hysteroscopy with dilation and curettage, 07/2021.  Final pathology:  benign polyp. Hx abnormal uterine bleeding controlled on Aygestin 5 mg twice daily.  Alpha thalassemia.  Sickle cell trait. 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  Chronic anemia.  BMI 57.32.  Plan: Mammogram screening discussed. Self breast awareness reviewed. Pap and HR HPV collected.  Guidelines for Calcium, Vitamin D, regular exercise program including cardiovascular and weight bearing exercise. Aygestin 5 mg, take 2 tablets (10 mg) by mouth daily.  #180, RF 3.  I recommend her husband will check his sickle cell status.  We talked about weight loss possibly through her PCP, an organized weight loss clinic or surgical care.  She will discuss with her husband and determine a plan for her.  Follow up annually and prn.

## 2022-10-04 ENCOUNTER — Ambulatory Visit: Payer: 59 | Admitting: Obstetrics and Gynecology

## 2022-10-11 ENCOUNTER — Other Ambulatory Visit (HOSPITAL_COMMUNITY)
Admission: RE | Admit: 2022-10-11 | Discharge: 2022-10-11 | Disposition: A | Discharge status: Home / Self Care | Payer: 59 | Source: Ambulatory Visit | Attending: Obstetrics and Gynecology | Admitting: Obstetrics and Gynecology

## 2022-10-11 ENCOUNTER — Ambulatory Visit (INDEPENDENT_AMBULATORY_CARE_PROVIDER_SITE_OTHER): Payer: 59 | Admitting: Obstetrics and Gynecology

## 2022-10-11 ENCOUNTER — Encounter: Payer: Self-pay | Admitting: Obstetrics and Gynecology

## 2022-10-11 VITALS — BP 128/76 | HR 106 | Ht 68.0 in | Wt 377.0 lb

## 2022-10-11 DIAGNOSIS — Z124 Encounter for screening for malignant neoplasm of cervix: Secondary | ICD-10-CM | POA: Diagnosis present

## 2022-10-11 DIAGNOSIS — Z01419 Encounter for gynecological examination (general) (routine) without abnormal findings: Secondary | ICD-10-CM | POA: Diagnosis not present

## 2022-10-11 DIAGNOSIS — Z5181 Encounter for therapeutic drug level monitoring: Secondary | ICD-10-CM

## 2022-10-11 MED ORDER — NORETHINDRONE ACETATE 5 MG PO TABS: ORAL_TABLET | ORAL | 3 refills | Status: DC

## 2022-10-11 NOTE — Patient Instructions (Signed)

## 2022-10-14 LAB — CYTOLOGY - PAP
Comment: NEGATIVE
Diagnosis: NEGATIVE
High risk HPV: NEGATIVE

## 2022-10-15 IMAGING — CR DG ELBOW COMPLETE 3+V*L*
4 series · 4 of 4 positions shown · non-contrast
Comparison: None.

CLINICAL DATA: Left arm pain

EXAM:
LEFT ELBOW - COMPLETE 3+ VIEW

[x elbow ap left]
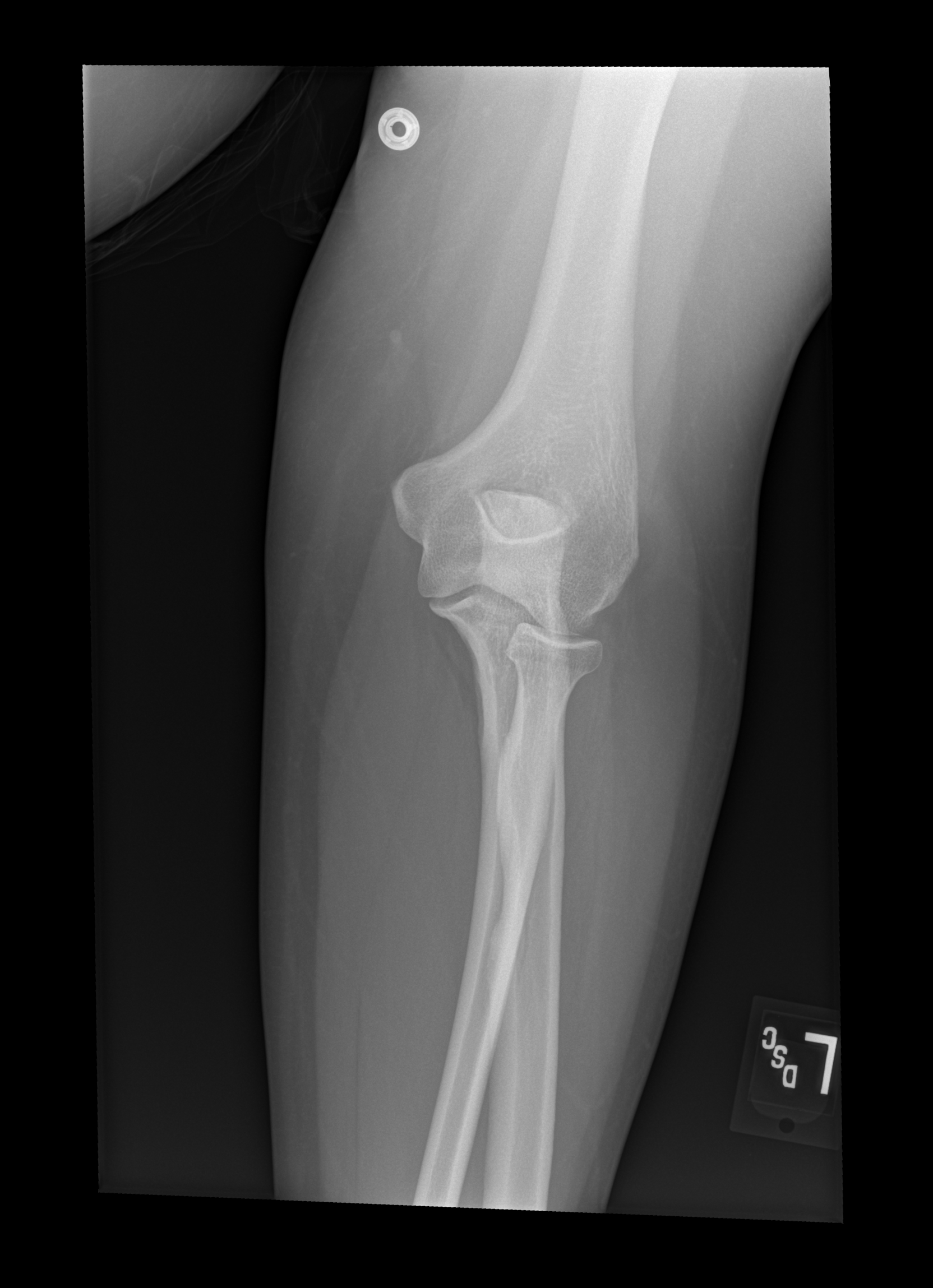

[x elbow obl left (1 of 2)]
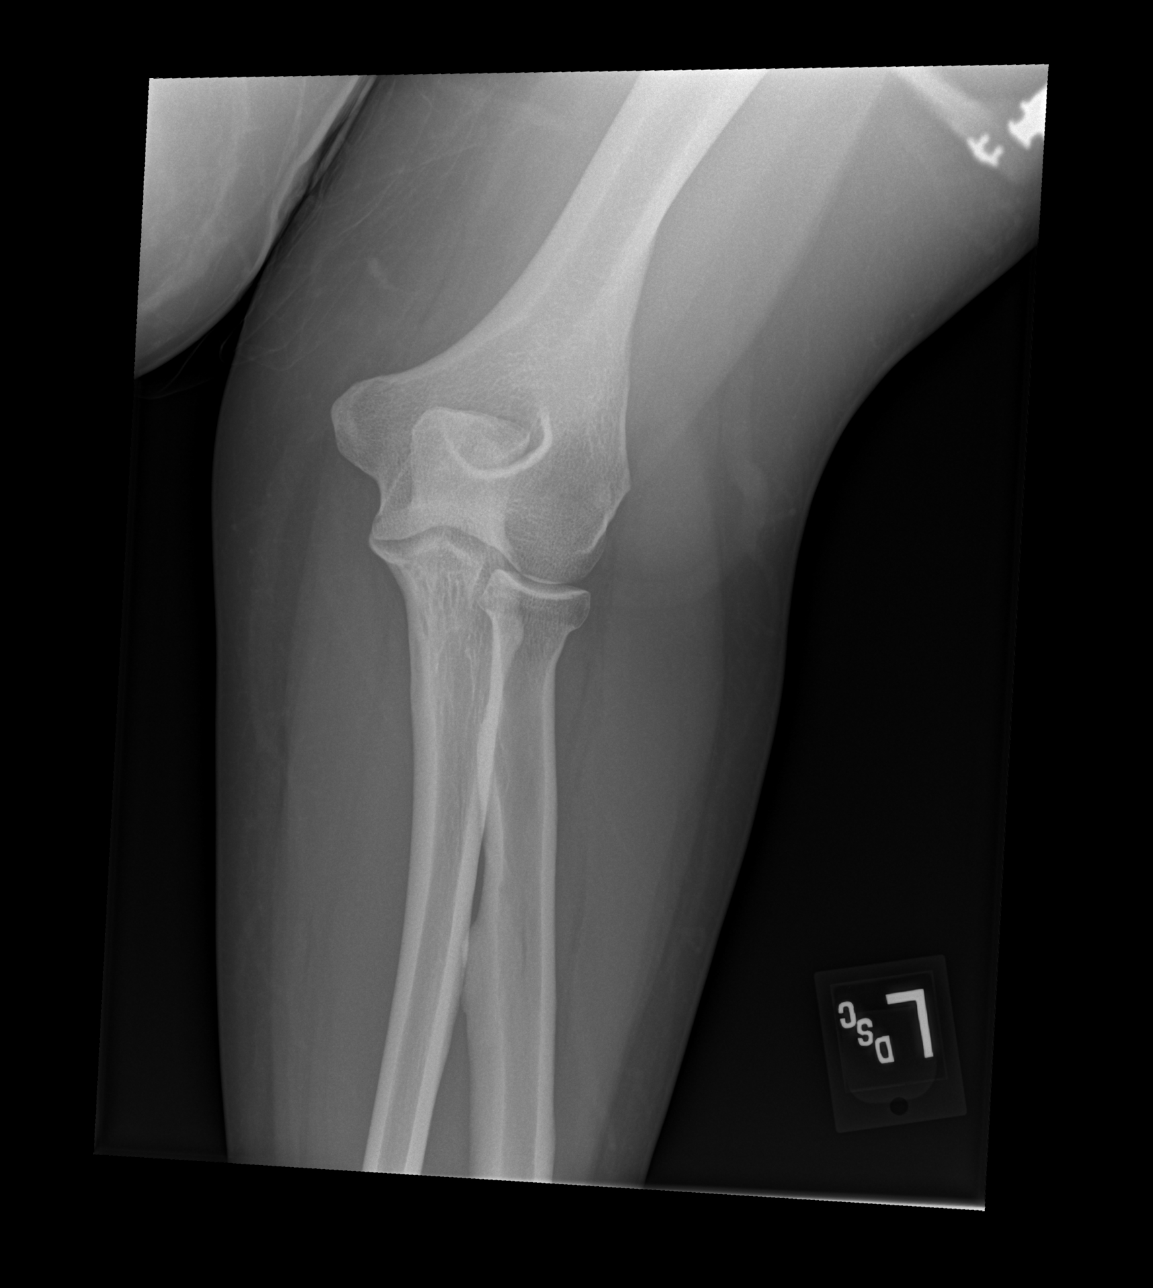

[x elbow obl left (2 of 2)]
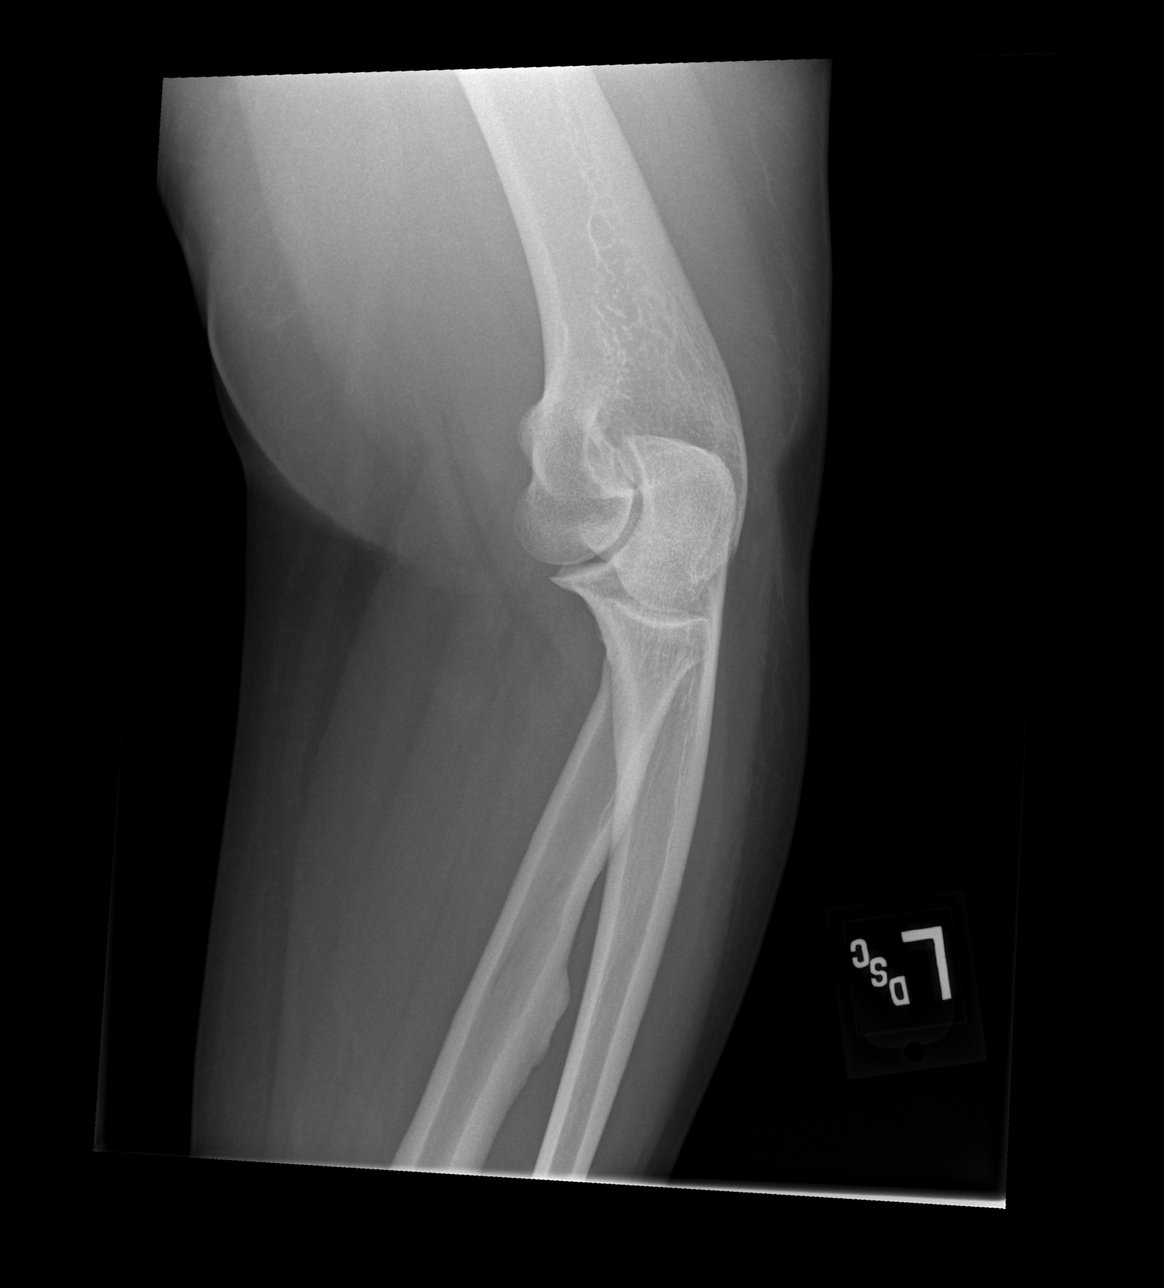

[x elbow lat left]
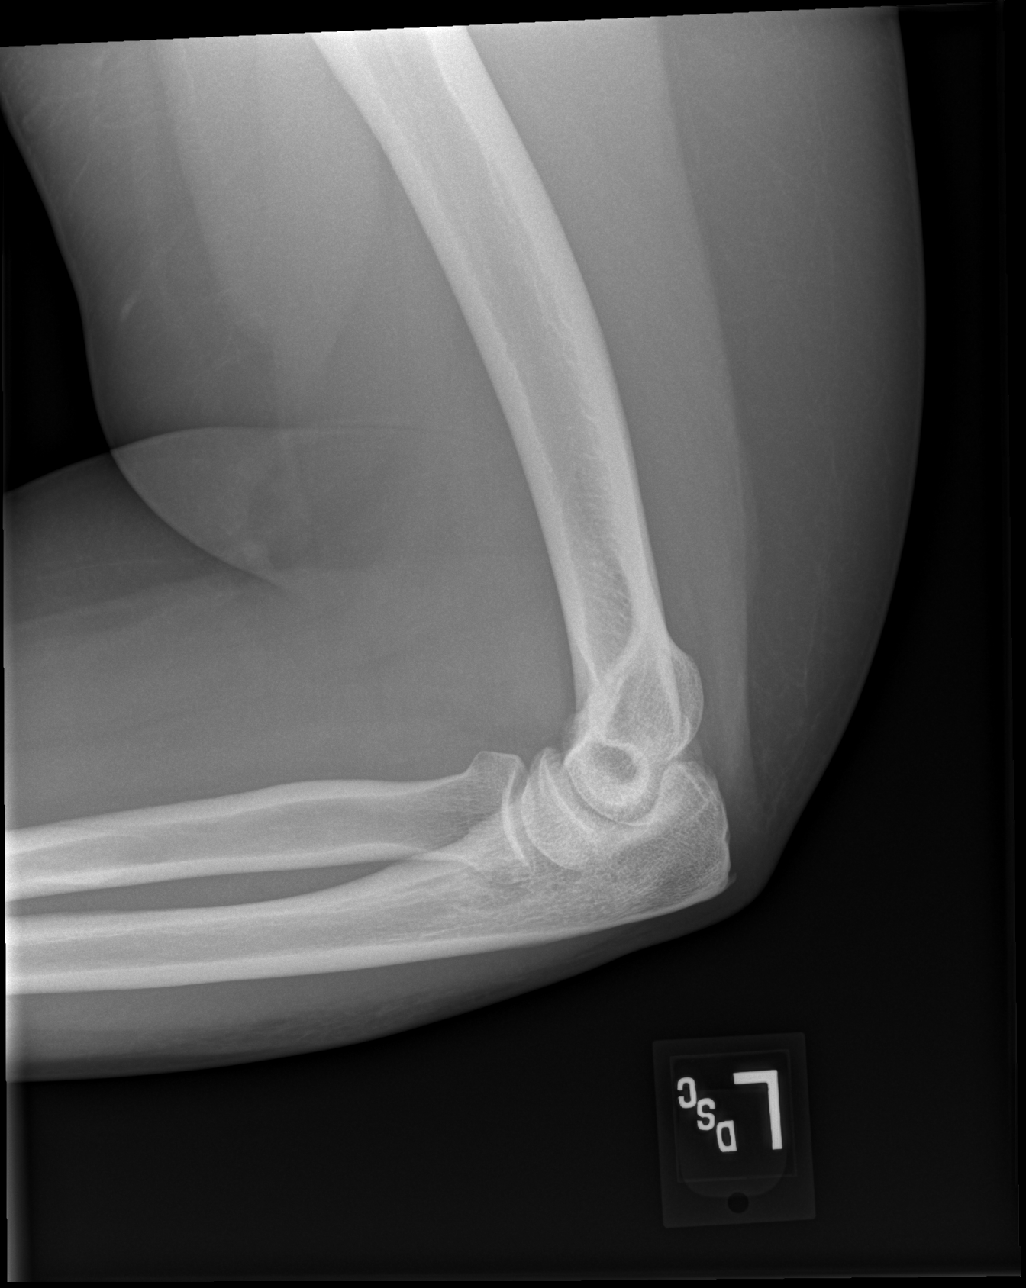

[4 of 4 positions shown; findings below may reference images not displayed]

FINDINGS: No fracture or dislocation is seen.

The joint spaces are preserved.

Visualized soft tissues are within normal limits.

No displaced elbow joint fat pads to suggest an elbow joint
effusion.
IMPRESSION: Negative.

## 2022-10-15 IMAGING — CR DG SHOULDER 2+V*L*
3 series · 3 of 3 positions shown · non-contrast
Comparison: None.

CLINICAL DATA: Left arm pain

EXAM:
LEFT SHOULDER - 2+ VIEW

[w shoulder external left]
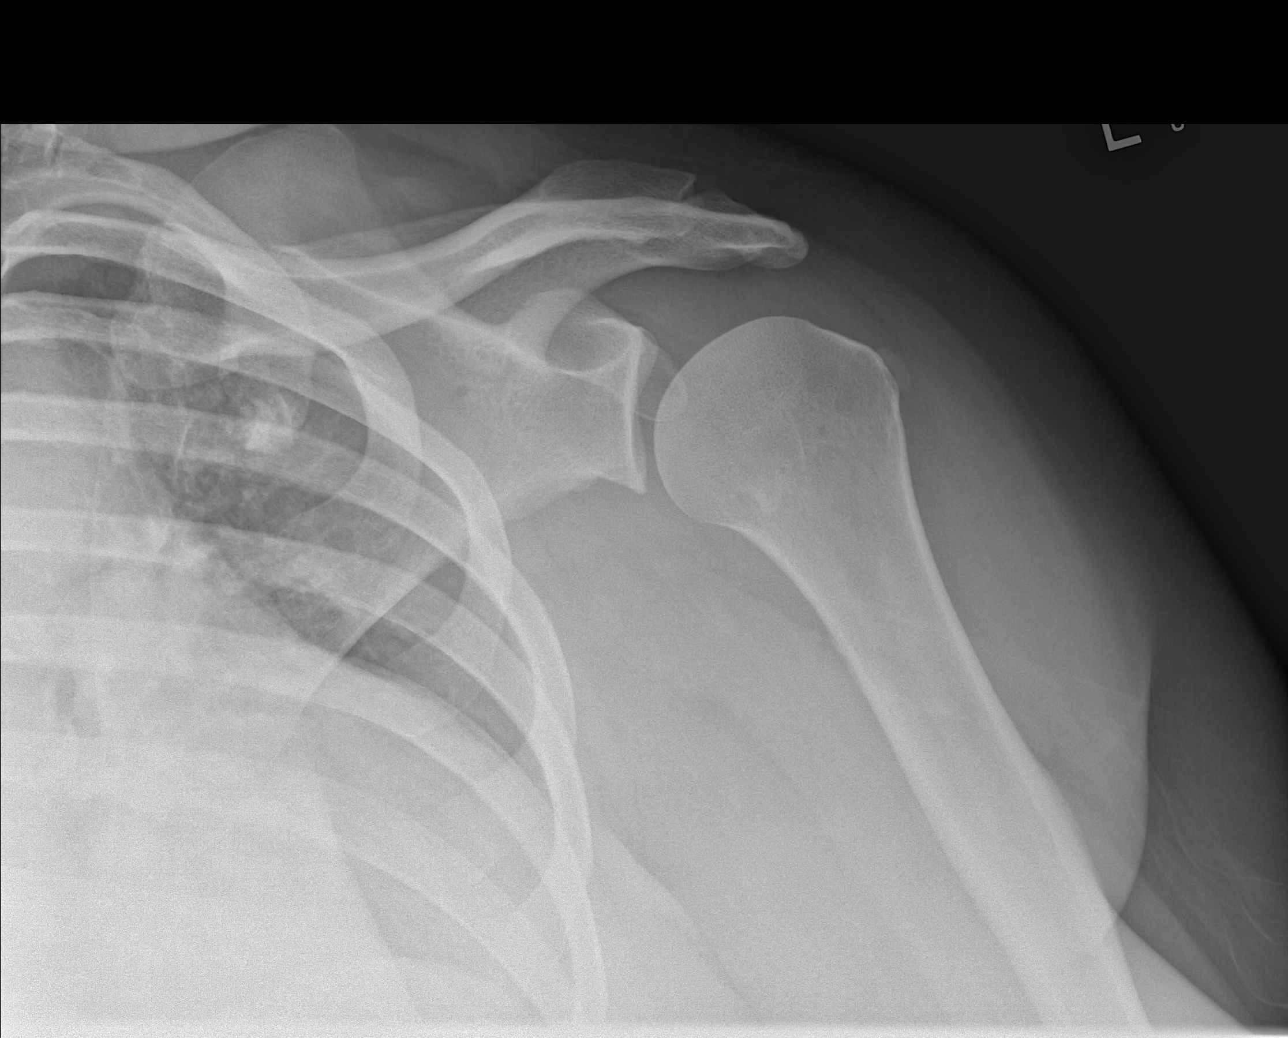

[w shoulder y-view left]
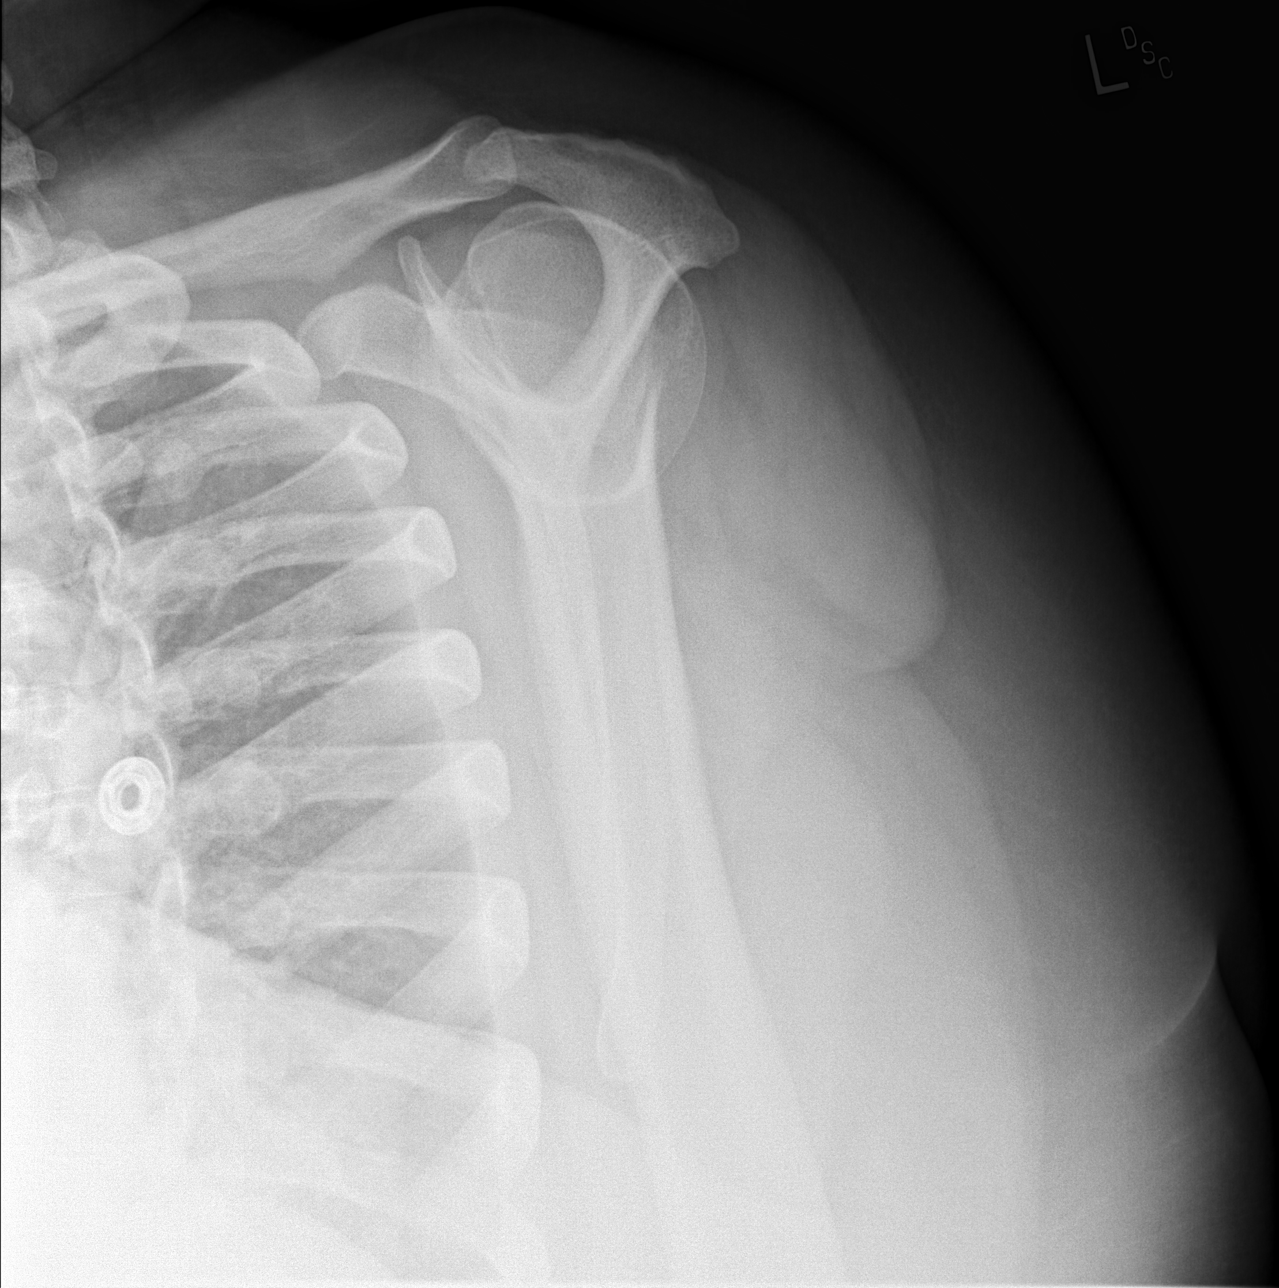

[x shoulder axillary left]
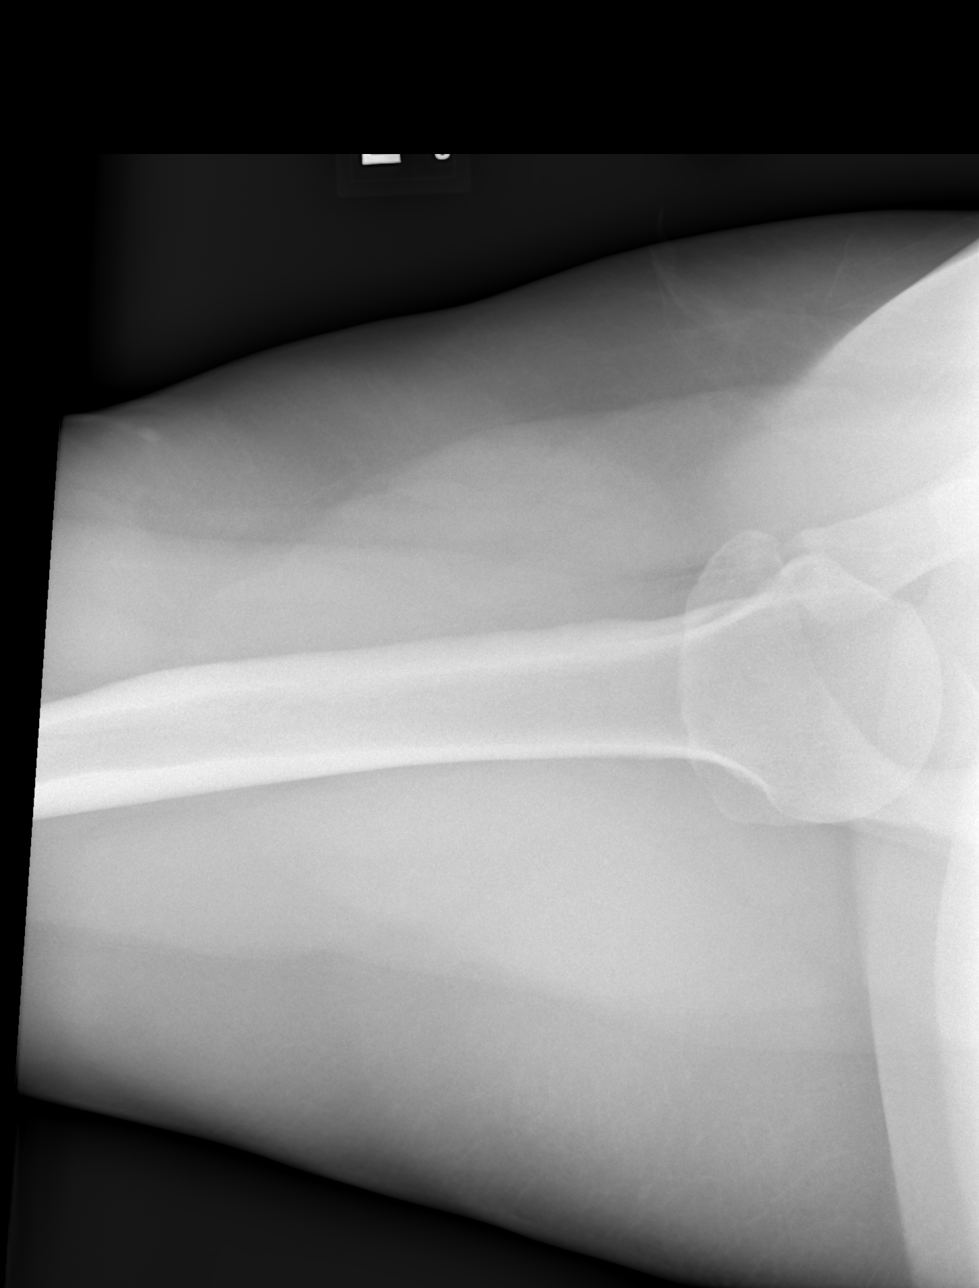

[3 of 3 positions shown; findings below may reference images not displayed]

FINDINGS: No fracture or dislocation is seen.

The joint spaces are preserved.

The visualized soft tissues are unremarkable.

Visualized left lung is clear.
IMPRESSION: Negative.

## 2022-11-10 ENCOUNTER — Encounter: Payer: Self-pay | Admitting: Physician Assistant

## 2022-11-10 ENCOUNTER — Ambulatory Visit: Payer: 59 | Admitting: Physician Assistant

## 2022-11-10 VITALS — BP 134/70 | HR 90 | Ht 67.0 in | Wt 373.4 lb

## 2022-11-10 DIAGNOSIS — K9089 Other intestinal malabsorption: Secondary | ICD-10-CM | POA: Diagnosis not present

## 2022-11-10 MED ORDER — COLESTIPOL HCL 1 G PO TABS: 1.0000 g | ORAL_TABLET | Freq: Two times a day (BID) | ORAL | 4 refills | Status: DC

## 2022-11-10 NOTE — Patient Instructions (Signed)
We have sent the following medications to your pharmacy for you to pick up at your convenience: Colestipol   _______________________________________________________  If your blood pressure at your visit was 140/90 or greater, please contact your primary care physician to follow up on this.  _______________________________________________________  If you are age 35 or older, your body mass index should be between 23-30. Your Body mass index is 58.48 kg/m. If this is out of the aforementioned range listed, please consider follow up with your Primary Care Provider.  If you are age 45 or younger, your body mass index should be between 19-25. Your Body mass index is 58.48 kg/m. If this is out of the aformentioned range listed, please consider follow up with your Primary Care Provider.   ________________________________________________________  The [facility] providers would like to encourage you to use [facility] to communicate with providers for non-urgent requests or questions.  Due to long hold times on the telephone, sending your provider a message by [facility] may be a faster and more efficient way to get a response.  Please allow 48 business hours for a response.  Please remember that this is for non-urgent requests.  _______________________________________________________ I appreciate the  opportunity to care for you  Thank You   Amy Myrtie Hawk

## 2022-11-10 NOTE — Progress Notes (Signed)
Subjective:    Patient ID: Ana Padilla, female    DOB: 07/09/1985, 37 y.o.   MRN: [id number]  HPI Ana Padilla is a pleasant 37 year old African-American female, established with Dr. Marina Goodell and also known to myself.  She comes in today for routine follow-up and medication refill. She was last seen in May 2023, and has been followed for bile salt induced diarrhea.  She has been managed with colestipol 1 g once or twice daily. She says that she continues to do well and as long as she stays on the colestipol she has not had any issues with diarrhea.  She generally has a bowel movement once daily which is formed.  Most days she takes 1 g daily and occasionally depending on her oral intake etc. will take a second dose.  She is trying to take this at least 2 hours away from any of her other medications.   Otherwise has been doing well, her other primary frustration is with her weight, and inability to lose weight.  She has been seen at the weight management clinic, her insurance will not cover most of the medications.  No family history of colon cancer, routine risk. Other issues include alpha thalassemia trait, sickle cell trait, history of iron deficiency anemia secondary to menorrhagia, history of pituitary adenoma, and is status post cholecystectomy.  Review of Systems Pertinent positive and negative review of systems were noted in the above HPI section.  All other review of systems was otherwise negative.   Outpatient Encounter Medications as of 11/10/2022  Medication Sig   acetaminophen (TYLENOL) 500 MG tablet Take 1 tablet (500 mg total) by mouth every 6 (six) hours as needed for mild pain or moderate pain.   amphetamine-dextroamphetamine (ADDERALL) 10 MG tablet Take 10 mg by mouth daily.   cabergoline (DOSTINEX) 0.5 MG tablet TAKE HALF  TABLET  BY MOUTH ONCE A WEEK   cetirizine (ZYRTEC) 10 MG tablet Take 10 mg by mouth daily.   Cholecalciferol (VITAMIN D3) 50 MCG (2000 UT) capsule Take 6,000  Units by mouth daily.   Cyanocobalamin (B-12 PO) Take 1,000 mcg by mouth daily.   Ferrous Sulfate (IRON PO) Take 125 mg by mouth daily.   KLS ALLER-FLO 50 MCG/ACT nasal spray Place 2 sprays into both nostrils daily.   lisdexamfetamine (VYVANSE) 30 MG chewable tablet Chew by mouth.   norethindrone (AYGESTIN) 5 MG tablet Take 2 tablet (10 mg) by mouth daily.   OVER THE COUNTER MEDICATION SSS Tonic Takes 3 tbsp daily   Prenatal Vit-Fe Fumarate-FA (MULTIVITAMIN-PRENATAL) 27-0.8 MG TABS tablet Take 1 tablet by mouth daily at 12 noon.   Tetrahydrozoline-Zn Sulfate (ALLERGY RELIEF EYE DROPS OP) Place 1 drop into both eyes daily.   VYVANSE 20 MG capsule Take 20 mg by mouth daily.   [DISCONTINUED] colestipol (COLESTID) 1 g tablet Take 1 tablet (1 g total) by mouth 2 (two) times daily.   colestipol (COLESTID) 1 g tablet Take 1 tablet (1 g total) by mouth 2 (two) times daily.   Vitamin D, Ergocalciferol, (DRISDOL) 1.25 MG (50000 UNIT) CAPS capsule Take 1 capsule (50,000 Units total) by mouth every 7 (seven) days. (Patient not taking: Reported on 11/10/2022)   No facility-administered encounter medications on file as of 11/10/2022.   Allergies  Allergen Reactions   Food Other (See Comments)    Seasonal Allergies & fresh fruit---itchy mouth&nose    Pollinex-T [Modified Tree Tyrosine Adsorbate]     Itchy nose and eyes, ears, runny nose,  Quinine Other (See Comments)    g6pd deficiency   Patient Active Problem List   Diagnosis Date Noted   Sedimentation rate elevation 07/29/2022   Abnormal laboratory test result 07/29/2022   Crepitus of joint of right knee 07/29/2022   Vitamin D deficiency 03/18/2022   Adopted 03/18/2022   Other fatigue 03/18/2022   Thrombocytosis 03/18/2022   ADHD 03/10/2022   Alpha thalassemia trait 07/31/2021   Tendonitis, Achilles, left    Bile salt-induced diarrhea 12/20/2019   Dysfunction of left eustachian tube 05/25/2018   Cervical polyp 04/28/2017   Morbid obesity  (HCC) 04/28/2017   Hyperprolactinemia (HCC) 02/11/2017   Pituitary adenoma (HCC) 02/11/2017   Sickle cell trait (HCC) 02/01/2017   Iron deficiency anemia due to chronic blood loss 11/04/2016   Social History   Socioeconomic History   Marital status: Married    Spouse name: Not on file   Number of children: 0   Years of education: Not on file   Highest education level: Associate degree: occupational, Scientist, product/process development, or vocational program  Occupational History   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  Tobacco Use   Smoking status: Never    Passive exposure: Never   Smokeless tobacco: Never  Vaping Use   Vaping status: Never Used  Substance and Sexual Activity   Alcohol use: Not Currently   Drug use: No   Sexual activity: Yes    Partners: Male    Birth control/protection: Pill, Condom  Other Topics Concern   Not on file  Social History Narrative   Not on file   Social Determinants of Health   Financial Resource Strain: Low Risk  (03/18/2022)   Overall Financial Resource Strain (CARDIA)    Difficulty of Paying Living Expenses: Not hard at all  Food Insecurity: Patient Declined (03/18/2022)   Hunger Vital Sign    Worried About Running Out of Food in the Last Year: Patient declined    Ran Out of Food in the Last Year: Patient declined  Transportation Needs: Patient Declined (03/18/2022)   PRAPARE - Administrator, Civil Service (Medical): Patient declined    Lack of Transportation (Non-Medical): Patient declined  Physical Activity: Unknown (03/18/2022)   Exercise Vital Sign    Days of Exercise per Week: Patient declined    Minutes of Exercise per Session: Not on file  Stress: No Stress Concern Present (03/18/2022)   [employer]a [facility] - Occupational Stress Questionnaire    Feeling of Stress : Not at all  Social Connections: Unknown (03/18/2022)   Social Connection and Isolation Panel [NHANES]    Frequency of Communication with Friends and Family:  Patient declined    Frequency of Social Gatherings with Friends and Family: Patient declined    Attends Religious Services: Patient declined    Database administrator or Organizations: No    Attends Engineer, structural: Not on file    Marital Status: Married  Catering manager Violence: Not on file    Ms. Gritz's family history includes Heart disease in her father; Obesity in her father; Sickle cell trait in her mother. She was adopted.      Objective:    Vitals:   11/10/22 1004  BP: 134/70  Pulse: 90    Physical Exam Well-developed well-nourished AA female  in no acute distress.  Height, Weight,58.48 BMI 58.48  HEENT; nontraumatic normocephalic, EOMI, PE R LA, sclera anicteric. Oropharynx; not examined today Neck; supple, no JVD Cardiovascular; regular rate and rhythm with S1-S2, no  murmur rub or gallop Pulmonary; Clear bilaterally Abdomen; soft, obese, nontender nondistended, no palpable mass or hepatosplenomegaly, bowel sounds are active Rectal; not done today Skin; benign exam, no jaundice rash or appreciable lesions Extremities; no clubbing cyanosis or edema skin warm and dry Neuro/Psych; alert and oriented x4, grossly nonfocal mood and affect appropriate        Assessment & Plan:   #61 37 year old female with bile salt induced diarrhea, currently well-controlled on colestipol 1 g daily most days and occasionally using a second dose.  #2 status post cholecystectomy #3.  Morbid obesity-BMI 58 #4 alpha thalassemia trait #5.  Sickle cell trait #6.  History of pituitary adenoma #7.  History of iron deficiency anemia secondary to menorrhagia  Plan; continue colestipol 1 g 1-2 times daily, refill x 1 year Patient will follow-up in 1 year with Dr. Marina Goodell or myself, or sooner as needed.  Artavis Cowie Oswald Hillock PA-C 11/10/2022   Cc: Deeann Saint, MD

## 2022-11-11 NOTE — Progress Notes (Signed)
Noted  

## 2023-03-01 ENCOUNTER — Ambulatory Visit: Payer: 59 | Admitting: Internal Medicine

## 2023-03-30 ENCOUNTER — Ambulatory Visit: Admitting: Family Medicine

## 2023-03-30 DIAGNOSIS — D500 Iron deficiency anemia secondary to blood loss (chronic): Secondary | ICD-10-CM

## 2023-03-30 DIAGNOSIS — D563 Thalassemia minor: Secondary | ICD-10-CM

## 2023-03-30 DIAGNOSIS — R5383 Other fatigue: Secondary | ICD-10-CM

## 2023-03-30 DIAGNOSIS — G4700 Insomnia, unspecified: Secondary | ICD-10-CM

## 2023-03-30 LAB — CBC WITH DIFFERENTIAL/PLATELET
Basophils Absolute: 0 10*3/uL (ref 0.0–0.1)
Basophils Relative: 0.3 % (ref 0.0–3.0)
Eosinophils Absolute: 0.2 10*3/uL (ref 0.0–0.7)
Eosinophils Relative: 3.2 % (ref 0.0–5.0)
HCT: 39.4 % (ref 36.0–46.0)
Hemoglobin: 12.9 g/dL (ref 12.0–15.0)
Lymphocytes Relative: 23.6 % (ref 12.0–46.0)
Lymphs Abs: 1.8 10*3/uL (ref 0.7–4.0)
MCHC: 32.9 g/dL (ref 30.0–36.0)
MCV: 73.7 fl — ABNORMAL LOW (ref 78.0–100.0)
Monocytes Absolute: 0.4 10*3/uL (ref 0.1–1.0)
Monocytes Relative: 6 % (ref 3.0–12.0)
Neutro Abs: 5 10*3/uL (ref 1.4–7.7)
Neutrophils Relative %: 66.9 % (ref 43.0–77.0)
Platelets: 523 10*3/uL — ABNORMAL HIGH (ref 150.0–400.0)
RBC: 5.35 Mil/uL — ABNORMAL HIGH (ref 3.87–5.11)
RDW: 15.1 % (ref 11.5–15.5)
WBC: 7.5 10*3/uL (ref 4.0–10.5)

## 2023-03-30 LAB — HEMOGLOBIN A1C: Hgb A1c MFr Bld: 5.5 % (ref 4.6–6.5)

## 2023-03-30 LAB — COMPREHENSIVE METABOLIC PANEL WITH GFR
ALT: 31 U/L (ref 0–35)
AST: 23 U/L (ref 0–37)
Albumin: 4.3 g/dL (ref 3.5–5.2)
Alkaline Phosphatase: 48 U/L (ref 39–117)
BUN: 8 mg/dL (ref 6–23)
CO2: 28 meq/L (ref 19–32)
Calcium: 9.7 mg/dL (ref 8.4–10.5)
Chloride: 103 meq/L (ref 96–112)
Creatinine, Ser: 0.88 mg/dL (ref 0.40–1.20)
GFR: 84.85 mL/min (ref >60.00)
Glucose, Bld: 94 mg/dL (ref 70–99)
Potassium: 3.9 meq/L (ref 3.5–5.1)
Sodium: 140 meq/L (ref 135–145)
Total Bilirubin: 0.7 mg/dL (ref 0.2–1.2)
Total Protein: 8 g/dL (ref 6.0–8.3)

## 2023-03-30 NOTE — Progress Notes (Signed)
 Established Patient Office Visit   Subjective  Patient ID: Ana Padilla, female    DOB: [birth year]  Age: 38 y.o. MRN: [id number]  Chief Complaint  Patient presents with   Fatigue    Pt reports fatigue She has started compound of Semaglutide  03/09/2023 Pt is requesting labs being checked today    Patient is a 38 year old female with pmh sig for obesity, vitamin D deficiency, ADHD, alpha thalassemia trait, bile salt induced diarrhea, hyperprolactinemia, pituitary adenoma, sickle cell trait, iron deficiency anemia due to chronic blood loss who was seen for follow-up.  Requesting labs as feeling increased fatigue x 3 months.  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  Taking semaglutide0.5 mg weekly.  Weighed 323 lbs when started medication 03/07/2023.  Has noticed decreased food noise and eating less pt also mentions Vyvanse may also be contributing to decreased appetite.  Not currently exercising due to fatigue.  No longer having heavy bleeding.  Wants to get serious about weight preparation for pregnancy.  Patient states sleep is still not good.    Patient Active Problem List   Diagnosis Date Noted   Sedimentation rate elevation 07/29/2022   Abnormal laboratory test result 07/29/2022   Crepitus of joint of right knee 07/29/2022   Vitamin D deficiency 03/18/2022   Adopted 03/18/2022   Other fatigue 03/18/2022   Thrombocytosis 03/18/2022   ADHD 03/10/2022   Alpha thalassemia trait 07/31/2021   Tendonitis, Achilles, left    Bile salt-induced diarrhea 12/20/2019   Dysfunction of left eustachian tube 05/25/2018   Cervical polyp 04/28/2017   Morbid obesity (HCC) 04/28/2017   Hyperprolactinemia (HCC) 02/11/2017   Pituitary adenoma (HCC) 02/11/2017   Sickle cell trait (HCC) 02/01/2017   Iron deficiency anemia due to chronic blood loss 11/04/2016   Past Medical History:  Diagnosis Date   Adenoma of pituitary (HCC) 08/2014   Anemia     Anxiety    Depression    Elevated ferritin level    G6PD deficiency 01/04/2021   Heel spur, left    IBS (irritable bowel syndrome)    Low iron    Morbid obesity (HCC)    Seasonal allergies    Sickle cell trait (HCC)    Vitamin D deficiency    Past Surgical History:  Procedure Laterality Date   ACHILLES TENDON LENGTHENING Left    BLEPHAROPLASTY Right    lower lid x 3   CHOLECYSTECTOMY  2016   DILATATION & CURETTAGE/HYSTEROSCOPY WITH MYOSURE N/A 07/13/2021   Procedure: DILATATION & CURETTAGE/HYSTEROSCOPY;  Surgeon: Patton Salles, MD;  Location: [facility] OR;  Service: Gynecology;  Laterality: N/A;   ELECTROLYSIS OF MISDIRECTED LASHES Right 08/04/2017   Procedure: ELECTROLYSIS OF MISDIRECTED LASHES RIGHT EYE;  Surgeon: Floydene Flock, MD;  Location: [facility] OR;  Service: Ophthalmology;  Laterality: Right;   ENTROPIAN REPAIR Right 08/04/2017   Procedure: REPAIR OF ENTROPION WITH TARSAL STRIP RIGHT EYE;  Surgeon: Floydene Flock, MD;  Location: [facility] OR;  Service: Ophthalmology;  Laterality: Right;   HEEL SPUR RESECTION Left    HYSTEROSCOPY WITH D & C N/A 06/27/2017   Procedure: DILATATION & CURETTAGE/HYSTEROSCOPY;  Surgeon: Patton Salles, MD;  Location: [facility] ORS;  Service: Gynecology;  Laterality: N/A;   iud placement     been removed   TENDON REPAIR Left 05/21/2020   Procedure: TENOLYSIS OF LEFT FOOT;  Surgeon: Park Liter, DPM;  Location: [facility] ORS;  Service: Podiatry;  Laterality: Left;   Social History   Tobacco Use   Smoking status: Never    Passive exposure: Never   Smokeless tobacco: Never  Vaping Use   Vaping status: Never Used  Substance Use Topics   Alcohol use: Not Currently   Drug use: No   Family History  Adopted: Yes  Problem Relation Age of Onset   Sickle cell trait Mother    Heart disease Father    Obesity Father    Colon cancer Neg Hx    Esophageal cancer Neg Hx    Stomach cancer Neg Hx    Sleep apnea Neg Hx    Allergies  Allergen  Reactions   Food Other (See Comments)    Seasonal Allergies & fresh fruit---itchy mouth&nose    Pollinex-T [Modified Tree Tyrosine Adsorbate]     Itchy nose and eyes, ears, runny nose,    Quinine Other (See Comments)    g6pd deficiency      ROS Negative unless stated above    Objective:     BP 130/72   Pulse (!) 101   Temp 98.2 F (36.8 C)   Ht 5\' 7"  (1.702 m)   Wt (!) 376 lb 6 oz (170.7 kg)   SpO2 100%   BMI 58.95 kg/m  BP Readings from Last 3 Encounters:  03/30/23 130/72  11/10/22 134/70  10/11/22 128/76   Wt Readings from Last 3 Encounters:  03/30/23 (!) 376 lb 6 oz (170.7 kg)  11/10/22 (!) 373 lb 6.4 oz (169.4 kg)  10/11/22 (!) 377 lb (171 kg)      Physical Exam Constitutional:      General: She is not in acute distress.    Appearance: Normal appearance.  HENT:     Head: Normocephalic and atraumatic.     Nose: Nose normal.     Mouth/Throat:     Mouth: Mucous membranes are moist.  Cardiovascular:     Rate and Rhythm: Normal rate and regular rhythm.     Heart sounds: Normal heart sounds. No murmur heard.    No gallop.  Pulmonary:     Effort: Pulmonary effort is normal. No respiratory distress.     Breath sounds: Normal breath sounds. No wheezing, rhonchi or rales.  Skin:    General: Skin is warm and dry.  Neurological:     Mental Status: She is alert and oriented to person, place, and time.     No results found for any visits on 03/30/23.    Assessment & Plan:  Morbid obesity (HCC) -Body mass index is 58.95 kg/m. -     Comprehensive metabolic panel  Iron deficiency anemia due to chronic blood loss -     CBC with Differential/Platelet -     Iron, TIBC and Ferritin Panel  Fatigue, unspecified type -     VITAMIN D 25 Hydroxy (Vit-D Deficiency, Fractures) -     CBC with Differential/Platelet -     Iron, TIBC and Ferritin Panel -     Hemoglobin A1c -     TSH -     T4, free -     Comprehensive metabolic panel  Alpha thalassemia trait -      Fructosamine  Insomnia  Patient with increased fatigue concerning for anemia or vitamin deficiency given prior history of iron deficiency anemia/vitamin D deficiency requiring IV iron supplementation.  Will obtain labs.  Patient encouraged to increase physical activity as this could help improve sleep and fatigue.  Patient advised may need  to look into other options to obtain semaglutide as compounded versions will soon no longer be available.  Continue follow-up with endocrinology and rheumatology.  Return in about 3 months (around 06/30/2023).   Deeann Saint, MD

## 2023-03-31 LAB — T4, FREE: Free T4: 0.97 ng/dL (ref 0.60–1.60)

## 2023-03-31 LAB — VITAMIN D 25 HYDROXY (VIT D DEFICIENCY, FRACTURES): VITD: 44.8 ng/mL (ref 30.00–100.00)

## 2023-03-31 LAB — TSH: TSH: 1.6 u[IU]/mL (ref 0.35–5.50)

## 2023-04-02 LAB — IRON,TIBC AND FERRITIN PANEL
%SAT: 24 % (ref 16–45)
Ferritin: 197 ng/mL — ABNORMAL HIGH (ref 16–154)
Iron: 85 ug/dL (ref 40–190)
TIBC: 358 ug/dL (ref 250–450)

## 2023-04-02 LAB — FRUCTOSAMINE: Fructosamine: 243 umol/L (ref 205–285)

## 2023-04-20 ENCOUNTER — Ambulatory Visit: Payer: 59 | Admitting: Internal Medicine

## 2023-04-26 ENCOUNTER — Encounter: Payer: Self-pay | Admitting: Family Medicine

## 2023-04-30 ENCOUNTER — Ambulatory Visit: Admitting: Podiatry

## 2023-05-27 ENCOUNTER — Ambulatory Visit: Admitting: Family Medicine

## 2023-06-08 ENCOUNTER — Ambulatory Visit: Admitting: Podiatry

## 2023-06-13 ENCOUNTER — Ambulatory Visit (INDEPENDENT_AMBULATORY_CARE_PROVIDER_SITE_OTHER)

## 2023-06-13 ENCOUNTER — Ambulatory Visit: Admitting: Podiatry

## 2023-06-13 ENCOUNTER — Encounter: Payer: Self-pay | Admitting: Podiatry

## 2023-06-13 VITALS — Ht 67.0 in | Wt 376.4 lb

## 2023-06-13 DIAGNOSIS — M722 Plantar fascial fibromatosis: Secondary | ICD-10-CM

## 2023-06-13 DIAGNOSIS — M7662 Achilles tendinitis, left leg: Secondary | ICD-10-CM

## 2023-06-13 MED ORDER — MELOXICAM 15 MG PO TABS: 15.0000 mg | ORAL_TABLET | Freq: Every day | ORAL | 1 refills | Status: AC

## 2023-06-13 MED ORDER — BETAMETHASONE SOD PHOS & ACET 6 (3-3) MG/ML IJ SUSP
3.0000 mg | Freq: Once | INTRAMUSCULAR | Status: AC
  Administered 2023-06-13: 3 mg via INTRA_ARTICULAR

## 2023-06-13 MED ORDER — METHYLPREDNISOLONE 4 MG PO TBPK: ORAL_TABLET | ORAL | 0 refills | Status: DC

## 2023-06-13 NOTE — Progress Notes (Signed)
 Chief Complaint  Patient presents with   Foot Pain    HPI: 38 y.o. female PMHx morbid obesity and PSxHx gastroc lengthening with retrocalcaneal exostectomy and repair of Achilles LT performed by Dr. Marina Shy, no longer with our practice, in a staged procedure.  Last DOS: 05/21/2020 presenting for evaluation of continued posterior and plantar heel pain to the left lower extremity.    Last seen in the office on 08/12/2021.  She continues to have pain and tenderness.  She no longer goes barefoot.  She wears a good supportive tennis shoes and sneakers.  Presenting for further treatment evaluation  Past Medical History:  Diagnosis Date   Adenoma of pituitary (HCC) 08/2014   Anemia    Anxiety    Depression    Elevated ferritin level    G6PD deficiency 01/04/2021   Heel spur, left    IBS (irritable bowel syndrome)    Low iron    Morbid obesity (HCC)    Seasonal allergies    Sickle cell trait (HCC)    Vitamin D  deficiency     Past Surgical History:  Procedure Laterality Date   ACHILLES TENDON LENGTHENING Left    BLEPHAROPLASTY Right    lower lid x 3   CHOLECYSTECTOMY  2016   DILATATION & CURETTAGE/HYSTEROSCOPY WITH MYOSURE N/A 07/13/2021   Procedure: DILATATION & CURETTAGE/HYSTEROSCOPY;  Surgeon: Greta Leatherwood, MD;  Location: [facility] OR;  Service: Gynecology;  Laterality: N/A;   ELECTROLYSIS OF MISDIRECTED LASHES Right 08/04/2017   Procedure: ELECTROLYSIS OF MISDIRECTED LASHES RIGHT EYE;  Surgeon: Karan Osgood, MD;  Location: [facility] OR;  Service: Ophthalmology;  Laterality: Right;   ENTROPIAN REPAIR Right 08/04/2017   Procedure: REPAIR OF ENTROPION WITH TARSAL STRIP RIGHT EYE;  Surgeon: Karan Osgood, MD;  Location: [facility] OR;  Service: Ophthalmology;  Laterality: Right;   HEEL SPUR RESECTION Left    HYSTEROSCOPY WITH D & C N/A 06/27/2017   Procedure: DILATATION & CURETTAGE/HYSTEROSCOPY;  Surgeon: Greta Leatherwood, MD;  Location: [facility];  Service: Gynecology;   Laterality: N/A;   iud placement     been removed   TENDON REPAIR Left 05/21/2020   Procedure: TENOLYSIS OF LEFT FOOT;  Surgeon: Camilo Cella, DPM;  Location: [facility]S;  Service: Podiatry;  Laterality: Left;    Allergies  Allergen Reactions   Food Other (See Comments)    Seasonal Allergies & fresh fruit---itchy mouth&nose    Pollinex-T [Modified Tree Tyrosine Adsorbate]     Itchy nose and eyes, ears, runny nose,    Quinine Other (See Comments)    g6pd deficiency     Physical Exam: General: The patient is alert and oriented x3 in no acute distress.  Dermatology: Skin is warm, dry and supple bilateral lower extremities. Negative for open lesions or macerations.  Unchanged mild hypertrophy of the scar to the posterior aspect of the ankle/Achilles tendon area with sensitivity.  Vascular: Palpable pedal pulses bilaterally. Capillary refill within normal limits.  Negative for any significant edema or erythema  Neurological: Light touch and protective threshold grossly intact  Musculoskeletal Exam: No pedal deformities noted.  Good ankle joint dorsiflexion.  Range of motion WNL.  There continues to be pain and tenderness associated to the plantar aspect of the left heel as well as the posterior aspect of the ankle along the Achilles tendon.  Radiographic Exam LT foot 06/13/2023:  Mostly unchanged.  Normal osseous mineralization.  Posterior exostectomy noted.  There is some ossicles  extending proximal to the posterior treatment will be calcaneus.  No acute fractures identified.  Assessment/Plan of Care: 1.  Chronic Achilles tendinitis left 2.  PSxHx retrocalcaneal exostectomy with repair of Achilles tendon and gastroc lengthening.  Dr. Marina Shy.  Staged.  Last DOS: 05/21/2020  -Chronic Achilles tendinitis after repair of the Achilles tendon posterior heel spur resection and gastroc lengthening.  Recommend conservative management for now -Recommend daily stretching exercises to stretch  and alleviate stress from the posterior aspect of the leg and Achilles tendon.  They were demonstrated today -Recommend shoes that do not irritate or constrict the posterior aspect of the heel -Prescription for Medrol  Dosepak -Prescription for meloxicam  15 mg daily after completion of the Dosepak  3.  Plantar fasciitis left  -Injection of 0.5 cc Celestone  Soluspan injected along the plantar fascia left -Meloxicam  that was prescribed should also help alleviate a lot of the plantar fascial pain -Recommend good supportive sneakers that provide arch support from [employer] -Continue supportive slides around the house.  Continue to refrain from going barefoot -Return to clinic 6 weeks     Dot Gazella, DPM [facility]  Dr. Dot Gazella, DPM    [address] [place], [place] [address]                Office [phone]  Fax [phone]

## 2023-07-15 ENCOUNTER — Ambulatory Visit: Admitting: Internal Medicine

## 2023-08-10 ENCOUNTER — Ambulatory Visit: Admitting: Internal Medicine

## 2023-09-07 ENCOUNTER — Ambulatory Visit: Admitting: Podiatry

## 2023-09-07 ENCOUNTER — Ambulatory Visit: Admitting: Internal Medicine

## 2023-09-28 ENCOUNTER — Ambulatory Visit: Admitting: Podiatry

## 2023-10-17 ENCOUNTER — Ambulatory Visit: Admitting: Podiatry

## 2023-10-19 ENCOUNTER — Telehealth: Payer: Self-pay | Admitting: Podiatry

## 2023-10-19 ENCOUNTER — Ambulatory Visit: Admitting: Family Medicine

## 2023-10-19 NOTE — Telephone Encounter (Signed)
 Called and left a message. Patients demographics need to be verified.

## 2023-10-20 ENCOUNTER — Encounter: Payer: Self-pay | Admitting: Radiology

## 2023-10-21 ENCOUNTER — Ambulatory Visit: Admitting: Radiology

## 2023-10-21 ENCOUNTER — Encounter: Payer: Self-pay | Admitting: Radiology

## 2023-10-21 VITALS — BP 126/88 | HR 87 | Temp 99.3°F | Wt 349.0 lb

## 2023-10-21 DIAGNOSIS — N898 Other specified noninflammatory disorders of vagina: Secondary | ICD-10-CM

## 2023-10-21 DIAGNOSIS — R350 Frequency of micturition: Secondary | ICD-10-CM | POA: Diagnosis not present

## 2023-10-21 LAB — PREGNANCY, URINE: Preg Test, Ur: NEGATIVE

## 2023-10-21 NOTE — Progress Notes (Signed)
      Subjective: Ana Padilla is a 38 y.o. female who complains of odor in urine (or vaginal odor), frequency, urgency, night voiding. Symptoms began 1 week ago. HX of recurrent BV. Would like UPT, late for menses. Missed aygestin  for a few days while traveling in August and had a 2 week period.   Patient's last menstrual period was 08/21/2023.   Review of Systems  All other systems reviewed and are negative.   Past Medical History:  Diagnosis Date   Adenoma of pituitary (HCC) 08/2014   Anemia    Anxiety    Depression    Elevated ferritin level    G6PD deficiency 01/04/2021   Heel spur, left    IBS (irritable bowel syndrome)    Low iron    Morbid obesity (HCC)    Seasonal allergies    Sickle cell trait    Vitamin D  deficiency       Objective:  Today's Vitals   10/21/23 1354  BP: 126/88  Pulse: 87  Temp: 99.3 F (37.4 C)  TempSrc: Oral  SpO2: 98%  Weight: (!) 349 lb (158.3 kg)   Body mass index is 54.66 kg/m.   Physical Exam Vitals and nursing note reviewed. Exam conducted with a chaperone present.  Constitutional:      Appearance: Normal appearance. She is well-developed. She is obese.  Pulmonary:     Effort: Pulmonary effort is normal.  Abdominal:     General: Abdomen is flat.     Palpations: Abdomen is soft.  Genitourinary:    General: Normal vulva.     Vagina: Vaginal discharge present. No erythema, bleeding or lesions.     Cervix: Normal. No discharge, friability, lesion or erythema.     Uterus: Normal.      Adnexa: Right adnexa normal and left adnexa normal.  Neurological:     Mental Status: She is alert.  Psychiatric:        Mood and Affect: Mood normal.        Thought Content: Thought content normal.        Judgment: Judgment normal.     Urine dipstick shows positive for RBC's and positive for ketones.  Micro exam: 0-2 RBC's per HPF.  Microscopic wet-mount exam shows negative for pathogens, normal epithelial cells.   Darice Hoit, CMA  present for exam  Assessment:/Plan:  1. Urinary frequency (Primary) - Urinalysis,Complete w/RFL Culture - Pregnancy, urine; neg  2. Vaginal odor - WET PREP FOR TRICH, YEAST, CLUE   Will contact patient with results of testing completed today. Avoid intercourse until symptoms are resolved. Safe sex encouraged. Avoid the use of soaps or perfumed products in the peri area. Avoid tub baths and sitting in sweaty or wet clothing for prolonged periods of time.     Deloria Brassfield B, NP 1:58 PM

## 2023-10-23 LAB — URINE CULTURE
MICRO NUMBER:: 17114220
Result:: NO GROWTH
SPECIMEN QUALITY:: ADEQUATE

## 2023-10-23 LAB — URINALYSIS, COMPLETE W/RFL CULTURE
Bacteria, UA: NONE SEEN /HPF
Bilirubin Urine: NEGATIVE
Glucose, UA: NEGATIVE
Hyaline Cast: NONE SEEN /LPF
Leukocyte Esterase: NEGATIVE
Nitrites, Initial: NEGATIVE
Protein, ur: NEGATIVE
Specific Gravity, Urine: 1.02 (ref 1.001–1.035)
WBC, UA: NONE SEEN /HPF (ref 0–5)
pH: 6 (ref 5.0–8.0)

## 2023-10-23 LAB — WET PREP FOR TRICH, YEAST, CLUE

## 2023-10-23 LAB — CULTURE INDICATED

## 2023-10-24 ENCOUNTER — Ambulatory Visit: Payer: Self-pay | Admitting: Radiology

## 2023-10-31 ENCOUNTER — Encounter: Payer: Self-pay | Admitting: Podiatry

## 2023-10-31 ENCOUNTER — Ambulatory Visit: Admitting: Podiatry

## 2023-10-31 VITALS — Ht 67.0 in | Wt 349.0 lb

## 2023-10-31 DIAGNOSIS — M722 Plantar fascial fibromatosis: Secondary | ICD-10-CM | POA: Diagnosis not present

## 2023-10-31 DIAGNOSIS — M7662 Achilles tendinitis, left leg: Secondary | ICD-10-CM | POA: Diagnosis not present

## 2023-10-31 MED ORDER — BETAMETHASONE SOD PHOS & ACET 6 (3-3) MG/ML IJ SUSP
3.0000 mg | Freq: Once | INTRAMUSCULAR | Status: AC
  Administered 2023-10-31: 3 mg via INTRA_ARTICULAR

## 2023-10-31 NOTE — Progress Notes (Signed)
 Chief Complaint  Patient presents with   Plantar Fasciitis    Pt is here to f/u on left foot due to plantar fasciitis, she states the the foot is better, pain is off and on, more annoying, states she continues to stretch and rest the foot when needed, still taking meloxicam , as needed. She thinks she may want to get another injection, has some questions regarding it.    HPI: 38 y.o. female PMHx morbid obesity and PSxHx gastroc lengthening with retrocalcaneal exostectomy and repair of Achilles LT performed by Dr. Ozell Domino, no longer with our practice, in a staged procedure.  Last DOS: 05/21/2020 presenting for follow-up evaluation of continued posterior and plantar heel pain to the left lower extremity.    Past Medical History:  Diagnosis Date   Adenoma of pituitary (HCC) 08/2014   Anemia    Anxiety    Depression    Elevated ferritin level    G6PD deficiency 01/04/2021   Heel spur, left    IBS (irritable bowel syndrome)    Low iron    Morbid obesity (HCC)    Seasonal allergies    Sickle cell trait    Vitamin D  deficiency     Past Surgical History:  Procedure Laterality Date   ACHILLES TENDON LENGTHENING Left    BLEPHAROPLASTY Right    lower lid x 3   CHOLECYSTECTOMY  2016   DILATATION & CURETTAGE/HYSTEROSCOPY WITH MYOSURE N/A 07/13/2021   Procedure: DILATATION & CURETTAGE/HYSTEROSCOPY;  Surgeon: Cathlyn JAYSON Nikki Bobie FORBES, MD;  Location: [facility] OR;  Service: Gynecology;  Laterality: N/A;   ELECTROLYSIS OF MISDIRECTED LASHES Right 08/04/2017   Procedure: ELECTROLYSIS OF MISDIRECTED LASHES RIGHT EYE;  Surgeon: Laurie Loyd Redhead, MD;  Location: [facility] OR;  Service: Ophthalmology;  Laterality: Right;   ENTROPIAN REPAIR Right 08/04/2017   Procedure: REPAIR OF ENTROPION WITH TARSAL STRIP RIGHT EYE;  Surgeon: Laurie Loyd Redhead, MD;  Location: [facility] OR;  Service: Ophthalmology;  Laterality: Right;   HEEL SPUR RESECTION Left    HYSTEROSCOPY WITH D & C N/A 06/27/2017   Procedure: DILATATION &  CURETTAGE/HYSTEROSCOPY;  Surgeon: Cathlyn JAYSON Nikki Bobie FORBES, MD;  Location: [facility]S;  Service: Gynecology;  Laterality: N/A;   iud placement     been removed   TENDON REPAIR Left 05/21/2020   Procedure: TENOLYSIS OF LEFT FOOT;  Surgeon: Domino Ozell PARAS, DPM;  Location: [facility];  Service: Podiatry;  Laterality: Left;    Allergies  Allergen Reactions   Food Other (See Comments)    Seasonal Allergies & fresh fruit---itchy mouth&nose    Pollinex-T [Modified Tree Tyrosine Adsorbate]     Itchy nose and eyes, ears, runny nose,    Quinine Other (See Comments)    g6pd deficiency     Physical Exam: General: The patient is alert and oriented x3 in no acute distress.  Dermatology: Skin is warm, dry and supple bilateral lower extremities. Negative for open lesions or macerations.  Unchanged mild hypertrophy of the scar to the posterior aspect of the ankle/Achilles tendon area with sensitivity.  Vascular: Palpable pedal pulses bilaterally. Capillary refill within normal limits.  Negative for any significant edema or erythema  Neurological: Light touch and protective threshold grossly intact  Musculoskeletal Exam: No pedal deformities noted.  Good ankle joint dorsiflexion.  Range of motion WNL.  There continues to mild tenderness associated to the plantar aspect of the left heel as well as the posterior aspect of the ankle along the Achilles tendon.  Radiographic Exam  LT foot 06/13/2023:  Mostly unchanged.  Normal osseous mineralization.  Posterior exostectomy noted.  There is some ossicles extending proximal to the posterior treatment will be calcaneus.  No acute fractures identified.  Assessment/Plan of Care: 1.  Chronic Achilles tendinitis left 2.  PSxHx retrocalcaneal exostectomy with repair of Achilles tendon and gastroc lengthening.  Dr. Ozell Domino.  Staged.  Last DOS: 05/21/2020  -Chronic Achilles tendinitis after repair of the Achilles tendon posterior heel spur resection and gastroc  lengthening.  Continue conservative management for now -Again today stressed the importance of daily stretching.  Recommend daily stretching exercises to stretch and alleviate stress from the posterior aspect of the leg and Achilles tendon.  They were demonstrated again today -Recommend shoes that do not irritate or constrict the posterior aspect of the heel -Continue meloxicam  15 mg daily PRN  3.  Plantar fasciitis left  -Injection of 0.5 cc Celestone  Soluspan injected along the plantar fascia left -Continue meloxicam  15 mg daily PRN.  Refills provided as needed -Continue good supportive sneakers that provide arch support from [employer] -Continue supportive slides around the house.  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 Thresa EMERSON Sar, DPM    [address] [place], [place] [address]                Office [phone]  Fax [phone]

## 2023-11-20 ENCOUNTER — Other Ambulatory Visit: Payer: Self-pay | Admitting: Obstetrics and Gynecology

## 2023-11-22 ENCOUNTER — Other Ambulatory Visit: Payer: Self-pay | Admitting: Obstetrics and Gynecology

## 2023-11-22 NOTE — Telephone Encounter (Signed)
*  FYI: Front desk will contact Pt to schedule an office visit / annual.   Med refill request: *norethindrone  (AYGESTIN ) 5 MG tablet  Start:  10/11/22 Disp:   180 tablets Refills:  3  Last AEX:  10/11/22 Next AEX:  Not yet scheduled Last MMG (if hormonal med):  N/A Refill authorized? Please Advise.

## 2023-11-28 NOTE — Telephone Encounter (Signed)
 Spoke with patient, AEX r/s from 05/08/24 to 01/24/24 at 0830 with Dr. Nikki.   Routing to provider for final review. Patient is agreeable to disposition. Will close encounter.

## 2023-12-04 ENCOUNTER — Other Ambulatory Visit: Payer: Self-pay | Admitting: Physician Assistant

## 2023-12-23 ENCOUNTER — Ambulatory Visit: Admitting: Family Medicine

## 2023-12-23 VITALS — BP 150/100 | HR 95 | Temp 99.3°F | Ht 67.0 in | Wt 353.2 lb

## 2023-12-23 DIAGNOSIS — K056 Periodontal disease, unspecified: Secondary | ICD-10-CM | POA: Diagnosis not present

## 2023-12-23 DIAGNOSIS — R030 Elevated blood-pressure reading, without diagnosis of hypertension: Secondary | ICD-10-CM | POA: Diagnosis not present

## 2023-12-23 DIAGNOSIS — R5383 Other fatigue: Secondary | ICD-10-CM

## 2023-12-23 DIAGNOSIS — F439 Reaction to severe stress, unspecified: Secondary | ICD-10-CM

## 2023-12-23 LAB — COMPREHENSIVE METABOLIC PANEL WITH GFR
ALT: 26 U/L (ref 3–35)
AST: 26 U/L (ref 5–37)
Albumin: 4.2 g/dL (ref 3.5–5.2)
Alkaline Phosphatase: 39 U/L (ref 39–117)
BUN: 10 mg/dL (ref 6–23)
CO2: 26 meq/L (ref 19–32)
Calcium: 9.3 mg/dL (ref 8.4–10.5)
Chloride: 103 meq/L (ref 96–112)
Creatinine, Ser: 0.74 mg/dL (ref 0.40–1.20)
GFR: 103.92 mL/min
Glucose, Bld: 93 mg/dL (ref 70–99)
Potassium: 4 meq/L (ref 3.5–5.1)
Sodium: 138 meq/L (ref 135–145)
Total Bilirubin: 0.7 mg/dL (ref 0.2–1.2)
Total Protein: 7.8 g/dL (ref 6.0–8.3)

## 2023-12-23 LAB — CBC WITH DIFFERENTIAL/PLATELET
Basophils Absolute: 0.1 K/uL (ref 0.0–0.1)
Basophils Relative: 0.7 % (ref 0.0–3.0)
Eosinophils Absolute: 0.3 K/uL (ref 0.0–0.7)
Eosinophils Relative: 2.9 % (ref 0.0–5.0)
HCT: 38.7 % (ref 36.0–46.0)
Hemoglobin: 12.5 g/dL (ref 12.0–15.0)
Lymphocytes Relative: 19.8 % (ref 12.0–46.0)
Lymphs Abs: 1.8 K/uL (ref 0.7–4.0)
MCHC: 32.4 g/dL (ref 30.0–36.0)
MCV: 70.8 fl — ABNORMAL LOW (ref 78.0–100.0)
Monocytes Absolute: 0.6 K/uL (ref 0.1–1.0)
Monocytes Relative: 6.2 % (ref 3.0–12.0)
Neutro Abs: 6.3 K/uL (ref 1.4–7.7)
Neutrophils Relative %: 70.4 % (ref 43.0–77.0)
Platelets: 472 K/uL — ABNORMAL HIGH (ref 150.0–400.0)
RBC: 5.47 Mil/uL — ABNORMAL HIGH (ref 3.87–5.11)
RDW: 17.4 % — ABNORMAL HIGH (ref 11.5–15.5)
WBC: 8.9 K/uL (ref 4.0–10.5)

## 2023-12-23 LAB — T4, FREE: Free T4: 0.89 ng/dL (ref 0.60–1.60)

## 2023-12-23 LAB — TSH: TSH: 0.95 u[IU]/mL (ref 0.35–5.50)

## 2023-12-23 LAB — VITAMIN D 25 HYDROXY (VIT D DEFICIENCY, FRACTURES): VITD: 22.53 ng/mL — ABNORMAL LOW (ref 30.00–100.00)

## 2023-12-23 LAB — VITAMIN B12: Vitamin B-12: 279 pg/mL (ref 211–911)

## 2023-12-23 MED ORDER — WEGOVY 0.5 MG/0.5ML ~~LOC~~ SOAJ: 0.5000 mg | SUBCUTANEOUS | 0 refills | Status: AC

## 2023-12-23 MED ORDER — WEGOVY 0.25 MG/0.5ML ~~LOC~~ SOAJ: 0.2500 mg | SUBCUTANEOUS | 0 refills | Status: AC

## 2023-12-23 NOTE — Progress Notes (Signed)
 "  Established Patient Office Visit   Subjective  Patient ID: Ana Padilla, female    DOB: 10-05-1985  Age: 38 y.o. MRN: [id number]  Chief Complaint  Patient presents with   Acute Visit    Patient is here because she wants her vitamin D  and iron levels checked due to feeling fatigued.  Patient has question about GLP-1 and a referral to dermatology.    Pt is a 38 yo female seen for f/u on ongoing concerns.  Pt endorses continued fatigue.  Though not as bad as typically is, feels off, like vitamins or iron is low.  Requesting labs.  Pt notes increased stress.  Her GF recently passed.  Restarting EMDR therapy next wk.  She had to have a tooth extracted and was told she has periodontal dz.  Pt to schedule appt with Periodontist who accepts her insurance.  States going to the dentist causes increased anxiety.  Pt still wants to get pregnant, but wants to be healthier. Not having regular periods.  On aygestin .   Fears dying due to wt related issues.  Previously on compounded GLP-1.  Found a rx program that reduces the cost of Wegovy  for the first few doses.      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   Pituitary adenoma (HCC) 02/11/2017   Sickle cell trait 02/01/2017   Iron deficiency anemia due to chronic blood loss 11/04/2016   Past Medical History:  Diagnosis Date   Adenoma of pituitary (HCC) 08/2014   Allergy    Anemia    Anxiety    Clotting disorder heavy  periods   Depression    Elevated ferritin level    G6PD deficiency 01/04/2021   Heel spur, left    IBS (irritable bowel syndrome)    Low iron    Morbid obesity (HCC)    Seasonal allergies    Sickle cell trait    Vitamin D  deficiency    Past Surgical History:  Procedure Laterality Date   ACHILLES TENDON LENGTHENING Left    BLEPHAROPLASTY Right    lower lid x 3   CHOLECYSTECTOMY  2016   DILATATION & CURETTAGE/HYSTEROSCOPY WITH MYOSURE N/A 07/13/2021   Procedure: DILATATION & CURETTAGE/HYSTEROSCOPY;  Surgeon: Cathlyn JAYSON Nikki Bobie FORBES, MD;  Location: [facility] OR;  Service: Gynecology;  Laterality: N/A;   ELECTROLYSIS OF MISDIRECTED LASHES Right 08/04/2017   Procedure: ELECTROLYSIS OF MISDIRECTED LASHES RIGHT EYE;  Surgeon: Laurie Loyd Redhead, MD;  Location[facility] OR;  Service: Ophthalmology;  Laterality: Right;   ENTROPIAN REPAIR Right 08/04/2017   Procedure: REPAIR OF ENTROPION WITH TARSAL STRIP RIGHT EYE;  Surgeon: Laurie Loyd Redhead, MD;  Location: [facility] OR;  Service: Ophthalmology;  Laterality: Right;   HEEL SPUR RESECTION Left    HYSTEROSCOPY WITH D & C N/A 06/27/2017   Procedure: DILATATION & CURETTAGE/HYSTEROSCOPY;  Surgeon: Cathlyn JAYSON Nikki Bobie FORBES, MD;  Location: [facility];  Service: Gynecology;  Laterality: N/A;   iud placement  been removed   TENDON REPAIR Left 05/21/2020   Procedure: TENOLYSIS OF LEFT FOOT;  Surgeon: Gretel Ozell PARAS, DPM;  Location: [facility];  Service: Podiatry;  Laterality: Left;   Social History[1] Family History  Adopted: Yes  Problem Relation Age of Onset   Sickle cell trait Mother    Heart disease Father    Obesity Father    Colon cancer Neg Hx    Esophageal cancer Neg Hx    Stomach cancer Neg Hx    Sleep apnea Neg Hx    Allergies[2]  ROS Negative unless stated above    Objective:     BP (!) 150/100 (BP Location: Left Arm, Patient Position: Sitting, Cuff Size: Large)   Pulse 95   Temp 99.3 F (37.4 C) (Oral)   Ht 5' 7 (1.702 m)   Wt (!) 353 lb  3.2 oz (160.2 kg)   SpO2 98%   BMI 55.32 kg/m  BP Readings from Last 3 Encounters:  12/23/23 (!) 150/100  10/21/23 126/88  03/30/23 130/72   Wt Readings from Last 3 Encounters:  12/23/23 (!) 353 lb 3.2 oz (160.2 kg)  10/31/23 (!) 349 lb (158.3 kg)  10/21/23 (!) 349 lb (158.3 kg)      Physical Exam Constitutional:      General: She is not in acute distress.    Appearance: Normal appearance.  HENT:     Head: Normocephalic and atraumatic.     Nose: Nose normal.     Mouth/Throat:     Mouth: Mucous membranes are moist.  Eyes:     Extraocular Movements: Extraocular movements intact.     Conjunctiva/sclera: Conjunctivae normal.  Cardiovascular:     Rate and Rhythm: Normal rate and regular rhythm.  Pulmonary:     Effort: Pulmonary effort is normal.     Breath sounds: Normal breath sounds.  Skin:    General: Skin is warm and dry.  Neurological:     Mental Status: She is alert and oriented to person, place, and time. Mental status is at baseline.        12/23/2023   12:13 PM 07/01/2022    8:22 AM 03/11/2022   11:22 AM  Depression screen PHQ 2/9  Decreased Interest 0 0 1  Down, Depressed, Hopeless 1 0 1  PHQ - 2 Score 1 0 2  Altered sleeping 1 1 1   Tired, decreased energy 1 3 1   Change in appetite 1 2 2   Feeling bad or failure about yourself  1 0 1  Trouble concentrating 0 1 1  Moving slowly or fidgety/restless 0 0 0  Suicidal thoughts 0 0 0  PHQ-9 Score 5 7  8    Difficult doing work/chores Somewhat difficult Very difficult Somewhat difficult     Data saved with a previous flowsheet row definition      12/23/2023   12:13 PM 07/01/2022    8:23 AM 12/10/2020    5:32 PM 11/13/2020   11:41 AM  GAD 7 : Generalized Anxiety Score  Nervous, Anxious, on Edge 1 1 3 2   Control/stop worrying 1 0 2 1  Worry too much - different things 1 0 2 2  Trouble relaxing 1 1 2 1   Restless 1 0 0 0  Easily annoyed or irritable 0 1 3 1   Afraid - awful might happen 0 0 0 1  Total GAD 7  Score 5 3 12 8   Anxiety Difficulty Somewhat difficult Somewhat difficult       No results  found for any visits on 12/23/23.    Assessment & Plan:   Fatigue, unspecified type -     CBC with Differential/Platelet; Future -     TSH; Future -     Iron, TIBC and Ferritin Panel; Future -     VITAMIN D  25 Hydroxy (Vit-D Deficiency, Fractures); Future -     Vitamin B12; Future -     Comprehensive metabolic panel with GFR; Future -     T4, free; Future  Elevated blood pressure reading without diagnosis of hypertension -     Comprehensive metabolic panel with GFR; Future  Morbid obesity (HCC) -     TSH; Future -     VITAMIN D  25 Hydroxy (Vit-D Deficiency, Fractures); Future -     Vitamin B12; Future -     Comprehensive metabolic panel with GFR; Future -     T4, free; Future -     Wegovy ; Inject 0.25 mg into the skin once a week.  Dispense: 2 mL; Refill: 0 -     Wegovy ; Inject 0.5 mg into the skin once a week.  Dispense: 2 mL; Refill: 0  Stress -     TSH; Future -     T4, free; Future  Periodontal disease  Acute on chronic fatigue.  Likely multifactorial.  Will check labs.  Replete if needed.  Advised to increased physical activity.  BP high.  Typically <130s/80s.  Recheck.  Monitor at home.  If consistently >140/90 start medication.  Diet changes including decreasing sodium intake.  Body mass index is 55.32 kg/m.  Restart Wegoy.  To get at reduced cost with medication program.  Restart therapy.  Self care.  Schedule appt with Periodontist.  Return in about 6 weeks (around 02/03/2024), or if symptoms worsen or fail to improve.   Clotilda JONELLE Single, MD     [1]  Social History Tobacco Use   Smoking status: Never    Passive exposure: Never   Smokeless tobacco: Never  Vaping Use   Vaping status: Never Used  Substance Use Topics   Alcohol use: Not Currently   Drug use: No  [2]  Allergies Allergen Reactions   Food Other (See Comments)    Seasonal Allergies & fresh  fruit---itchy mouth&nose    Pollinex-T [Modified Tree Tyrosine Adsorbate]     Itchy nose and eyes, ears, runny nose,    Quinine Other (See Comments)    g6pd deficiency   "

## 2023-12-24 LAB — IRON,TIBC AND FERRITIN PANEL
%SAT: 17 % (ref 16–45)
Ferritin: 186 ng/mL — ABNORMAL HIGH (ref 16–154)
Iron: 62 ug/dL (ref 40–190)
TIBC: 357 ug/dL (ref 250–450)

## 2024-01-20 ENCOUNTER — Ambulatory Visit: Payer: Self-pay | Admitting: Family Medicine

## 2024-01-20 DIAGNOSIS — E559 Vitamin D deficiency, unspecified: Secondary | ICD-10-CM

## 2024-01-20 MED ORDER — VITAMIN D (ERGOCALCIFEROL) 1.25 MG (50000 UNIT) PO CAPS: 50000.0000 [IU] | ORAL_CAPSULE | ORAL | 0 refills | Status: AC

## 2024-01-23 NOTE — Progress Notes (Unsigned)
 "  39 y.o. G0P0000 Married African American female here for annual exam.    PCP: Mercer Clotilda SAUNDERS, MD   No LMP recorded. (Menstrual status: Irregular Periods).           Sexually active: Yes.    The current method of family planning is OCP (estrogen/progesterone) and condoms.    Menopausal hormone therapy:  n/a Exercising: {yes no:314532}  {types:19826} Smoker:  no  OB History  Gravida Para Term Preterm AB Living  0 0 0 0 0 0  SAB IAB Ectopic Multiple Live Births  0 0 0 0 0     HEALTH MAINTENANCE: Last 2 paps:  10/11/22 neg, HR HPV neg, 04/28/17 neg, HPV neg  History of abnormal Pap or positive HPV:  no Mammogram:   n/a Colonoscopy:  n/a Bone Density:  n/a  Result  n/a   Immunization History  Administered Date(s) Administered   Influenza,inj,Quad PF,6+ Mos 10/17/2020   Influenza-Unspecified 09/23/2016   PFIZER(Purple Top)SARS-COV-2 Vaccination 10/19/2019, 11/10/2019   Tdap 07/01/2017      reports that she has never smoked. She has never been exposed to tobacco smoke. She has never used smokeless tobacco. She reports that she does not currently use alcohol. She reports that she does not use drugs.  Past Medical History:  Diagnosis Date   Adenoma of pituitary (HCC) 08/2014   Allergy    Anemia    Anxiety    Clotting disorder heavy periods   Depression    Elevated ferritin level    G6PD deficiency 01/04/2021   Heel spur, left    IBS (irritable bowel syndrome)    Low iron    Morbid obesity (HCC)    Seasonal allergies    Sickle cell trait    Vitamin D  deficiency     Past Surgical History:  Procedure Laterality Date   ACHILLES TENDON LENGTHENING Left    BLEPHAROPLASTY Right    lower lid x 3   CHOLECYSTECTOMY  2016   DILATATION & CURETTAGE/HYSTEROSCOPY WITH MYOSURE N/A 07/13/2021   Procedure: DILATATION & CURETTAGE/HYSTEROSCOPY;  Surgeon: Cathlyn JAYSON Nikki Bobie FORBES, MD;  Location: [facility] OR;  Service: Gynecology;  Laterality: N/A;   ELECTROLYSIS OF MISDIRECTED LASHES  Right 08/04/2017   Procedure: ELECTROLYSIS OF MISDIRECTED LASHES RIGHT EYE;  Surgeon: Laurie Loyd Redhead, MD;  Location: [facility] OR;  Service: Ophthalmology;  Laterality: Right;   ENTROPIAN REPAIR Right 08/04/2017   Procedure: REPAIR OF ENTROPION WITH TARSAL STRIP RIGHT EYE;  Surgeon: Laurie Loyd Redhead, MD;  Location: [facility] OR;  Service: Ophthalmology;  Laterality: Right;   HEEL SPUR RESECTION Left    HYSTEROSCOPY WITH D & C N/A 06/27/2017   Procedure: DILATATION & CURETTAGE/HYSTEROSCOPY;  Surgeon: Cathlyn JAYSON Nikki Bobie FORBES, MD;  Location: [facility];  Service: Gynecology;  Laterality: N/A;   iud placement     been removed   TENDON REPAIR Left 05/21/2020   Procedure: TENOLYSIS OF LEFT FOOT;  Surgeon: Gretel Ozell PARAS, DPM;  Location: [facility];  Service: Podiatry;  Laterality: Left;    Current Outpatient Medications  Medication Sig Dispense Refill   acetaminophen  (TYLENOL ) 500 MG tablet Take 1 tablet (500 mg total) by mouth every 6 (six) hours as needed for mild pain or moderate pain. 30 tablet 0   amphetamine-dextroamphetamine (ADDERALL) 10 MG tablet Take 10 mg by mouth daily. (Patient not taking: Reported on 12/23/2023)     cabergoline  (DOSTINEX ) 0.5 MG tablet TAKE HALF  TABLET  BY MOUTH ONCE A WEEK 10 tablet 3  cetirizine (ZYRTEC) 10 MG tablet Take 10 mg by mouth daily.     Cholecalciferol (VITAMIN D3) 50 MCG (2000 UT) capsule Take 6,000 Units by mouth daily.     colestipol  (COLESTID ) 1 g tablet TAKE ONE TABLET BY MOUTH TWICE DAILY 180 tablet 0   Cyanocobalamin  (B-12 PO) Take 1,000 mcg by mouth daily.     Ferrous Sulfate (IRON PO) Take 125 mg by mouth daily.     KLS ALLER-FLO 50 MCG/ACT nasal spray Place 2 sprays into both nostrils daily.     lisdexamfetamine (VYVANSE) 30 MG chewable tablet Chew by mouth.     methylPREDNISolone  (MEDROL  DOSEPAK) 4 MG TBPK tablet 6 day dose pack - take as directed (Patient not taking: Reported on 12/23/2023) 21 tablet 0   norethindrone  (AYGESTIN ) 5 MG tablet TAKE TWO TABLETS  BY MOUTH DAILY 180 tablet 0   OVER THE COUNTER MEDICATION SSS Tonic Takes 3 tbsp daily     Prenatal Vit-Fe Fumarate-FA (MULTIVITAMIN-PRENATAL) 27-0.8 MG TABS tablet Take 1 tablet by mouth daily at 12 noon.     semaglutide -weight management (WEGOVY ) 0.25 MG/0.5ML SOAJ SQ injection Inject 0.25 mg into the skin once a week. 2 mL 0   semaglutide -weight management (WEGOVY ) 0.5 MG/0.5ML SOAJ SQ injection Inject 0.5 mg into the skin once a week. 2 mL 0   Tetrahydrozoline-Zn Sulfate (ALLERGY RELIEF EYE DROPS OP) Place 1 drop into both eyes daily.     Vitamin D , Ergocalciferol , (DRISDOL ) 1.25 MG (50000 UNIT) CAPS capsule Take 1 capsule (50,000 Units total) by mouth every 7 (seven) days. 12 capsule 0   VYVANSE 20 MG capsule Take 20 mg by mouth daily.     No current facility-administered medications for this visit.    ALLERGIES: Food, Pollinex-t [modified tree tyrosine adsorbate], and Quinine  Family History  Adopted: Yes  Problem Relation Age of Onset   Sickle cell trait Mother    Heart disease Father    Obesity Father    Colon cancer Neg Hx    Esophageal cancer Neg Hx    Stomach cancer Neg Hx    Sleep apnea Neg Hx     Review of Systems  PHYSICAL EXAM:  There were no vitals taken for this visit.    General appearance: alert, cooperative and appears stated age Head: normocephalic, without obvious abnormality, atraumatic Neck: no adenopathy, supple, symmetrical, trachea midline and thyroid  normal to inspection and palpation Lungs: clear to auscultation bilaterally Breasts: normal appearance, no masses or tenderness, No nipple retraction or dimpling, No nipple discharge or bleeding, No axillary adenopathy Heart: regular rate and rhythm Abdomen: soft, non-tender; no masses, no organomegaly Extremities: extremities normal, atraumatic, no cyanosis or edema Skin: skin color, texture, turgor normal. No rashes or lesions Lymph nodes: cervical, supraclavicular, and axillary nodes  normal. Neurologic: grossly normal  Pelvic: External genitalia:  no lesions              No abnormal inguinal nodes palpated.              Urethra:  normal appearing urethra with no masses, tenderness or lesions              Bartholins and Skenes: normal                 Vagina: normal appearing vagina with normal color and discharge, no lesions              Cervix: no lesions  Pap taken: {yes no:314532} Bimanual Exam:  Uterus:  normal size, contour, position, consistency, mobility, non-tender              Adnexa: no mass, fullness, tenderness              Rectal exam: {yes no:314532}.  Confirms.              Anus:  normal sphincter tone, no lesions  Chaperone was present for exam:  {BSCHAPERONE:31226::Emily F, CMA}  ASSESSMENT: Well woman visit with gynecologic exam.  PHQ-2-9: ***  ***  PLAN: Mammogram screening discussed. Self breast awareness reviewed. Pap and HRV collected:  {yes no:314532} Guidelines for Calcium, Vitamin D , regular exercise program including cardiovascular and weight bearing exercise. Medication refills:  *** {LABS (Optional):23779} Follow up:  ***    Additional counseling given.  {yes [id number]. ***  total time was spent for this patient encounter, including preparation, face-to-face counseling with the patient, coordination of care, and documentation of the encounter in addition to doing the well woman visit with gynecologic exam.        "

## 2024-01-24 ENCOUNTER — Ambulatory Visit (INDEPENDENT_AMBULATORY_CARE_PROVIDER_SITE_OTHER): Admitting: Obstetrics and Gynecology

## 2024-01-24 ENCOUNTER — Encounter: Payer: Self-pay | Admitting: Obstetrics and Gynecology

## 2024-01-24 VITALS — BP 122/84 | HR 92 | Ht 68.0 in | Wt 362.0 lb

## 2024-01-24 DIAGNOSIS — Z5181 Encounter for therapeutic drug level monitoring: Secondary | ICD-10-CM | POA: Diagnosis not present

## 2024-01-24 DIAGNOSIS — Z1331 Encounter for screening for depression: Secondary | ICD-10-CM | POA: Diagnosis not present

## 2024-01-24 DIAGNOSIS — Z8742 Personal history of other diseases of the female genital tract: Secondary | ICD-10-CM | POA: Diagnosis not present

## 2024-01-24 DIAGNOSIS — Z01419 Encounter for gynecological examination (general) (routine) without abnormal findings: Secondary | ICD-10-CM | POA: Diagnosis not present

## 2024-01-24 MED ORDER — NORETHINDRONE ACETATE 5 MG PO TABS: 10.0000 mg | ORAL_TABLET | Freq: Every day | ORAL | 3 refills | Status: AC

## 2024-01-24 NOTE — Patient Instructions (Signed)

## 2024-02-16 ENCOUNTER — Other Ambulatory Visit: Admitting: Obstetrics and Gynecology

## 2024-02-16 ENCOUNTER — Other Ambulatory Visit

## 2024-05-08 ENCOUNTER — Ambulatory Visit: Admitting: Obstetrics and Gynecology

## 2025-01-31 ENCOUNTER — Ambulatory Visit: Admitting: Obstetrics and Gynecology
